# Patient Record
Sex: Female | Born: 1980 | Race: Black or African American | Hispanic: No | Marital: Married | Smoking: Former smoker
Health system: Southern US, Community
[De-identification: ages and names within clinical notes are randomized; demographics above are authoritative.]

## PROBLEM LIST (undated history)

## (undated) ENCOUNTER — Inpatient Hospital Stay (HOSPITAL_COMMUNITY): Payer: Self-pay

## (undated) DIAGNOSIS — I10 Essential (primary) hypertension: Secondary | ICD-10-CM

## (undated) DIAGNOSIS — A749 Chlamydial infection, unspecified: Secondary | ICD-10-CM

## (undated) DIAGNOSIS — A549 Gonococcal infection, unspecified: Secondary | ICD-10-CM

## (undated) DIAGNOSIS — R51 Headache: Secondary | ICD-10-CM

## (undated) DIAGNOSIS — N83209 Unspecified ovarian cyst, unspecified side: Secondary | ICD-10-CM

## (undated) HISTORY — DX: Headache: R51

## (undated) HISTORY — PX: TUBAL LIGATION: SHX77

## (undated) HISTORY — PX: THERAPEUTIC ABORTION: SHX798

---
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
## 2002-08-27 ENCOUNTER — Emergency Department (HOSPITAL_COMMUNITY): Admission: EM | Admit: 2002-08-27 | Discharge: 2002-08-27 | Payer: Self-pay | Admitting: Emergency Medicine

## 2002-08-27 ENCOUNTER — Encounter: Payer: Self-pay | Admitting: Emergency Medicine

## 2002-11-18 ENCOUNTER — Inpatient Hospital Stay (HOSPITAL_COMMUNITY): Admission: AD | Admit: 2002-11-18 | Discharge: 2002-11-18 | Payer: Self-pay | Admitting: Obstetrics

## 2002-11-18 ENCOUNTER — Encounter: Payer: Self-pay | Admitting: Obstetrics and Gynecology

## 2003-01-11 ENCOUNTER — Ambulatory Visit (HOSPITAL_COMMUNITY): Admission: RE | Admit: 2003-01-11 | Discharge: 2003-01-11 | Payer: Self-pay | Admitting: Obstetrics and Gynecology

## 2003-04-14 ENCOUNTER — Inpatient Hospital Stay (HOSPITAL_COMMUNITY): Admission: AD | Admit: 2003-04-14 | Discharge: 2003-04-14 | Payer: Self-pay | Admitting: Obstetrics

## 2003-05-15 ENCOUNTER — Inpatient Hospital Stay (HOSPITAL_COMMUNITY): Admission: AD | Admit: 2003-05-15 | Discharge: 2003-05-15 | Payer: Self-pay | Admitting: Obstetrics

## 2003-05-22 ENCOUNTER — Observation Stay (HOSPITAL_COMMUNITY): Admission: AD | Admit: 2003-05-22 | Discharge: 2003-05-23 | Payer: Self-pay | Admitting: Obstetrics

## 2003-05-23 ENCOUNTER — Encounter: Payer: Self-pay | Admitting: Obstetrics

## 2003-05-27 ENCOUNTER — Inpatient Hospital Stay (HOSPITAL_COMMUNITY): Admission: AD | Admit: 2003-05-27 | Discharge: 2003-05-27 | Payer: Self-pay | Admitting: Obstetrics

## 2003-05-29 ENCOUNTER — Inpatient Hospital Stay (HOSPITAL_COMMUNITY): Admission: AD | Admit: 2003-05-29 | Discharge: 2003-06-02 | Payer: Self-pay | Admitting: Obstetrics

## 2003-05-30 ENCOUNTER — Encounter (INDEPENDENT_AMBULATORY_CARE_PROVIDER_SITE_OTHER): Payer: Self-pay | Admitting: Specialist

## 2003-12-21 ENCOUNTER — Emergency Department (HOSPITAL_COMMUNITY): Admission: EM | Admit: 2003-12-21 | Discharge: 2003-12-21 | Payer: Self-pay | Admitting: Emergency Medicine

## 2005-01-08 ENCOUNTER — Emergency Department (HOSPITAL_COMMUNITY): Admission: EM | Admit: 2005-01-08 | Discharge: 2005-01-08 | Payer: Self-pay | Admitting: Emergency Medicine

## 2006-01-13 ENCOUNTER — Emergency Department (HOSPITAL_COMMUNITY): Admission: EM | Admit: 2006-01-13 | Discharge: 2006-01-14 | Payer: Self-pay | Admitting: Emergency Medicine

## 2006-05-14 ENCOUNTER — Emergency Department (HOSPITAL_COMMUNITY): Admission: EM | Admit: 2006-05-14 | Discharge: 2006-05-14 | Payer: Self-pay | Admitting: Family Medicine

## 2006-06-07 ENCOUNTER — Inpatient Hospital Stay (HOSPITAL_COMMUNITY): Admission: AD | Admit: 2006-06-07 | Discharge: 2006-06-07 | Payer: Self-pay | Admitting: Obstetrics

## 2006-06-12 ENCOUNTER — Inpatient Hospital Stay (HOSPITAL_COMMUNITY): Admission: AD | Admit: 2006-06-12 | Discharge: 2006-06-12 | Payer: Self-pay | Admitting: Gynecology

## 2006-07-16 ENCOUNTER — Inpatient Hospital Stay (HOSPITAL_COMMUNITY): Admission: AD | Admit: 2006-07-16 | Discharge: 2006-07-16 | Payer: Self-pay | Admitting: Obstetrics

## 2006-07-21 ENCOUNTER — Inpatient Hospital Stay (HOSPITAL_COMMUNITY): Admission: AD | Admit: 2006-07-21 | Discharge: 2006-07-21 | Payer: Self-pay | Admitting: Obstetrics

## 2006-09-27 ENCOUNTER — Inpatient Hospital Stay (HOSPITAL_COMMUNITY): Admission: AD | Admit: 2006-09-27 | Discharge: 2006-09-27 | Payer: Self-pay | Admitting: Obstetrics

## 2006-11-03 ENCOUNTER — Inpatient Hospital Stay (HOSPITAL_COMMUNITY): Admission: AD | Admit: 2006-11-03 | Discharge: 2006-11-03 | Payer: Self-pay | Admitting: Obstetrics

## 2006-11-28 ENCOUNTER — Inpatient Hospital Stay (HOSPITAL_COMMUNITY): Admission: AD | Admit: 2006-11-28 | Discharge: 2006-11-28 | Payer: Self-pay | Admitting: Obstetrics

## 2006-12-19 ENCOUNTER — Inpatient Hospital Stay (HOSPITAL_COMMUNITY): Admission: AD | Admit: 2006-12-19 | Discharge: 2006-12-19 | Payer: Self-pay | Admitting: Obstetrics

## 2006-12-30 ENCOUNTER — Inpatient Hospital Stay (HOSPITAL_COMMUNITY): Admission: AD | Admit: 2006-12-30 | Discharge: 2006-12-30 | Payer: Self-pay | Admitting: Obstetrics

## 2007-01-09 ENCOUNTER — Inpatient Hospital Stay (HOSPITAL_COMMUNITY): Admission: AD | Admit: 2007-01-09 | Discharge: 2007-01-12 | Payer: Self-pay | Admitting: Obstetrics

## 2009-04-03 ENCOUNTER — Inpatient Hospital Stay (HOSPITAL_COMMUNITY): Admission: AD | Admit: 2009-04-03 | Discharge: 2009-04-03 | Payer: Self-pay | Admitting: Obstetrics

## 2009-05-27 ENCOUNTER — Emergency Department (HOSPITAL_COMMUNITY): Admission: EM | Admit: 2009-05-27 | Discharge: 2009-05-28 | Payer: Self-pay | Admitting: Emergency Medicine

## 2009-07-28 ENCOUNTER — Emergency Department (HOSPITAL_COMMUNITY): Admission: EM | Admit: 2009-07-28 | Discharge: 2009-07-28 | Payer: Self-pay | Admitting: Emergency Medicine

## 2009-08-26 ENCOUNTER — Emergency Department (HOSPITAL_COMMUNITY): Admission: EM | Admit: 2009-08-26 | Discharge: 2009-08-26 | Payer: Self-pay | Admitting: Emergency Medicine

## 2009-08-30 ENCOUNTER — Emergency Department (HOSPITAL_COMMUNITY): Admission: EM | Admit: 2009-08-30 | Discharge: 2009-08-31 | Payer: Self-pay | Admitting: Emergency Medicine

## 2009-11-09 ENCOUNTER — Inpatient Hospital Stay (HOSPITAL_COMMUNITY): Admission: AD | Admit: 2009-11-09 | Discharge: 2009-11-09 | Payer: Self-pay | Admitting: Obstetrics

## 2010-02-16 ENCOUNTER — Inpatient Hospital Stay (HOSPITAL_COMMUNITY): Admission: AD | Admit: 2010-02-16 | Discharge: 2010-02-17 | Payer: Self-pay | Admitting: Obstetrics

## 2010-05-24 ENCOUNTER — Inpatient Hospital Stay (HOSPITAL_COMMUNITY): Admission: AD | Admit: 2010-05-24 | Discharge: 2010-05-25 | Payer: Self-pay | Admitting: Obstetrics

## 2010-11-06 ENCOUNTER — Inpatient Hospital Stay (HOSPITAL_COMMUNITY)
Admission: AD | Admit: 2010-11-06 | Discharge: 2010-11-06 | Payer: Self-pay | Source: Home / Self Care | Attending: Obstetrics | Admitting: Obstetrics

## 2010-12-04 ENCOUNTER — Inpatient Hospital Stay (HOSPITAL_COMMUNITY)
Admission: AD | Admit: 2010-12-04 | Discharge: 2010-12-04 | Payer: Self-pay | Source: Home / Self Care | Attending: Obstetrics | Admitting: Obstetrics

## 2010-12-06 LAB — URINALYSIS, ROUTINE W REFLEX MICROSCOPIC
Bilirubin Urine: NEGATIVE
Hgb urine dipstick: NEGATIVE
Ketones, ur: NEGATIVE mg/dL
Nitrite: NEGATIVE
Protein, ur: NEGATIVE mg/dL
Specific Gravity, Urine: >1.03 — ABNORMAL HIGH (ref 1.005–1.030)
Urine Glucose, Fasting: NEGATIVE mg/dL
Urobilinogen, UA: 0.2 mg/dL (ref 0.0–1.0)
pH: 5.5 (ref 5.0–8.0)

## 2010-12-06 LAB — WET PREP, GENITAL
Clue Cells Wet Prep HPF POC: NONE SEEN
Trich, Wet Prep: NONE SEEN

## 2010-12-06 LAB — GC/CHLAMYDIA PROBE AMP, GENITAL
Chlamydia, DNA Probe: NEGATIVE
GC Probe Amp, Genital: NEGATIVE

## 2010-12-06 LAB — POCT PREGNANCY, URINE: Preg Test, Ur: NEGATIVE

## 2010-12-25 ENCOUNTER — Emergency Department (HOSPITAL_COMMUNITY)
Admission: EM | Admit: 2010-12-25 | Discharge: 2010-12-25 | Disposition: A | Discharge status: Home / Self Care | Payer: Medicaid Other | Attending: Emergency Medicine | Admitting: Emergency Medicine

## 2010-12-25 DIAGNOSIS — R05 Cough: Secondary | ICD-10-CM | POA: Insufficient documentation

## 2010-12-25 DIAGNOSIS — R059 Cough, unspecified: Secondary | ICD-10-CM | POA: Insufficient documentation

## 2010-12-25 DIAGNOSIS — J029 Acute pharyngitis, unspecified: Secondary | ICD-10-CM | POA: Insufficient documentation

## 2010-12-25 DIAGNOSIS — J3489 Other specified disorders of nose and nasal sinuses: Secondary | ICD-10-CM | POA: Insufficient documentation

## 2010-12-25 LAB — RAPID STREP SCREEN (MED CTR MEBANE ONLY): Streptococcus, Group A Screen (Direct): NEGATIVE

## 2010-12-30 ENCOUNTER — Inpatient Hospital Stay (HOSPITAL_COMMUNITY)
Admission: AD | Admit: 2010-12-30 | Discharge: 2010-12-30 | Disposition: A | Discharge status: Home / Self Care | Payer: Medicaid Other | Source: Ambulatory Visit | Attending: Obstetrics | Admitting: Obstetrics

## 2010-12-30 DIAGNOSIS — B373 Candidiasis of vulva and vagina: Secondary | ICD-10-CM

## 2010-12-30 DIAGNOSIS — B3731 Acute candidiasis of vulva and vagina: Secondary | ICD-10-CM | POA: Insufficient documentation

## 2010-12-30 DIAGNOSIS — N949 Unspecified condition associated with female genital organs and menstrual cycle: Secondary | ICD-10-CM

## 2010-12-30 LAB — URINALYSIS, ROUTINE W REFLEX MICROSCOPIC
Bilirubin Urine: NEGATIVE
Hgb urine dipstick: NEGATIVE
Ketones, ur: NEGATIVE mg/dL
Nitrite: NEGATIVE
Protein, ur: NEGATIVE mg/dL
Specific Gravity, Urine: >1.03 — ABNORMAL HIGH (ref 1.005–1.030)
Urine Glucose, Fasting: NEGATIVE mg/dL
Urobilinogen, UA: 0.2 mg/dL (ref 0.0–1.0)
pH: 6 (ref 5.0–8.0)

## 2010-12-30 LAB — WET PREP, GENITAL
Trich, Wet Prep: NONE SEEN
Yeast Wet Prep HPF POC: NONE SEEN

## 2010-12-30 LAB — POCT PREGNANCY, URINE: Preg Test, Ur: NEGATIVE

## 2011-01-02 LAB — GC/CHLAMYDIA PROBE AMP, GENITAL
Chlamydia, DNA Probe: NEGATIVE
GC Probe Amp, Genital: NEGATIVE

## 2011-01-29 LAB — URINALYSIS, ROUTINE W REFLEX MICROSCOPIC
Bilirubin Urine: NEGATIVE
Glucose, UA: NEGATIVE mg/dL
Hgb urine dipstick: NEGATIVE
Ketones, ur: NEGATIVE mg/dL
Nitrite: NEGATIVE
Protein, ur: NEGATIVE mg/dL
Specific Gravity, Urine: >1.03 — ABNORMAL HIGH (ref 1.005–1.030)
Urobilinogen, UA: 0.2 mg/dL (ref 0.0–1.0)
pH: 6 (ref 5.0–8.0)

## 2011-01-29 LAB — POCT PREGNANCY, URINE: Preg Test, Ur: NEGATIVE

## 2011-01-29 LAB — GC/CHLAMYDIA PROBE AMP, GENITAL
Chlamydia, DNA Probe: NEGATIVE
GC Probe Amp, Genital: NEGATIVE

## 2011-01-29 LAB — WET PREP, GENITAL
Clue Cells Wet Prep HPF POC: NONE SEEN
Trich, Wet Prep: NONE SEEN

## 2011-02-04 LAB — URINALYSIS, ROUTINE W REFLEX MICROSCOPIC
Bilirubin Urine: NEGATIVE
Ketones, ur: NEGATIVE mg/dL
Nitrite: NEGATIVE
Urobilinogen, UA: 0.2 mg/dL (ref 0.0–1.0)
pH: 6 (ref 5.0–8.0)

## 2011-02-09 LAB — GC/CHLAMYDIA PROBE AMP, GENITAL: Chlamydia, DNA Probe: NEGATIVE

## 2011-02-09 LAB — CBC
HCT: 41.1 % (ref 36.0–46.0)
Hemoglobin: 13.7 g/dL (ref 12.0–15.0)
RBC: 4.75 MIL/uL (ref 3.87–5.11)
RDW: 13.9 % (ref 11.5–15.5)
WBC: 11 10*3/uL — ABNORMAL HIGH (ref 4.0–10.5)

## 2011-02-09 LAB — WET PREP, GENITAL
Trich, Wet Prep: NONE SEEN
Yeast Wet Prep HPF POC: NONE SEEN

## 2011-02-09 LAB — URINALYSIS, ROUTINE W REFLEX MICROSCOPIC
Glucose, UA: NEGATIVE mg/dL
Hgb urine dipstick: NEGATIVE
Specific Gravity, Urine: 1.02 (ref 1.005–1.030)
pH: 7 (ref 5.0–8.0)

## 2011-02-09 LAB — POCT PREGNANCY, URINE: Preg Test, Ur: NEGATIVE

## 2011-02-19 LAB — COMPREHENSIVE METABOLIC PANEL
CO2: 29 mEq/L (ref 19–32)
Calcium: 9.2 mg/dL (ref 8.4–10.5)
Creatinine, Ser: 0.81 mg/dL (ref 0.4–1.2)
GFR calc Af Amer: >60 mL/min (ref >60)
GFR calc non Af Amer: >60 mL/min (ref >60)
Glucose, Bld: 102 mg/dL — ABNORMAL HIGH (ref 70–99)

## 2011-02-19 LAB — CBC
MCHC: 34 g/dL (ref 30.0–36.0)
MCV: 86.1 fL (ref 78.0–100.0)
RBC: 4.55 MIL/uL (ref 3.87–5.11)
RDW: 14 % (ref 11.5–15.5)

## 2011-02-19 LAB — WET PREP, GENITAL
Trich, Wet Prep: NONE SEEN
Yeast Wet Prep HPF POC: NONE SEEN

## 2011-02-19 LAB — GC/CHLAMYDIA PROBE AMP, GENITAL: GC Probe Amp, Genital: NEGATIVE

## 2011-02-19 LAB — URINALYSIS, ROUTINE W REFLEX MICROSCOPIC
Bilirubin Urine: NEGATIVE
Ketones, ur: NEGATIVE mg/dL
Nitrite: NEGATIVE
Protein, ur: NEGATIVE mg/dL
pH: 5 (ref 5.0–8.0)

## 2011-02-23 LAB — URINALYSIS, ROUTINE W REFLEX MICROSCOPIC
Bilirubin Urine: NEGATIVE
Glucose, UA: NEGATIVE mg/dL
Hgb urine dipstick: NEGATIVE
Ketones, ur: NEGATIVE mg/dL
pH: 7 (ref 5.0–8.0)

## 2011-02-27 LAB — CBC
Hemoglobin: 14.4 g/dL (ref 12.0–15.0)
MCHC: 34.9 g/dL (ref 30.0–36.0)
RDW: 14.4 % (ref 11.5–15.5)

## 2011-02-27 LAB — DIFFERENTIAL
Basophils Absolute: 0 10*3/uL (ref 0.0–0.1)
Basophils Relative: 0 % (ref 0–1)
Monocytes Absolute: 0.9 10*3/uL (ref 0.1–1.0)
Neutro Abs: 6.6 10*3/uL (ref 1.7–7.7)

## 2011-02-27 LAB — URINALYSIS, ROUTINE W REFLEX MICROSCOPIC
Glucose, UA: NEGATIVE mg/dL
Hgb urine dipstick: NEGATIVE
Specific Gravity, Urine: 1.01 (ref 1.005–1.030)
Urobilinogen, UA: 0.2 mg/dL (ref 0.0–1.0)

## 2011-02-27 LAB — GC/CHLAMYDIA PROBE AMP, GENITAL: GC Probe Amp, Genital: NEGATIVE

## 2011-02-27 LAB — WET PREP, GENITAL
Trich, Wet Prep: NONE SEEN
Yeast Wet Prep HPF POC: NONE SEEN

## 2011-04-06 NOTE — Op Note (Signed)
[facility]  Patient:    Carmen Watts, Carmen Watts Visit Number: [id number] MRN: [id number]          Service Type: Attending:  Kathreen Cosier, M.D. Dictated by:   Kathreen Cosier, M.D. Proc. Date: 11/11/01                             Operative Report  PREOPERATIVE DIAGNOSES:       1.Prolonged ruptured membranes.                               2. Failed Pitocin induction.  POSTOPERATIVE DIAGNOSIS:  OPERATION:                    Low transverse cesarean section.  SURGEON:                      Kathreen Cosier, M.D.  ANESTHESIA:                   Spinal.  DESCRIPTION OF PROCEDURE:     The patient was placed on the operating table in supine position. The abdomen was prepped and draped and the bladder emptied with a Foley catheter. A transverse suprapubic incision was made and carried down to the rectus fascia. The fascia was cleanly incised the length of the incision. The rectus muscles were retracted laterally. The peritoneum was incised longitudinally. A transverse incision was made in the visceral peritoneum above the bladder and the bladder mobilized inferiorly. A transverse lower uterine incision was made. The patient delivered from the LOA position of a female, Apgars 8/9, weighing 6 pounds 9 ounces. The fluid was clear. The placenta was posterior and removed manually. The uterine cavity was cleaned with dry laps. The uterine incision was closed in one layer with continuous suture of #1 Dexon. The bladder flap was reattached with 2-0 chromic. The uterus was well contracted. Tubes and ovaries were normal. The abdomen was closed in layers: the peritoneum with continuous suture of 0 chromic; the fascia with continuous suture of 0 Dexon, and the skin closed with subcuticular stitches of 3-0 plain. The patient tolerated the procedure well and was taken to the recovery room in good condition. Dictated by:   Kathreen Cosier, M.D. Attending:   Kathreen Cosier, M.D. DD:  11/11/01 TD:  11/12/01 Job: [id number]

## 2011-04-06 NOTE — H&P (Signed)
   NAME:  Carmen Watts, Carmen Watts                           ACCOUNT NO.:  [id number]   MEDICAL RECORD NO.:  [id number]                   PATIENT TYPE:  INP   LOCATION:  9144                                 [facility]ILITY:  [facility]   PHYSICIAN:  Kathreen Cosier, M.D.           DATE OF BIRTH:  [birth year]   DATE OF ADMISSION:  05/29/2003  DATE OF DISCHARGE:                                HISTORY & PHYSICAL   HISTORY OF PRESENT ILLNESS:  The patient is a 30 year old gravida 2, para 1-  0-0-1 had a previous C-section with CPD.  Her EDC was July 13th to June 08, 2003.  The patient was admitted in early labor, cervix was 2 cm, 70%,  vertex, minus 2, negative GBS.  Her membranes were ruptured at 11:05 p.m.  No fluid obtained.  IUPC was inserted.  She was started on a low dose of  Pitocin.  The cervix was 2 cm, 80%, vertex, minus __________.  By 3 a.m. on  July 11th was still contracting every 1 to 3 minutes with Pitocin.  The  patient said she had a headache, that her epidural did not work and demanded  a repeat C-section.  So it was decided should perform a repeat C-section at  the patient's request.   PHYSICAL EXAMINATION:  HEENT:  Negative.  LUNGS:  Clear.  HEART:  Regular rhythm, no murmurs, no gallops.  ABDOMEN:  Term sized uterus.  Estimated fetal weight 7 pounds.  PELVIC:  Cervix as described above.  EXTREMITIES:  Negative.                                               Kathreen Cosier, M.D.    BAM/MEDQ  D:  05/30/2003  T:  05/30/2003  Job:  [id number]

## 2011-04-06 NOTE — Op Note (Signed)
   Carmen Watts, Carmen Watts                           ACCOUNT NO.:  [id number]   MEDICAL RECORD NO.:  [id number]                   PATIENT TYPE:  INP   LOCATION:  9144                                 [facility]  [facility]   PHYSICIAN:  Kathreen Cosier, M.D.           DATE OF BIRTH:  24-Sep-1981   DATE OF PROCEDURE:  05/30/2003  DATE OF DISCHARGE:                                 OPERATIVE REPORT   PREOPERATIVE DIAGNOSES:  1. Previous cesarean section at term.  2. Active labor.  3. Patient with ________.   ANESTHESIA:  Spinal.   SURGEON:  Kathreen Cosier, M.D.   PROCEDURE:  The patient is on the operating table in the supine position  after the spinal administered.  Abdomen prepped and draped, bladder emptied  with a Foley catheter.  A transverse suprapubic incision made.  This was  carried down to the rectus fascia.  The fascia is cleaned and incised the  length of the incision, the recti muscles retracted laterally.  The  peritoneum was incised longitudinally.  A transverse incision made on the  visceral peritoneum above the bladder and the bladder mobilized inferiorly.  A transverse lower uterine incision made.  Fluid was clear.  The patient was  delivered from the OP position of a female, Apgars 8 and 9, at 3:20 a.m.  weighing 6 pounds 8 ounces.  The placenta was anterior, removed manually.  The uterine cavity cleaned with dry laps.  Uterine incision closed in one  layer with a continuous suture of #1 chromic.  The bladder flap reattached  with 2-0 chromic.  Uterus well-contracted.  Tubes and ovaries normal.  Abdomen closed in layers, the perineum with a continuous suture of 0  chromic, fascia with a continuous suture of 0 Dexon, skin closed with  subcuticular stitch of 3-0 plain.  Blood loss was 600 mL.                                               Kathreen Cosier, M.D.    BAM/MEDQ  D:  05/30/2003  T:  05/31/2003  Job:  [id number]

## 2011-04-06 NOTE — Discharge Summary (Signed)
[facility]  Patient:    Carmen Watts, Carmen Watts Visit Number: [id number] MRN: [id number]          Service Type: OBS Location: 910A 9135 01 Attending Physician:  Venita Sheffield Dictated by:   Kathreen Cosier, M.D. Admit Date:  11/10/2001 Discharge Date: 11/14/2001                             Discharge Summary  HISTORY OF PRESENT ILLNESS:   The patient is an 30 year old primigravida, [facility] November 15, 2001, with ruptured membranes which occurred at 7:30 p.m. on the night of November 10, 2001.  Fluid was clear on the morning of November 11, 2001.  She was 2 cm, 60% vertex, -2 station.  She had been on Pitocin from admission the night prior to the 24th.  Throughout the course of the day, the patient had her Pitocin advanced and she was having mild contractions which she did not feel.  She was up to 32 mg of Pitocin by 10 p.m. on September 11, 2001 with prolonged ruptured membranes, and not in labor.  Cervix was unchanged.  The contractions were mild.  The patient did not feel them.  She was delivered by a low transverse cesarean section because of prolonged ruptured membranes and failed Pitocin induction.  She had a 6 pound 9 ounce.  Apgars were 8 and 9.  On admission, her hemoglobin was 11.6.  Postoperatively, it was 10.2.  She was discharged home on the third postoperative day and returned regular time with Tylox 1 p.o. q.4h. p.r.n. and ferrous sulfate 325 mg.  She is to see me in six weeks.  DISCHARGE DIAGNOSIS:                        Status post primary low transverse cesarean section. with a prolonged ruptured membranes and failed induction of labor. Dictated by:   Kathreen Cosier, M.D. Attending Physician:  Venita Sheffield DD:  11/14/01 TD:  11/14/01 Job: [id number]

## 2011-04-06 NOTE — Discharge Summary (Signed)
NAMEKIERSTYN, BARANOWSKI                 ACCOUNT NO.:  [id number]   MEDICAL RECORD NO.:  [id number]          PATIENT TYPE:  INP   LOCATION:  9132                          [facility]ILITY:  [facility]   PHYSICIAN:  Kathreen Cosier, M.D.DATE OF BIRTH:  [birth year]   DATE OF ADMISSION:  01/09/2007  DATE OF DISCHARGE:  01/12/2007                               DISCHARGE SUMMARY   HISTORY OF PRESENT ILLNESS:  The patient is a 30 year old gravida 3,  para 2, EDC January 17, 2007.  She had two previous C-sections.  She  was in for a repeat C-section at term.  She had a history of premature  contractions and was treated with Procardia 60 XL.  The patient  underwent repeat low transverse cesarean section.  She had a female,  Apgars 9 and 9, weight being 7 pounds 2 ounces.  Postoperatively, she  did well.   ADMISSION LABORATORY DATA:  Hemoglobin 11.9, platelets 225, postop  hemoglobin 10, platelets 209, PT/PTT normal.  Sodium 132, potassium 3.5,  chloride 101.   DISPOSITION:  She was discharged home on postop day #3 and to return  back to see me in six weeks.   DISCHARGE DIAGNOSES:  Repeat low transverse cesarean section at term.           ______________________________  Kathreen Cosier, M.D.     BAM/MEDQ  D:  01/29/2007  T:  01/30/2007  Job:  [id number]

## 2011-04-06 NOTE — Discharge Summary (Signed)
   [name], Carmen Watts                           ACCOUNT NO.:  [id number]   MEDICAL RECORD NO.:  [id number]                   PATIENT TYPE:  INP   LOCATION:  9144                                 [facility]   PHYSICIAN:  Kathreen Cosier, M.D.           DATE OF BIRTH:  17-Oct-1981   DATE OF ADMISSION:  05/29/2003  DATE OF DISCHARGE:  06/02/2003                                 DISCHARGE SUMMARY   HISTORY AND PHYSICAL:  The patient is a 30 year old gravida 2, para 1-0-0-1  with a cesarean section in the past with CPD.  Her EDC was June 01, 2003 to  June 10, 2003.  She had been contracting in prodromal labor for the past  week.  She was admitted 2 cm, 70%, vertex -2 to -3.  Hemoglobin 11.4,  platelets 254.  Membranes artificially ruptured at 11 p.m., and IUPC  inserted.  The patient was started on low-dose Pitocin by 3, and the patient  had had her epidural; however, she was still 2 cm.  The patient stated that  she wanted a repeat cesarean section, she did not want to labor, so she had  a spinal and a repeat low transverse cesarean section.  She had a female  from the OP position, Apgars 8/9, weighing 6 pounds, 8 ounces.  Postoperative hemoglobin was 9.9.  Urine was negative.  She did well and was  discharged on the third postoperative day, ambulatory, on a regular diet.   FOLLOW UP:  She is to see me in six weeks.   DISCHARGE MEDICATIONS:  1. Tylenol No. 3 for pain.  2. Yasmin for contraception.   DISCHARGE DIAGNOSES:  Status post repeat low transverse cesarean section at  term at the patient's request.                                               Kathreen Cosier, M.D.    BAM/MEDQ  D:  06/02/2003  T:  06/02/2003  Job:  [id number]

## 2011-04-06 NOTE — H&P (Signed)
[facility]  Patient:    Carmen Watts, Carmen Watts Visit Number: [id number] MRN: [id number]          Service Type: OBS Location: 910A 9135 01 Attending Physician:  Venita Sheffield Dictated by:   Kathreen Cosier, M.D. Proc. Date: 11/10/01 Admit Date:  11/10/2001                           History and Physical  HISTORY OF PRESENT ILLNESS:   The patient is an 30 year old primigravida, EDC of November 15, 2001 with ruptured membranes which occurred at 7:30 p.m. on November 10, 2001.  HOSPITAL COURSE:              In the a.m. of November 11, 2001 the cervix was 2 cm, 60%, vertex, 0 station. She was already on 20 milliunits of Pitocin. Throughout the course of the day and into the night of December 24, she was on 32 milliunits of Pitocin and contracting two to three minutes but they were ineffective and by 10 p.m. on December 24, she was still 2 cm. It was decided that she would deliver by cesarean section because of prolonged ruptured membranes greater than 24 hours and failed induction of labor.  PHYSICAL EXAMINATION:  GENERAL:  Well-developed female in no distress.  HEENT:  Negative.  LUNGS:  Clear.  HEART:  Regular rate with no murmurs or gallops.  ABDOMEN:  Term size uterus. Estimated fetal weight of 7 pounds.  EXTREMITIES:  As described above. Dictated by:   Kathreen Cosier, M.D. Attending Physician:  Venita Sheffield DD:  11/11/01 TD:  11/12/01 Job: [id number]

## 2011-04-06 NOTE — Op Note (Signed)
NAMEESTORIA, Carmen Watts                 ACCOUNT NO.:  [id number]   MEDICAL RECORD NO.:  [id number]          PATIENT TYPE:  INP   LOCATION:  NA                            [facility]ILITY:  [facility]   PHYSICIAN:  Kathreen Cosier, M.D.DATE OF BIRTH:  1981/06/27   DATE OF PROCEDURE:  01/09/2007  DATE OF DISCHARGE:                               OPERATIVE REPORT   PREOPERATIVE DIAGNOSES:  1. Previous cesarean section.  2. At term, for repeat cesarean section.   SURGEON:  Kathreen Cosier, M.D.   FIRST ASSISTANT:  Charles A. Clearance Coots, M.D.   ANESTHESIA:  Spinal.   DESCRIPTION OF PROCEDURE:  The patient was placed on the operating table  in the supine position after the spinal was administered, abdomen  prepped and draped, bladder emptied with a Foley catheter.  A transverse  suprapubic incision was made through the old scar and carried down to  the rectus fascia, fascia cleaned and incised the length of the  incision, rectus muscles retracted laterally, peritoneum incised  longitudinally.  Transverse incision was made in the visceroperitoneum  above the bladder and bladder mobilized inferiorly.  A transverse lower  uterine incision was made.  Fluid was clear.  The patient was delivered  from LOA position of a female, Apgars 9/9, weighing 7 pounds 2 ounces.  Team was in attendance.  The placenta was anterior, removed manually and  sent to Labor and Delivery.  Uterine cavity was cleaned with dry laps  and uterine incision closed in 1 layer with continuous suture of #1  chromic, hemostasis satisfactory.  Bladder flap was reattached with 2-0  chromic.  Uterus was well-contracted.  Tubes and ovaries were normal.  The abdomen was closed in layers, the peritoneum with a continuous  suture of 0 chromic, fascia with a continuous suture of 0 Dexon and the  skin closed with a subcuticular stitch of 4-0 Monocryl.   BLOOD LOSS:  500 mL.           ______________________________  Kathreen Cosier,  M.D.     BAM/MEDQ  D:  01/09/2007  T:  01/09/2007  Job:  [id number]

## 2011-05-14 ENCOUNTER — Emergency Department (HOSPITAL_COMMUNITY): Payer: Medicaid Other

## 2011-05-14 ENCOUNTER — Emergency Department (HOSPITAL_COMMUNITY)
Admission: EM | Admit: 2011-05-14 | Discharge: 2011-05-14 | Disposition: A | Discharge status: Home / Self Care | Payer: Medicaid Other | Attending: Emergency Medicine | Admitting: Emergency Medicine

## 2011-05-14 DIAGNOSIS — Y92410 Unspecified street and highway as the place of occurrence of the external cause: Secondary | ICD-10-CM | POA: Insufficient documentation

## 2011-05-14 DIAGNOSIS — S7000XA Contusion of unspecified hip, initial encounter: Secondary | ICD-10-CM | POA: Insufficient documentation

## 2011-05-14 DIAGNOSIS — M25559 Pain in unspecified hip: Secondary | ICD-10-CM | POA: Insufficient documentation

## 2011-05-14 DIAGNOSIS — T148XXA Other injury of unspecified body region, initial encounter: Secondary | ICD-10-CM | POA: Insufficient documentation

## 2011-05-14 LAB — URINALYSIS, ROUTINE W REFLEX MICROSCOPIC
Bilirubin Urine: NEGATIVE
Hgb urine dipstick: NEGATIVE
Specific Gravity, Urine: 1.01 (ref 1.005–1.030)
Urobilinogen, UA: 1 mg/dL (ref 0.0–1.0)
pH: 6.5 (ref 5.0–8.0)

## 2011-06-29 ENCOUNTER — Emergency Department (HOSPITAL_COMMUNITY)
Admission: EM | Admit: 2011-06-29 | Discharge: 2011-06-29 | Disposition: A | Discharge status: Home / Self Care | Payer: Medicaid Other | Attending: Emergency Medicine | Admitting: Emergency Medicine

## 2011-06-29 ENCOUNTER — Emergency Department (HOSPITAL_COMMUNITY): Payer: Medicaid Other

## 2011-06-29 DIAGNOSIS — M545 Low back pain, unspecified: Secondary | ICD-10-CM | POA: Insufficient documentation

## 2011-06-29 DIAGNOSIS — J45909 Unspecified asthma, uncomplicated: Secondary | ICD-10-CM | POA: Insufficient documentation

## 2011-08-06 ENCOUNTER — Encounter (HOSPITAL_COMMUNITY): Payer: Self-pay

## 2011-08-06 ENCOUNTER — Inpatient Hospital Stay (HOSPITAL_COMMUNITY)
Admission: AD | Admit: 2011-08-06 | Discharge: 2011-08-06 | Disposition: A | Discharge status: Home / Self Care | Payer: Self-pay | Source: Ambulatory Visit | Attending: Obstetrics & Gynecology | Admitting: Obstetrics & Gynecology

## 2011-08-06 DIAGNOSIS — B3731 Acute candidiasis of vulva and vagina: Secondary | ICD-10-CM

## 2011-08-06 DIAGNOSIS — R7302 Impaired glucose tolerance (oral): Secondary | ICD-10-CM

## 2011-08-06 DIAGNOSIS — B373 Candidiasis of vulva and vagina: Secondary | ICD-10-CM

## 2011-08-06 DIAGNOSIS — E739 Lactose intolerance, unspecified: Secondary | ICD-10-CM | POA: Insufficient documentation

## 2011-08-06 HISTORY — DX: Unspecified ovarian cyst, unspecified side: N83.209

## 2011-08-06 HISTORY — DX: Gonococcal infection, unspecified: A54.9

## 2011-08-06 LAB — WET PREP, GENITAL: Trich, Wet Prep: NONE SEEN

## 2011-08-06 LAB — POCT PREGNANCY, URINE: Preg Test, Ur: NEGATIVE

## 2011-08-06 MED ORDER — FLUCONAZOLE 150 MG PO TABS: 150.0000 mg | ORAL_TABLET | Freq: Once | ORAL | Status: AC

## 2011-08-06 NOTE — Progress Notes (Signed)
Left lower abd pain, possible yeast infection, 6 month ago had same and here in [facility]U several times for Rx. Terconazole cream and 4 pills

## 2011-08-06 NOTE — ED Provider Notes (Signed)
Carmen Watts is a 30 y.o. [phone]  Chief Complaint  Patient presents with  . Vaginal Discharge  . Vaginal Itching    with rash  history of present illness:  She reports several days of thick white vaginal discharge and vulvovaginal pleuritis. She used 2 tubes of Monistat 7 without improvement. She has had several yeast infections in the past.  The last time it took multiple doses of Diflucan to clear it up.She has never been tested for diabetes and is fasting since last evening. She also reports some mild left lower quadrant pain has been on-and-off for 3 days but is not present now. Past Medical History  Diagnosis Date  . Asthma   . Ovarian cyst   . Gonorrhea    Past Surgical History  Procedure Date  . Cesarean section    History   Social History  . Marital Status: Single    Spouse Name: N/A    Number of Children: N/A  . Years of Education: N/A   Occupational History  . Not on file.   Social History Main Topics  . Smoking status: Never Smoker   . Smokeless tobacco: Not on file  . Alcohol Use: Yes  . Drug Use: No  . Sexually Active: Yes   Other Topics Concern  . Not on file   Social History Narrative  . No narrative on file   Results for orders placed during the hospital encounter of 08/06/11 (from the past 24 hour(s))  POCT PREGNANCY, URINE     Status: Normal   Collection Time   08/06/11  9:51 AM      Component Value Range   Preg Test, Ur NEGATIVE    GLUCOSE, CAPILLARY     Status: Abnormal   Collection Time   08/06/11 10:17 AM      Component Value Range   Glucose-Capillary 128 (*) 70 - 99 (mg/dL)  Microscopic wet-mount exam shows mod yeast

## 2011-08-07 LAB — GC/CHLAMYDIA PROBE AMP, GENITAL: GC Probe Amp, Genital: NEGATIVE

## 2011-08-10 NOTE — ED Provider Notes (Signed)
Agree with above note.  Belkys Henault H. 08/10/2011 8:29 AM

## 2011-10-17 ENCOUNTER — Ambulatory Visit (INDEPENDENT_AMBULATORY_CARE_PROVIDER_SITE_OTHER): Payer: No Typology Code available for payment source | Admitting: Internal Medicine

## 2011-10-17 ENCOUNTER — Encounter: Payer: Self-pay | Admitting: Internal Medicine

## 2011-10-17 VITALS — BP 120/72 | HR 98 | Temp 98.3°F | Wt 261.7 lb

## 2011-10-17 DIAGNOSIS — R7302 Impaired glucose tolerance (oral): Secondary | ICD-10-CM | POA: Insufficient documentation

## 2011-10-17 DIAGNOSIS — R739 Hyperglycemia, unspecified: Secondary | ICD-10-CM

## 2011-10-17 DIAGNOSIS — R7309 Other abnormal glucose: Secondary | ICD-10-CM

## 2011-10-17 DIAGNOSIS — IMO0002 Reserved for concepts with insufficient information to code with codable children: Secondary | ICD-10-CM

## 2011-10-17 DIAGNOSIS — M792 Neuralgia and neuritis, unspecified: Secondary | ICD-10-CM | POA: Insufficient documentation

## 2011-10-17 LAB — LIPID PANEL
HDL: 51 mg/dL (ref >39)
Total CHOL/HDL Ratio: 3.3 Ratio
Triglycerides: 151 mg/dL — ABNORMAL HIGH (ref <150)

## 2011-10-17 LAB — POCT URINALYSIS DIPSTICK
Bilirubin, UA: NEGATIVE
Blood, UA: NEGATIVE
Glucose, UA: NEGATIVE
Spec Grav, UA: 1.02
pH, UA: 7

## 2011-10-17 LAB — POCT GLYCOSYLATED HEMOGLOBIN (HGB A1C): Hemoglobin A1C: 6.5

## 2011-10-17 LAB — TSH: TSH: 1.831 u[IU]/mL (ref 0.350–4.500)

## 2011-10-17 LAB — GLUCOSE, CAPILLARY: Glucose-Capillary: 122 mg/dL — ABNORMAL HIGH (ref 70–99)

## 2011-10-17 NOTE — Assessment & Plan Note (Addendum)
Hemoglobin 6.5 today with CBG equal to 122. Will refer to diabetic educator for nutritional education and defer medication at this point. We'll also check a lipid panel and TSH today given the patient's complaints of paresthesias and obesity. Polycystic ovarian syndrome less likely given patient physical exam and lack of hirsutism, in addition to having 3 children 59, 44, and 28 years old. Patient will follow up in 3 months.

## 2011-10-17 NOTE — Progress Notes (Signed)
Addended by: Maura Crandall on: 10/17/2011 05:04 PM   Modules accepted: Orders

## 2011-10-17 NOTE — Progress Notes (Signed)
  Subjective:    Patient ID: Carmen Watts, female    DOB: [birth year], 30 y.o.   MRN: [id number]  HPI  Reports increased thirst, urination for the past "few months" associated with recurrent yeast infection.  Seen at [facility] where per pt report "sugars over 200 two times" thus presents for further evaluation.  Denies weight loss but reports fatigue, tingling in hands and feet. CBG today 122.  Last CBG 08/06/2011 128.  Review of Systems  Constitutional: Negative for fever, activity change, appetite change and unexpected weight change.  HENT: Negative.   Eyes: Positive for visual disturbance.       Spots in front of her eyes 2-3 times a week, last seen by eye doc 2 years ago   Respiratory: Negative for shortness of breath.   Cardiovascular: Negative for chest pain.  Gastrointestinal: Negative for abdominal distention.  Genitourinary: Negative for dysuria, hematuria, vaginal bleeding and vaginal discharge.  Musculoskeletal: Negative for back pain and joint swelling.  Neurological: Positive for light-headedness and numbness. Negative for dizziness, syncope and weakness.       Migraines 2-3 times a month managed with Excedrin or Advil  Migraine; reports occasional numbness and tingling in her fingertips and feet  Psychiatric/Behavioral: Positive for confusion. Negative for dysphoric mood and agitation.       Objective:   Physical Exam  Constitutional: She is oriented to person, place, and time. She appears well-developed and well-nourished. No distress.       obese  HENT:  Head: Normocephalic and atraumatic.  Eyes: EOM are normal. Pupils are equal, round, and reactive to light.  Neck: Normal range of motion. Neck supple. No thyromegaly present.  Cardiovascular: Normal rate, regular rhythm, normal heart sounds and intact distal pulses.   Pulmonary/Chest: Effort normal and breath sounds normal.  Abdominal: Soft. Bowel sounds are normal. She exhibits mass. There is no tenderness.     obese  Musculoskeletal: Normal range of motion. She exhibits no edema.  Neurological: She is alert and oriented to person, place, and time.  Skin: Skin is warm and dry.       No striae, no hirsutism noted  Psychiatric: She has a normal mood and affect. Her behavior is normal. Judgment and thought content normal.          Assessment & Plan:  #1 impaired glucose tolerance: Will refer to diabetic educator for nutritional management and defer medication at this time. Plan to check a lipid panel and TSH today. Patient reports that her eye doctor is at [facility] and will be making an appointment for eye exam. Patient to followup in clinic in 3 months.

## 2011-10-17 NOTE — Assessment & Plan Note (Signed)
Sensation and gross motor function intact. Likely secondary to hyperglycemia. We'll check TSH today given mother's history of Graves' disease.

## 2011-10-17 NOTE — Patient Instructions (Signed)
It was a pleasure to meet you today, Carmen Watts. You have impaired glucose tolerance/pre--diabetes. A diabetic educator will be contacting you for an appointment.I will defer medication at this time and check your cholesterol and thyroid function with lab work. Return to see me in about 3 months so that we may evaluate your lab work and see how you've been doing after working with the diabetic educator.

## 2011-11-14 ENCOUNTER — Encounter (HOSPITAL_COMMUNITY): Payer: Self-pay | Admitting: Emergency Medicine

## 2011-11-14 ENCOUNTER — Emergency Department (HOSPITAL_COMMUNITY)
Admission: EM | Admit: 2011-11-14 | Discharge: 2011-11-14 | Disposition: A | Discharge status: Home / Self Care | Payer: Medicaid Other | Attending: Emergency Medicine | Admitting: Emergency Medicine

## 2011-11-14 ENCOUNTER — Emergency Department (HOSPITAL_COMMUNITY): Payer: Medicaid Other

## 2011-11-14 DIAGNOSIS — S8990XA Unspecified injury of unspecified lower leg, initial encounter: Secondary | ICD-10-CM | POA: Insufficient documentation

## 2011-11-14 DIAGNOSIS — M25569 Pain in unspecified knee: Secondary | ICD-10-CM | POA: Insufficient documentation

## 2011-11-14 DIAGNOSIS — S99929A Unspecified injury of unspecified foot, initial encounter: Secondary | ICD-10-CM | POA: Insufficient documentation

## 2011-11-14 DIAGNOSIS — X500XXA Overexertion from strenuous movement or load, initial encounter: Secondary | ICD-10-CM | POA: Insufficient documentation

## 2011-11-14 MED ORDER — OXYCODONE-ACETAMINOPHEN 5-325 MG PO TABS
1.0000 | ORAL_TABLET | Freq: Once | ORAL | Status: AC
  Administered 2011-11-14: 1 via ORAL
  Filled 2011-11-14: qty 1

## 2011-11-14 MED ORDER — OXYCODONE-ACETAMINOPHEN 5-325 MG PO TABS: 1.0000 | ORAL_TABLET | Freq: Four times a day (QID) | ORAL | Status: AC | PRN
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
## 2011-11-14 NOTE — ED Notes (Signed)
Pt presented to the ER with c/o left knee pain, states that started around 2000 last night, pt further explains that she fell on the couch and the she "heard the pop" in the left knee, pt also explaining that she was trying to wait ans see if the pain will subside however s/s are getting worse. Notes minimal swelling to the left knee,also feel warmer to the touch then right knee, no apparent deformity at this time, pt also states not able to bear WT to the left knee at this time.

## 2011-11-15 NOTE — ED Provider Notes (Signed)
Medical screening examination/treatment/procedure(s) were performed by non-physician practitioner and as supervising physician I was immediately available for consultation/collaboration.   Dione Booze, MD 11/15/11 [phone]

## 2011-12-12 ENCOUNTER — Encounter: Payer: No Typology Code available for payment source | Admitting: Dietician

## 2012-05-03 ENCOUNTER — Inpatient Hospital Stay (HOSPITAL_COMMUNITY)
Admission: AD | Admit: 2012-05-03 | Discharge: 2012-05-03 | Disposition: A | Discharge status: Home / Self Care | Payer: Medicaid Other | Source: Ambulatory Visit | Attending: Obstetrics & Gynecology | Admitting: Obstetrics & Gynecology

## 2012-05-03 ENCOUNTER — Encounter (HOSPITAL_COMMUNITY): Payer: Self-pay | Admitting: Obstetrics and Gynecology

## 2012-05-03 DIAGNOSIS — B373 Candidiasis of vulva and vagina: Secondary | ICD-10-CM | POA: Insufficient documentation

## 2012-05-03 DIAGNOSIS — N760 Acute vaginitis: Secondary | ICD-10-CM

## 2012-05-03 DIAGNOSIS — N912 Amenorrhea, unspecified: Secondary | ICD-10-CM

## 2012-05-03 DIAGNOSIS — B9689 Other specified bacterial agents as the cause of diseases classified elsewhere: Secondary | ICD-10-CM

## 2012-05-03 DIAGNOSIS — A499 Bacterial infection, unspecified: Secondary | ICD-10-CM | POA: Insufficient documentation

## 2012-05-03 DIAGNOSIS — B379 Candidiasis, unspecified: Secondary | ICD-10-CM

## 2012-05-03 DIAGNOSIS — B3731 Acute candidiasis of vulva and vagina: Secondary | ICD-10-CM | POA: Insufficient documentation

## 2012-05-03 DIAGNOSIS — N949 Unspecified condition associated with female genital organs and menstrual cycle: Secondary | ICD-10-CM | POA: Insufficient documentation

## 2012-05-03 DIAGNOSIS — R7309 Other abnormal glucose: Secondary | ICD-10-CM | POA: Insufficient documentation

## 2012-05-03 LAB — WET PREP, GENITAL

## 2012-05-03 LAB — URINALYSIS, ROUTINE W REFLEX MICROSCOPIC
Bilirubin Urine: NEGATIVE
Glucose, UA: NEGATIVE mg/dL
Hgb urine dipstick: NEGATIVE
Ketones, ur: NEGATIVE mg/dL
Leukocytes, UA: NEGATIVE
Nitrite: NEGATIVE
Protein, ur: NEGATIVE mg/dL
Specific Gravity, Urine: 1.02 (ref 1.005–1.030)
Urobilinogen, UA: 0.2 mg/dL (ref 0.0–1.0)
pH: 6.5 (ref 5.0–8.0)

## 2012-05-03 MED ORDER — METRONIDAZOLE 500 MG PO TABS: 500.0000 mg | ORAL_TABLET | Freq: Two times a day (BID) | ORAL | Status: AC

## 2012-05-03 MED ORDER — FLUCONAZOLE 150 MG PO TABS
150.0000 mg | ORAL_TABLET | Freq: Once | ORAL | Status: AC
  Administered 2012-05-03: 150 mg via ORAL
  Filled 2012-05-03: qty 1

## 2012-05-03 MED ORDER — NYSTATIN-TRIAMCINOLONE 100000-0.1 UNIT/GM-% EX CREA: TOPICAL_CREAM | CUTANEOUS | Status: DC

## 2012-05-03 NOTE — MAU Note (Signed)
Recurrent yeast infections, vaginal discharge, wants to be checked out for STD, thinks LMP was in April

## 2012-05-03 NOTE — Discharge Instructions (Signed)
Bacterial Vaginosis Bacterial vaginosis is an infection of the vagina. A healthy vagina has many kinds of good germs (bacteria). Sometimes the number of good germs can change. This allows bad germs to move in and cause an infection. You may be given medicine (antibiotics) to treat the infection. Or, you may not need treatment at all. HOME CARE  Take your medicine as told. Finish them even if you start to feel better.   Do not have sex until you finish your medicine.   Do not douche.   Practice safe sex.   Tell your sex partner that you have an infection. They should see their doctor for treatment if they have problems.  GET HELP RIGHT AWAY IF:  You do not get better after 3 days of treatment.   You have grey fluid (discharge) coming from your vagina.   You have pain.   You have a temperature of 102 F (38.9 C) or higher.  MAKE SURE YOU:   Understand these instructions.   Will watch your condition.   Will get help right away if you are not doing well or get worse.  Document Released: 08/14/2008 Document Revised: 10/25/2011 Document Reviewed: 08/14/2008 ExitCare Patient Information 2012 [employer].Bacterial Vaginosis Bacterial vaginosis is an infection of the vagina. A healthy vagina has many kinds of good germs (bacteria). Sometimes the number of good germs can change. This allows bad germs to move in and cause an infection. You may be given medicine (antibiotics) to treat the infection. Or, you may not need treatment at all. HOME CARE  Take your medicine as told. Finish them even if you start to feel better.   Do not have sex until you finish your medicine.   Do not douche.   Practice safe sex.   Tell your sex partner that you have an infection. They should see their doctor for treatment if they have problems.  GET HELP RIGHT AWAY IF:  You do not get better after 3 days of treatment.   You have grey fluid (discharge) coming from your vagina.   You have pain.    You have a temperature of 102 F (38.9 C) or higher.  MAKE SURE YOU:   Understand these instructions.   Will watch your condition.   Will get help right away if you are not doing well or get worse.  Document Released: 08/14/2008 Document Revised: 10/25/2011 Document Reviewed: 08/14/2008 ExitCare Patient Information 2012 [employer]. 

## 2012-05-03 NOTE — MAU Provider Note (Signed)
Attestation of Attending Supervision of Advanced Practitioner: Evaluation and management procedures were performed by the OB Fellow/PA/CNM/NP under my supervision and collaboration. Chart reviewed, and agree with management and plan.  Alla Sloma, M.D. 05/03/2012 2:50 PM   

## 2012-05-03 NOTE — MAU Note (Signed)
"  I haven't had a period in 2 months.   Me and my boyfriend have been having problems and I want to be checked out for STIs."

## 2012-05-03 NOTE — MAU Provider Note (Signed)
History     CSN: [id number]  Arrival date & time 05/03/12  1207   None     Chief Complaint  Patient presents with  . Vaginal Discharge  . Amenorrhea    HPI Carmen Watts is a 31 y.o. female who presents to MAU for vaginal irritation and irritation to the inner aspect of both thighs. The symptoms started a week ago and she used OTC yeast cream without relief. She uses no birth control. Current sex partner x 5 years but request testing for STI's today. Hx of chlamydia. LMP over 2 months ago but history of irregular menses. Last pap smear at [facility] about a year ago and was normal. Patient is in the process of finding a PCP (with help of [facility]) for elevated blood glucose.  The history was provided by the patient.  Past Medical History  Diagnosis Date  . Asthma   . Ovarian cyst   . Gonorrhea     Past Surgical History  Procedure Date  . Cesarean section     x3    Family History  Problem Relation Age of Onset  . Thyroid disease Mother   . Diabetes Father   . Hypertension Father   . Cancer Maternal Grandmother   . Cancer Maternal Grandfather     History  Substance Use Topics  . Smoking status: Current Everyday Smoker -- 0.5 packs/day for 2 years    Types: Cigarettes  . Smokeless tobacco: Not on file  . Alcohol Use: Yes    OB History    Grav Para Term Preterm Abortions TAB SAB Ect Mult Living   4 3 3  1 1    3       Review of Systems  Constitutional: Negative for fever, chills, diaphoresis and fatigue.  HENT: Negative for ear pain, congestion, sore throat, facial swelling, neck pain, neck stiffness, dental problem and sinus pressure.   Eyes: Negative for photophobia, pain and discharge.  Respiratory: Negative for cough, chest tightness and wheezing.   Cardiovascular: Negative for chest pain and palpitations.  Gastrointestinal: Negative for nausea, vomiting, abdominal pain, diarrhea, constipation and abdominal distention.  Genitourinary: Positive for  vaginal discharge and menstrual problem. Negative for dysuria, frequency, flank pain, vaginal bleeding and difficulty urinating.  Musculoskeletal: Positive for back pain. Negative for myalgias and gait problem.       Rash, irritation bilateral thighs.  Skin:       Irritation of thighs.  Neurological: Negative for dizziness, speech difficulty, weakness, light-headedness, numbness and headaches.  Psychiatric/Behavioral: Negative for confusion and agitation. The patient is not nervous/anxious.     Allergies  Review of patient's allergies indicates no known allergies.  Home Medications   Current Outpatient Rx  Name Route Sig Dispense Refill  . METRONIDAZOLE 500 MG PO TABS Oral Take 1 tablet (500 mg total) by mouth 2 (two) times daily. 14 tablet 0  . NYSTATIN-TRIAMCINOLONE 100000-0.1 UNIT/GM-% EX CREA  Apply to affected area daily 15 g 0    BP 127/84  Pulse 92  Temp 98.3 F (36.8 C) (Oral)  Resp 20  Ht 5\' 3"  (1.6 m)  Wt 268 lb 6.4 oz (121.745 kg)  BMI 47.54 kg/m2  Physical Exam  Nursing note and vitals reviewed. Constitutional: She is oriented to person, place, and time. She appears well-developed and well-nourished.  HENT:  Head: Normocephalic.  Eyes: EOM are normal.  Neck: Neck supple.  Cardiovascular: Normal rate.   Pulmonary/Chest: Effort normal.  Abdominal: Soft. There is  no tenderness.  Genitourinary:       External genitalia without lesions. Thick white discharge vaginal vault. Cervix without lesions. No CMT, no adnexal tenderness. Uterus without palpable enlargement.  Musculoskeletal: Normal range of motion.       Mild erythema inner aspect, bilateral thighs. C/W monilia.  Neurological: She is alert and oriented to person, place, and time. No cranial nerve deficit.  Skin: Skin is warm and dry.  Psychiatric: She has a normal mood and affect. Her behavior is normal. Judgment and thought content normal.   Results for orders placed during the hospital encounter of  05/03/12 (from the past 24 hour(s))  URINALYSIS, ROUTINE W REFLEX MICROSCOPIC     Status: Normal   Collection Time   05/03/12 12:20 PM      Component Value Range   Color, Urine YELLOW  YELLOW   APPearance CLEAR  CLEAR   Specific Gravity, Urine 1.020  1.005 - 1.030   pH 6.5  5.0 - 8.0   Glucose, UA NEGATIVE  NEGATIVE mg/dL   Hgb urine dipstick NEGATIVE  NEGATIVE   Bilirubin Urine NEGATIVE  NEGATIVE   Ketones, ur NEGATIVE  NEGATIVE mg/dL   Protein, ur NEGATIVE  NEGATIVE mg/dL   Urobilinogen, UA 0.2  0.0 - 1.0 mg/dL   Nitrite NEGATIVE  NEGATIVE   Leukocytes, UA NEGATIVE  NEGATIVE  POCT PREGNANCY, URINE     Status: Normal   Collection Time   05/03/12 12:33 PM      Component Value Range   Preg Test, Ur NEGATIVE  NEGATIVE  WET PREP, GENITAL     Status: Abnormal   Collection Time   05/03/12  1:21 PM      Component Value Range   Yeast Wet Prep HPF POC NONE SEEN  NONE SEEN   Trich, Wet Prep NONE SEEN  NONE SEEN   Clue Cells Wet Prep HPF POC MODERATE (*) NONE SEEN   WBC, Wet Prep HPF POC FEW (*) NONE SEEN  GLUCOSE, CAPILLARY     Status: Abnormal   Collection Time   05/03/12  1:48 PM      Component Value Range   Glucose-Capillary 165 (*) 70 - 99 mg/dL   Assessment: Monilia   Bacterial vaginosis   Amenorrhea   Elevated blood glucose   Plan:  Diflucan 150 mg po now   Monistat II cream Rx   Flagyl Rx   Cultures for GC, Chlamydia pending   Follow up as planned for elevated blood glucose ED Course  Procedures   MDM

## 2012-05-05 LAB — GC/CHLAMYDIA PROBE AMP, GENITAL
Chlamydia, DNA Probe: NEGATIVE
GC Probe Amp, Genital: NEGATIVE

## 2012-06-10 ENCOUNTER — Ambulatory Visit (INDEPENDENT_AMBULATORY_CARE_PROVIDER_SITE_OTHER): Payer: 59 | Admitting: Family Medicine

## 2012-06-10 ENCOUNTER — Telehealth: Payer: Self-pay | Admitting: *Deleted

## 2012-06-10 VITALS — BP 112/68 | HR 72 | Temp 98.3°F | Resp 16 | Ht 63.0 in | Wt 272.4 lb

## 2012-06-10 DIAGNOSIS — Z0000 Encounter for general adult medical examination without abnormal findings: Secondary | ICD-10-CM

## 2012-06-10 DIAGNOSIS — N894 Leukoplakia of vagina: Secondary | ICD-10-CM

## 2012-06-10 DIAGNOSIS — B9689 Other specified bacterial agents as the cause of diseases classified elsewhere: Secondary | ICD-10-CM

## 2012-06-10 DIAGNOSIS — M25539 Pain in unspecified wrist: Secondary | ICD-10-CM

## 2012-06-10 DIAGNOSIS — Z124 Encounter for screening for malignant neoplasm of cervix: Secondary | ICD-10-CM

## 2012-06-10 DIAGNOSIS — N898 Other specified noninflammatory disorders of vagina: Secondary | ICD-10-CM

## 2012-06-10 DIAGNOSIS — R109 Unspecified abdominal pain: Secondary | ICD-10-CM

## 2012-06-10 DIAGNOSIS — E119 Type 2 diabetes mellitus without complications: Secondary | ICD-10-CM

## 2012-06-10 DIAGNOSIS — E669 Obesity, unspecified: Secondary | ICD-10-CM

## 2012-06-10 DIAGNOSIS — N912 Amenorrhea, unspecified: Secondary | ICD-10-CM

## 2012-06-10 LAB — POCT CBC
Lymph, poc: 3.6 — AB (ref 0.6–3.4)
MCHC: 30.3 g/dL — AB (ref 31.8–35.4)
MID (cbc): 0.6 (ref 0–0.9)
MPV: 9.6 fL (ref 0–99.8)
POC Granulocyte: 5.8 (ref 2–6.9)
POC LYMPH PERCENT: 35.7 %L (ref 10–50)
POC MID %: 5.8 %M (ref 0–12)
Platelet Count, POC: 384 10*3/uL (ref 142–424)
RDW, POC: 14.9 %

## 2012-06-10 LAB — POCT URINALYSIS DIPSTICK
Bilirubin, UA: NEGATIVE
Blood, UA: NEGATIVE
Nitrite, UA: NEGATIVE
Spec Grav, UA: 1.02
Urobilinogen, UA: 1
pH, UA: 6.5

## 2012-06-10 LAB — POCT URINE PREGNANCY: Preg Test, Ur: NEGATIVE

## 2012-06-10 LAB — COMPREHENSIVE METABOLIC PANEL
ALT: 11 U/L (ref 0–35)
AST: 10 U/L (ref 0–37)
Creat: 0.62 mg/dL (ref 0.50–1.10)
Total Bilirubin: 0.4 mg/dL (ref 0.3–1.2)

## 2012-06-10 LAB — POCT UA - MICROSCOPIC ONLY
Bacteria, U Microscopic: NEGATIVE
Casts, Ur, LPF, POC: NEGATIVE
Crystals, Ur, HPF, POC: NEGATIVE

## 2012-06-10 LAB — POCT WET PREP WITH KOH
KOH Prep POC: NEGATIVE
Trichomonas, UA: NEGATIVE
WBC Wet Prep HPF POC: NEGATIVE

## 2012-06-10 LAB — TSH: TSH: 1.68 u[IU]/mL (ref 0.350–4.500)

## 2012-06-10 LAB — LIPID PANEL
HDL: 48 mg/dL (ref >39)
LDL Cholesterol: 89 mg/dL (ref 0–99)
Total CHOL/HDL Ratio: 3.2 Ratio
Triglycerides: 75 mg/dL (ref <150)

## 2012-06-10 MED ORDER — METFORMIN HCL 500 MG PO TABS: 500.0000 mg | ORAL_TABLET | Freq: Two times a day (BID) | ORAL | Status: DC

## 2012-06-10 NOTE — Progress Notes (Addendum)
[facility] [address], [place] [address] [phone]  Date:  06/10/2012   Name:  Carmen Watts   DOB:  09-Sep-1981   MRN:  [id number]  PCP:  Kristie Cowman, MD    Chief Complaint: Establish Care, CPE, Foot Swelling and Tingling   History of Present Illness:  Carmen Watts is a 31 y.o. 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   She had some labs last year- she was told that she had pre-diabetes.  She does check her sugar at home sometimes- fasting glucose can be around 120.  She also has noted LE swelling for about a year.  It can wax and wane.  It tends to get worse as the day goes on, but does not occur every day.    She also has noted numbness in her right arm for several weeks. It comes and goes.  It can occur in her hand as well.  Does not seem to follow a pattern.  Not worse in the am, not worse with activity such as writing or typing.   She also has noted right flank/ side pain.  This has been present for about 2 weeks.  She thought that maybe she slept wrong, but it has not gone away. The pain fluctuates but does not seem to follow a pattern and is not worse after meals.   No urinary symptoms.  She had a recent case of yeast vaginitis.    She has a history of irregular menses.  LMP June 16th.  She does not use anything for contraception but is open to another child.  3 children at home- nearly 23, 63 and 84 years old.   She has not used any contraception in a long time and has not conceived.   No history of abnormal pap, last pap about 2 years ago. Her immunizations are UTD.   Patient Active Problem List  Diagnosis  . Glucose intolerance (impaired glucose tolerance)  . Neuropathic pain    Past Medical History  Diagnosis Date  . Asthma   . Ovarian cyst   . Gonorrhea     Past Surgical History  Procedure Date  . Cesarean section     x3     History  Substance Use Topics  . Smoking status: Former Smoker -- 0.5 packs/day for 2 years    Types: Cigarettes    Quit date: 04/28/2012  . Smokeless tobacco: Not on file  . Alcohol Use: Yes     socially    Family History  Problem Relation Age of Onset  . Thyroid disease Mother   . Diabetes Father   . Hypertension Father   . Cancer Maternal Grandmother   . Cancer Maternal Grandfather     No Known Allergies  Medication list has been reviewed and updated.  Current Outpatient Prescriptions on File Prior to Visit  Medication Sig Dispense Refill  . albuterol (PROVENTIL HFA;VENTOLIN HFA) 108 (90 BASE) MCG/ACT inhaler Inhale 2 puffs into the lungs every 6 (six) hours as needed. For asthma syptoms       . ibuprofen (ADVIL,MOTRIN) 200 MG tablet Take 200 mg by mouth every 6 (six) hours as needed. For headache pain       . nystatin-triamcinolone (MYCOLOG II) cream Apply to affected area daily  15 g  0    Review  of Systems:  As per HPI- otherwise negative.   Physical Examination: Filed Vitals:   06/10/12 0931  BP: 112/68  Pulse: 72  Temp: 98.3 F (36.8 C)  Resp: 16   Filed Vitals:   06/10/12 0931  Height: 5\' 3"  (1.6 m)  Weight: 272 lb 6.4 oz (123.56 kg)   Body mass index is 48.25 kg/(m^2). Ideal Body Weight: Weight in (lb) to have BMI = 25: 140.8   GEN: WDWN, NAD, Non-toxic, A & O x 3, morbid obesity HEENT: Atraumatic, Normocephalic. Neck supple. No masses, No LAD.  PEERl, EOMI, oropharynx and TM wnl Ears and Nose: No external deformity. CV: RRR, No M/G/R. No JVD. No thrill. No extra heart sounds. PULM: CTA B, no wheezes, crackles, rhonchi. No retractions. No resp. distress. No accessory muscle use. ABD: S, NT, +BS. No rebound. No HSM.  She has some poorly localized tenderness in her right side- it is not definitely associated with her gallbladder- seems more in the soft tissues of her side.   EXTR: No c/c/e.  No abnormality of right hand or arm on exam except for  slightly decreased grip strength compared with left.  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  Normal sensation and pulses.  NEURO Normal gait.  PSYCH: Normally interactive. Conversant. Not depressed or anxious appearing.  Calm demeanor.  Breast: normal, no masses or discharge GU: normal external and internal exam, no CMT or adnexal tenderness or masses, normal vaginal canal.   Results for orders placed in visit on 06/10/12  POCT CBC      Component Value Range   WBC 10.0  4.6 - 10.2 K/uL   Lymph, poc 3.6 (*) 0.6 - 3.4   POC LYMPH PERCENT 35.7  10 - 50 %L   MID (cbc) 0.6  0 - 0.9   POC MID % 5.8  0 - 12 %M   POC Granulocyte 5.8  2 - 6.9   Granulocyte percent 58.5  37 - 80 %G   RBC 4.74  4.04 - 5.48 M/uL   Hemoglobin 12.7  12.2 - 16.2 g/dL   HCT, POC 16.1  09.6 - 47.9 %   MCV 88.3  80 - 97 fL   MCH, POC 26.7 (*) 27 - 31.2 pg   MCHC 30.3 (*) 31.8 - 35.4 g/dL   RDW, POC 04.5     Platelet Count, POC 384  142 - 424 K/uL   MPV 9.6  0 - 99.8 fL  POCT URINE PREGNANCY      Component Value Range   Preg Test, Ur Negative    POCT GLYCOSYLATED HEMOGLOBIN (HGB A1C)      Component Value Range   Hemoglobin A1C 6.7    POCT URINALYSIS DIPSTICK      Component Value Range   Color, UA yellow     Clarity, UA clear     Glucose, UA negative     Bilirubin, UA negative     Ketones, UA negative     Spec Grav, UA 1.020     Blood, UA negative     pH, UA 6.5     Protein, UA negative     Urobilinogen, UA 1.0     Nitrite, UA negative     Leukocytes, UA Negative    POCT UA - MICROSCOPIC ONLY      Component Value Range   WBC, Ur, HPF, POC 0-2     RBC, urine, microscopic negative     Bacteria, U Microscopic negative     Mucus, UA  trace     Epithelial cells, urine per micros 1-6     Crystals, Ur, HPF, POC negative     Casts, Ur, LPF, POC negative     Yeast, UA negative     Amorphous positive     Wet prep: negative except for trace bacteria.  Results not coming up in Epic on my screen for some reason.     Assessment and  Plan: 1. Physical exam, annual  POCT CBC, Comprehensive metabolic panel, POCT urinalysis dipstick, POCT UA - Microscopic Only  2. Obesity  POCT glycosylated hemoglobin (Hb A1C), Lipid panel, TSH  3. Cervical cancer screening  Pap IG, CT/NG w/ reflex HPV when ASC-U  4. Amenorrhea  POCT urine pregnancy  5. Vaginal Discharge  POCT Wet Prep with KOH  6. Diabetes mellitus  metFORMIN (GLUCOPHAGE) 500 MG tablet  7. Abdominal  pain, other specified site  US Abdomen Complete  ' Went over several issues today.  Unfortunately Zamaria does indeed have DM.  We will start her on metformin today.  She plans to see a nutritionist- this service is available through [facility] and she is going to look into it.  Gave rx for a glucose meter.  I would like her to check her glucose at most a few times a week for the time being.  We will not start an ace as she is not on reliable contraception.  Plan to recheck her A1c in about 2 months.   Explained that her amenorrhea is likely due to anovulation due to obesity.  However, metformin can trigger ovulation and a return to fertility.  She suffered increased HA with combined OCP in the past and does not want to try these again.  Gave handout regarding different contraceptive options-?IUD might be a good option for her.  She will use condoms consistently for now, and consult with her OBG if she would like to use an IUD or implanon.  Explained that her diabetes could complicate a pregnancy, and she should be mindful of this if considering another pregnancy.   Abdominal pain: she could have gallbladder disease.  No sign of acute cholecystitis, but she does need an ultrasound.  Will do complete abdominal ultrasound as soon as possible- this will also evaluate her kidneys.  She will let me know if her pain changes in the meantime.  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  Gave wrist brace which she will use (especially at night). Let me know if not better- Sooner if  worse.   Will plan further follow- up pending labs.   Rhandi Despain, MD  06/16/12:Called and discussed her pap- negative, but did not see transformation zone.  Can repeat in one year.  Also did show BV- sent in Rx for flagyl to her drug store.  She is doing ok with he metformin, but she does have some diarrhea with this.  Reassured that this should get better- let me know if it does not.

## 2012-06-10 NOTE — Progress Notes (Signed)
31 year old Black female is here today to establish care, have a complete physical, concerned about swelling in feet and ankles, tingling in her right arm. Pt is fasting.  Pt states she is a [employer] and is worried about becoming a diabetic. Pt states that swelling in her feet and ankles has been going on and off for the last two to three weeks. Pt states that she has seen a physician in 2012 but did not follow up due to not having any insurance. Pt states that she is now wanting to become healthier and concerned about the swelling and tingling. Pt states she also has had right side pain for three weeks. Pt states that her menstral cycle is very irregular and would like a pregnancy test . Pt states she has no other complaints.

## 2012-06-10 NOTE — Patient Instructions (Addendum)
We have diagnosed you with Diabetes type 2 today.  Please talk to the nutritionist at work to plan a healthy diet.  You will start taking metformin twice a day (only once a day for the first week).  This should help to control your blood sugar.  However- it may also improve your fertility.  Use condoms! If you would like to use an IUD please contact your OB.    Support hose for your legs I will arrange an abdominal ultrasound to evaluate your gallbladder.

## 2012-06-10 NOTE — Telephone Encounter (Signed)
Left message asking patient to please return our call to see if her schedule is open next week to have her abdominal ultrasound completed.

## 2012-06-12 ENCOUNTER — Other Ambulatory Visit (HOSPITAL_COMMUNITY): Payer: Medicaid Other

## 2012-06-12 LAB — PAP IG, CT-NG, RFX HPV ASCU

## 2012-06-16 MED ORDER — METRONIDAZOLE 500 MG PO TABS: 500.0000 mg | ORAL_TABLET | Freq: Two times a day (BID) | ORAL | Status: AC

## 2012-06-16 NOTE — Addendum Note (Signed)
Addended by: Abbe Amsterdam C on: 06/16/2012 05:35 PM   Modules accepted: Orders

## 2012-06-19 ENCOUNTER — Ambulatory Visit (HOSPITAL_COMMUNITY)
Admission: RE | Admit: 2012-06-19 | Discharge: 2012-06-19 | Disposition: A | Discharge status: Home / Self Care | Payer: 59 | Source: Ambulatory Visit | Attending: Family Medicine | Admitting: Family Medicine

## 2012-06-19 ENCOUNTER — Encounter: Payer: Self-pay | Admitting: Family Medicine

## 2012-06-19 DIAGNOSIS — K7689 Other specified diseases of liver: Secondary | ICD-10-CM | POA: Insufficient documentation

## 2012-06-19 DIAGNOSIS — R109 Unspecified abdominal pain: Secondary | ICD-10-CM | POA: Insufficient documentation

## 2012-06-21 NOTE — Telephone Encounter (Signed)
Pt returned our call to get results of ultrasound. Please call [phone]

## 2012-06-23 ENCOUNTER — Telehealth: Payer: Self-pay | Admitting: Radiology

## 2012-06-23 ENCOUNTER — Other Ambulatory Visit: Payer: Self-pay | Admitting: Family Medicine

## 2012-06-23 ENCOUNTER — Other Ambulatory Visit (INDEPENDENT_AMBULATORY_CARE_PROVIDER_SITE_OTHER): Payer: 59 | Admitting: Family Medicine

## 2012-06-23 DIAGNOSIS — R109 Unspecified abdominal pain: Secondary | ICD-10-CM

## 2012-06-23 DIAGNOSIS — Z139 Encounter for screening, unspecified: Secondary | ICD-10-CM

## 2012-06-23 LAB — POCT URINE PREGNANCY: Preg Test, Ur: NEGATIVE

## 2012-06-23 NOTE — Telephone Encounter (Signed)
I have called patient, I scheduled her for MRI of liver with and without contrast she is called to be advised of this scan it is scheduled for tomorrow at [facility] at 8 am with at 745 arrival. She is advised to be NPO for this scan. She is also advised we need to repeat her pregnancy test, she is coming in this afternoon to have this done, she will ask for me at the desk, so she does not have to wait for HCG only. I have called her insurance plan, [employer] and spoke to Lauren, there is no precertification or prior authorization required for this scan.

## 2012-06-23 NOTE — Progress Notes (Signed)
Called to let her know urine HCG negative as expected- good for MRI tomorrow

## 2012-06-23 NOTE — Progress Notes (Signed)
Patient presented for  Pregnancy testing pre MRI scan, results to be sent to Dr Patsy Lager and entered in epic prior to her MRI scan sch for tomorrow am

## 2012-06-24 ENCOUNTER — Ambulatory Visit (HOSPITAL_COMMUNITY)
Admission: RE | Admit: 2012-06-24 | Discharge: 2012-06-24 | Disposition: A | Discharge status: Home / Self Care | Payer: 59 | Source: Ambulatory Visit | Attending: Family Medicine | Admitting: Family Medicine

## 2012-06-24 DIAGNOSIS — R109 Unspecified abdominal pain: Secondary | ICD-10-CM | POA: Insufficient documentation

## 2012-06-24 DIAGNOSIS — K7689 Other specified diseases of liver: Secondary | ICD-10-CM | POA: Insufficient documentation

## 2012-06-24 MED ORDER — GADOXETATE DISODIUM 0.25 MMOL/ML IV SOLN
11.0000 mL | Freq: Once | INTRAVENOUS | Status: AC | PRN
  Administered 2012-06-24: 11 mL via INTRAVENOUS

## 2012-06-25 ENCOUNTER — Telehealth: Payer: Self-pay

## 2012-06-25 ENCOUNTER — Other Ambulatory Visit: Payer: Self-pay | Admitting: Family Medicine

## 2012-06-25 DIAGNOSIS — D134 Benign neoplasm of liver: Secondary | ICD-10-CM

## 2012-06-25 NOTE — Telephone Encounter (Signed)
The patient called for results of MRI done yesterday 06/24/12. Please call the patient at work at [phone] as soon as results available.

## 2012-06-25 NOTE — Telephone Encounter (Signed)
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

## 2012-07-07 ENCOUNTER — Ambulatory Visit (INDEPENDENT_AMBULATORY_CARE_PROVIDER_SITE_OTHER): Payer: 59 | Admitting: Family Medicine

## 2012-07-07 VITALS — BP 132/80 | HR 83 | Temp 98.8°F | Resp 18 | Ht 63.0 in | Wt 266.0 lb

## 2012-07-07 DIAGNOSIS — E119 Type 2 diabetes mellitus without complications: Secondary | ICD-10-CM

## 2012-07-07 DIAGNOSIS — N898 Other specified noninflammatory disorders of vagina: Secondary | ICD-10-CM

## 2012-07-07 LAB — POCT WET PREP WITH KOH
KOH Prep POC: POSITIVE
Trichomonas, UA: NEGATIVE
Yeast Wet Prep HPF POC: NEGATIVE

## 2012-07-07 MED ORDER — FLUCONAZOLE 150 MG PO TABS: 150.0000 mg | ORAL_TABLET | Freq: Once | ORAL | Status: AC

## 2012-07-07 NOTE — Progress Notes (Signed)
[facility] [address] [place] [address] [phone]  Date:  07/07/2012   Name:  Carmen Watts   DOB:  1981/04/20   MRN:  [id number]  PCP:  Kristie Cowman, MD    Chief Complaint: Follow-up   History of Present Illness:  Carmen Watts is a 31 y.o. very pleasant female patient who presents with the following:  She is here today to evaluate possible BV.  She was treated with flagyl a couple of weeks ago.  She noted onset of vaginal itching late last week. She also noted thick, white, creamy vaginal discharge.   She is taking metformin for her DM- she notes loose stools, but this is getting better.  She has been working on diet changes. She is eating more vegetables and lean protein.  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    Has not yet sought the help of a nutritionist, but does plan to do this soon.    Patient Active Problem List  Diagnosis  . Glucose intolerance (impaired glucose tolerance)  . Neuropathic pain    Past Medical History  Diagnosis Date  . Asthma   . Ovarian cyst   . Gonorrhea     Past Surgical History  Procedure Date  . Cesarean section     x3    History  Substance Use Topics  . Smoking status: Former Smoker -- 0.5 packs/day for 2 years    Types: Cigarettes    Quit date: 04/28/2012  . Smokeless tobacco: Not on file  . Alcohol Use: Yes     socially    Family History  Problem Relation Age of Onset  . Thyroid disease Mother   . Diabetes Father   . Hypertension Father   . Cancer Maternal Grandmother   . Cancer Maternal Grandfather     No Known Allergies  Medication list has been reviewed and updated.  Current Outpatient Prescriptions on File Prior to Visit  Medication Sig Dispense Refill  . metFORMIN (GLUCOPHAGE) 500 MG tablet Take 1 tablet (500 mg total) by mouth 2 (two) times daily with a meal.  180 tablet  3  . albuterol (PROVENTIL HFA;VENTOLIN HFA) 108 (90 BASE) MCG/ACT  inhaler Inhale 2 puffs into the lungs every 6 (six) hours as needed. For asthma syptoms       . ibuprofen (ADVIL,MOTRIN) 200 MG tablet Take 200 mg by mouth every 6 (six) hours as needed. For headache pain       . nystatin-triamcinolone (MYCOLOG II) cream Apply to affected area daily  15 g  0    Review of Systems:  As per HPI- otherwise negative.   Physical Examination: Filed Vitals:   07/07/12 0827  BP: 132/80  Pulse: 83  Temp: 98.8 F (37.1 C)  Resp: 18   Filed Vitals:   07/07/12 0827  Height: 5\' 3"  (1.6 m)  Weight: 266 lb (120.657 kg)   Body mass index is 47.12 kg/(m^2). Ideal Body Weight: Weight in (lb) to have BMI = 25: 140.8   GEN: WDWN, NAD, Non-toxic, A & O x 3, obese HEENT: Atraumatic, Normocephalic. Neck supple. No masses, No LAD. Ears and Nose: No external deformity. CV: RRR, No M/G/R. No JVD. No thrill. No extra heart sounds. PULM: CTA B, no wheezes, crackles, rhonchi. No retractions. No resp. distress. No accessory muscle use. ABD: S, NT, ND, +BS. No rebound. No HSM. EXTR: No c/c/e NEURO Normal  gait.  PSYCH: Normally interactive. Conversant. Not depressed or anxious appearing.  Calm demeanor.   Self- collect WP:  Results for orders placed in visit on 07/07/12  POCT WET PREP WITH KOH      Component Value Range   Trichomonas, UA Negative     Clue Cells Wet Prep HPF POC 0-1     Epithelial Wet Prep HPF POC 5-16     Yeast Wet Prep HPF POC negative     Bacteria Wet Prep HPF POC 1+     RBC Wet Prep HPF POC negative     WBC Wet Prep HPF POC 0-1     KOH Prep POC Positive    POCT URINE PREGNANCY      Component Value Range   Preg Test, Ur Negative        Assessment and Plan: 1. Vaginal Discharge  POCT Wet Prep with KOH, POCT urine pregnancy, fluconazole (DIFLUCAN) 150 MG tablet  2. Diabetes mellitus, type 2     Treat for yeast vaginitis as above. Let me know if not helpful.  She is working on her DM- plan to recheck here in abut 2 months to repeat her A1c.    Talk to the nutritionist, and try a pedometer to motivate you to exercise.     Abbe Amsterdam, MD

## 2012-07-22 ENCOUNTER — Ambulatory Visit (INDEPENDENT_AMBULATORY_CARE_PROVIDER_SITE_OTHER): Payer: Commercial Managed Care - PPO | Admitting: General Surgery

## 2012-07-28 ENCOUNTER — Ambulatory Visit (INDEPENDENT_AMBULATORY_CARE_PROVIDER_SITE_OTHER): Payer: Commercial Managed Care - PPO | Admitting: General Surgery

## 2012-07-28 ENCOUNTER — Encounter (INDEPENDENT_AMBULATORY_CARE_PROVIDER_SITE_OTHER): Payer: Self-pay | Admitting: General Surgery

## 2012-07-28 VITALS — BP 122/84 | HR 84 | Temp 97.7°F | Resp 12 | Ht 63.0 in | Wt 267.8 lb

## 2012-07-28 DIAGNOSIS — R1011 Right upper quadrant pain: Secondary | ICD-10-CM

## 2012-07-28 DIAGNOSIS — R160 Hepatomegaly, not elsewhere classified: Secondary | ICD-10-CM

## 2012-07-28 DIAGNOSIS — K769 Liver disease, unspecified: Secondary | ICD-10-CM

## 2012-07-28 NOTE — Assessment & Plan Note (Signed)
Pt with likely small adenoma.  This mass is extraordinarily unlikely to cause pt symptoms.    At this size, would not recommend surgery on its own for this mass  If we end up doing cholecystectomy, would biopsy or resect if possible at time of operation.

## 2012-07-28 NOTE — Assessment & Plan Note (Addendum)
Pt with symptoms that sound pretty classic for gallbladder disease.  Pt is tender in the RUQ and has post prandial pain and nausea.  Will get HIDA with CCK to evaluate ejection fraction since patient does not have gallstones.    I advised patient that if HIDA is normal, we may need to try PPI or get EGD. Will determine follow up after HIDA scan.

## 2012-07-28 NOTE — Progress Notes (Signed)
Chief complaint:  RUQ pain, hepatic adenoma   HISTORY: Patient is a 31 year old female has had around 6 months of right upper quadrant pain. She describes the pain as sharp when it occurs at its worst. It is always after eating. It is worse with fatty foods. She has started to have it even with normal foods. She also gets nauseated. She has chronic soreness in the right upper quadrant and that is around 1-2 on a level of 10 pain scale. Her severe pain when it occurs is on about 8/10 and these episodes last around 10-20 minutes. These go back to the baseline level of soreness on their own. She had an ultrasound to workup this pain and did not have any evidence of gallstones. She did have a mass in the left liver. An MRI was performed which showed a probable adenoma. She is not on any oral contraceptives.  She also describes significant flatulence. She denies jaundice, fevers, chills, vomiting.  Past Medical History  Diagnosis Date  . Asthma   . Ovarian cyst   . Gonorrhea   . Diabetes mellitus     Past Surgical History  Procedure Date  . Cesarean section     x3    Current Outpatient Prescriptions  Medication Sig Dispense Refill  . albuterol (PROVENTIL HFA;VENTOLIN HFA) 108 (90 BASE) MCG/ACT inhaler Inhale 2 puffs into the lungs every 6 (six) hours as needed. For asthma syptoms       . ibuprofen (ADVIL,MOTRIN) 200 MG tablet Take 200 mg by mouth every 6 (six) hours as needed. For headache pain       . metFORMIN (GLUCOPHAGE) 500 MG tablet Take 1 tablet (500 mg total) by mouth 2 (two) times daily with a meal.  180 tablet  3     No Known Allergies   Family History  Problem Relation Age of Onset  . Thyroid disease Mother   . Diabetes Father   . Hypertension Father   . Ovarian cancer Maternal Grandmother   . Cancer Maternal Grandfather   . Lung cancer Maternal Grandfather   . Breast cancer Maternal Grandmother      History   Social History  . Marital Status: Single    Spouse  Name: N/A    Number of Children: N/A  . Years of Education: N/A   Social History Main Topics  . Smoking status: Former Smoker -- 0.5 packs/day for 2 years    Types: Cigarettes    Quit date: 04/28/2012  . Smokeless tobacco: None  . Alcohol Use: Yes     socially  . Drug Use: No  . Sexually Active: Yes    Birth Control/ Protection: None     REVIEW OF SYSTEMS - PERTINENT POSITIVES ONLY: 12 point review of systems negative other than HPI and PMH except for nasal congestion and cough  EXAM: Filed Vitals:   07/28/12 1609  BP: 122/84  Pulse: 84  Temp: 97.7 F (36.5 C)  Resp: 12    Gen:  No acute distress.  Well nourished and well groomed.   Neurological: Alert and oriented to person, place, and time. Coordination normal.  Head: Normocephalic and atraumatic.  Eyes: Conjunctivae are normal. Pupils are equal, round, and reactive to light. No scleral icterus.  Neck: Normal range of motion. Neck supple. No tracheal deviation or thyromegaly present.  Cardiovascular: Normal rate, regular rhythm, normal heart sounds and intact distal pulses.  Exam reveals no gallop and no friction rub.  No murmur heard. Respiratory: Effort normal.  No respiratory distress. No chest wall tenderness. Breath sounds normal.  No wheezes, rales or rhonchi.  GI: Soft. Bowel sounds are normal. The abdomen is soft.  There is tenderness in the RUQ.  There is no rebound and no guarding.  Musculoskeletal: Normal range of motion. Extremities are nontender.  Lymphadenopathy: No cervical, preauricular, postauricular or axillary adenopathy is present Skin: Skin is warm and dry. No rash noted. No diaphoresis. No erythema. No pallor. No clubbing, cyanosis, or edema.   Psychiatric: Normal mood and affect. Behavior is normal. Judgment and thought content normal.    LABORATORY RESULTS: Available labs are reviewed  Normal cmet except for Gluc 140, essentially normal CBC  RADIOLOGY RESULTS: See E-Chart or I-Site for most  recent results.  Images and reports are reviewed. MRI: IMPRESSION:  3.1 cm lesion in the lateral segment left hepatic lobe,  corresponding to the sonographic abnormality, likely reflecting a  benign hepatic adenoma.  Underlying moderate hepatic steatosis.  Ultrasound IMPRESSION:  1. 3.5 cm hypoechoic left liver lobe lesion. Presuming no history  of primary malignancy, most likely an area of focal nodular  hyperplasia or adenoma. Consider further evaluation with dedicated  pre and post contrast abdominal MRI with Eovist. If this is not  performed, ultrasound follow-up at 3 months is recommended to  confirm size stability.  2. No other acute process or explanation for pain.    ASSESSMENT AND PLAN: RUQ pain Pt with symptoms that sound pretty classic for gallbladder disease.  Pt is tender in the RUQ and has post prandial pain and nausea.  Will get HIDA with CCK to evaluate ejection fraction since patient does not have gallstones.    I advised patient that if HIDA is normal, we may need to try PPI or get EGD. Will determine follow up after HIDA scan.    Liver mass, left lobe, probable adenoma Pt with likely small adenoma.  This mass is extraordinarily unlikely to cause pt symptoms.    At this size, would not recommend surgery on its own for this mass  If we end up doing cholecystectomy, would biopsy or resect if possible at time of operation.       Maudry Diego MD Surgical Oncology, General and Endocrine Surgery [facility]      Visit Diagnoses: 1. RUQ pain   2. Liver mass, left lobe, probable adenoma     Primary Care Physician: Abbe Amsterdam, MD

## 2012-07-28 NOTE — Patient Instructions (Signed)
Get HIDA scan.    Follow up with depend on outcome.

## 2012-08-04 ENCOUNTER — Encounter (HOSPITAL_COMMUNITY)
Admission: RE | Admit: 2012-08-04 | Discharge: 2012-08-04 | Disposition: A | Discharge status: Home / Self Care | Payer: 59 | Source: Ambulatory Visit | Attending: General Surgery | Admitting: General Surgery

## 2012-08-04 DIAGNOSIS — R1011 Right upper quadrant pain: Secondary | ICD-10-CM | POA: Insufficient documentation

## 2012-08-04 MED ORDER — SINCALIDE 5 MCG IJ SOLR
0.0200 ug/kg | Freq: Once | INTRAMUSCULAR | Status: AC
  Administered 2012-08-04: 2.4 ug via INTRAVENOUS

## 2012-08-04 MED ORDER — TECHNETIUM TC 99M MEBROFENIN IV KIT
5.0000 | PACK | Freq: Once | INTRAVENOUS | Status: AC | PRN
  Administered 2012-08-04: 5 via INTRAVENOUS

## 2012-11-10 ENCOUNTER — Ambulatory Visit (INDEPENDENT_AMBULATORY_CARE_PROVIDER_SITE_OTHER): Payer: 59 | Admitting: Family Medicine

## 2012-11-10 VITALS — BP 117/77 | HR 76 | Temp 98.5°F | Resp 17 | Ht 64.5 in | Wt 265.0 lb

## 2012-11-10 DIAGNOSIS — E669 Obesity, unspecified: Secondary | ICD-10-CM

## 2012-11-10 DIAGNOSIS — E119 Type 2 diabetes mellitus without complications: Secondary | ICD-10-CM

## 2012-11-10 MED ORDER — METFORMIN HCL 500 MG PO TABS: 500.0000 mg | ORAL_TABLET | Freq: Two times a day (BID) | ORAL | Status: DC

## 2012-11-10 NOTE — Progress Notes (Signed)
[facility] [address] [place] [address] [phone]  Date:  11/10/2012   Name:  Carmen Watts   DOB:  03-12-1981   MRN:  [id number]  PCP:  Abbe Amsterdam, MD    Chief Complaint: Medication Refill   History of Present Illness:  Carmen Watts is a 31 y.o. very pleasant female patient who presents with the following:  She is here today to recheck her A1c.  She is working as a Engineer, civil (consulting), and does work some nights.  She is trying to eat well.  She has had her flu shot already.  She had a good cholesterol panel in July, and her A1c was 6.7 in July as well.    She has 3 children ages 9, 7 an 5.  She is aware that her lifestyel/ work schedule is not amenable to the kind of diet and exercise changes she would like to make.  She is working on possibly changing to a more standard work schedule   Patient Active Problem List  Diagnosis  . Glucose intolerance (impaired glucose tolerance)  . Neuropathic pain  . RUQ pain  . Liver mass, left lobe, probable adenoma    Past Medical History  Diagnosis Date  . Asthma   . Ovarian cyst   . Gonorrhea   . Diabetes mellitus     Past Surgical History  Procedure Date  . Cesarean section     x3    History  Substance Use Topics  . Smoking status: Former Smoker -- 0.5 packs/day for 2 years    Types: Cigarettes    Quit date: 04/28/2012  . Smokeless tobacco: Not on file  . Alcohol Use: Yes     Comment: socially    Family History  Problem Relation Age of Onset  . Thyroid disease Mother   . Diabetes Father   . Hypertension Father   . Ovarian cancer Maternal Grandmother   . Breast cancer Maternal Grandmother   . Cancer Maternal Grandmother   . Cancer Maternal Grandfather   . Lung cancer Maternal Grandfather   . Asthma Son     No Known Allergies  Medication list has been reviewed and updated.  Current Outpatient Prescriptions on File Prior to Visit  Medication Sig Dispense Refill  . albuterol (PROVENTIL  HFA;VENTOLIN HFA) 108 (90 BASE) MCG/ACT inhaler Inhale 2 puffs into the lungs every 6 (six) hours as needed. For asthma syptoms       . ibuprofen (ADVIL,MOTRIN) 200 MG tablet Take 200 mg by mouth every 6 (six) hours as needed. For headache pain       . metFORMIN (GLUCOPHAGE) 500 MG tablet Take 1 tablet (500 mg total) by mouth 2 (two) times daily with a meal.  180 tablet  3    Review of Systems:  As per HPI- otherwise negative.  Physical Examination: Filed Vitals:   11/10/12 0944  BP: 117/77  Pulse: 76  Temp: 98.5 F (36.9 C)  Resp: 17   Filed Vitals:   11/10/12 0944  Height: 5' 4.5" (1.638 m)  Weight: 265 lb (120.203 kg)   Body mass index is 44.78 kg/(m^2). Ideal Body Weight: Weight in (lb) to have BMI = 25: 147.6   GEN: WDWN, NAD, Non-toxic, A & O x 3, obese HEENT: Atraumatic, Normocephalic. Neck supple. No masses, No LAD. Ears and Nose: No external deformity. CV: RRR, No M/G/R. No JVD. No thrill. No extra heart sounds. PULM: CTA B, no wheezes, crackles, rhonchi. No  retractions. No resp. distress. No accessory muscle use. ABD: S, NT, ND EXTR: No c/c/e NEURO Normal gait.  PSYCH: Normally interactive. Conversant. Not depressed or anxious appearing.  Calm demeanor.   Results for orders placed in visit on 11/10/12  POCT GLYCOSYLATED HEMOGLOBIN (HGB A1C)      Component Value Range   Hemoglobin A1C 6.7       Assessment and Plan: 1. Diabetes mellitus, type 2  POCT glycosylated hemoglobin (Hb A1C)  2. Obesity    3. Diabetes mellitus  metFORMIN (GLUCOPHAGE) 500 MG tablet   Stable DM.  Continued to encourage her to work on diet/ exercise/ weight loss.  Refilled glucophage today.  See patient instructions for more details.     Abbe Amsterdam, MD

## 2012-11-10 NOTE — Patient Instructions (Addendum)
Keep working on [employer] and exercise.  Let's recheck in about 4 months.

## 2012-11-18 ENCOUNTER — Emergency Department (HOSPITAL_COMMUNITY)
Admission: EM | Admit: 2012-11-18 | Discharge: 2012-11-18 | Disposition: A | Discharge status: Home / Self Care | Payer: Commercial Managed Care - PPO | Source: Home / Self Care | Attending: Family Medicine | Admitting: Family Medicine

## 2012-11-18 ENCOUNTER — Encounter (HOSPITAL_COMMUNITY): Payer: Self-pay | Admitting: *Deleted

## 2012-11-18 DIAGNOSIS — S335XXA Sprain of ligaments of lumbar spine, initial encounter: Secondary | ICD-10-CM

## 2012-11-18 DIAGNOSIS — S39012A Strain of muscle, fascia and tendon of lower back, initial encounter: Secondary | ICD-10-CM

## 2012-11-18 MED ORDER — CYCLOBENZAPRINE HCL 5 MG PO TABS: 5.0000 mg | ORAL_TABLET | Freq: Three times a day (TID) | ORAL | Status: DC | PRN

## 2012-11-18 MED ORDER — KETOROLAC TROMETHAMINE 10 MG PO TABS: 10.0000 mg | ORAL_TABLET | Freq: Four times a day (QID) | ORAL | Status: DC | PRN

## 2012-11-18 NOTE — ED Notes (Signed)
Pt reports lower back pain that started today while trying to pull someone up at work unassisted

## 2012-11-18 NOTE — ED Provider Notes (Signed)
History     CSN: [id number]  Arrival date & time 11/18/12  1308   First MD Initiated Contact with Patient 11/18/12 1938      Chief Complaint  Patient presents with  . Back Pain    (Consider location/radiation/quality/duration/timing/severity/associated sxs/prior treatment) Patient is a 31 y.o. female presenting with back pain. The history is provided by the patient.  Back Pain  This is a new problem. The current episode started 3 to 5 hours ago. The problem has not changed since onset.The pain is associated with lifting heavy objects (pulling pt up in bed and felt pain inlow back.). The pain is present in the lumbar spine. The quality of the pain is described as stabbing. The pain does not radiate. The pain is moderate. The symptoms are aggravated by bending, twisting and certain positions. Pertinent negatives include no chest pain, no fever, no numbness, no abdominal pain, no bladder incontinence, no pelvic pain, no leg pain, no paresthesias, no paresis, no tingling and no weakness.    Past Medical History  Diagnosis Date  . Asthma   . Ovarian cyst   . Gonorrhea   . Diabetes mellitus     Past Surgical History  Procedure Date  . Cesarean section     x3    Family History  Problem Relation Age of Onset  . Thyroid disease Mother   . Diabetes Father   . Hypertension Father   . Ovarian cancer Maternal Grandmother   . Breast cancer Maternal Grandmother   . Cancer Maternal Grandmother   . Cancer Maternal Grandfather   . Lung cancer Maternal Grandfather   . Asthma Son     History  Substance Use Topics  . Smoking status: Former Smoker -- 0.5 packs/day for 2 years    Types: Cigarettes    Quit date: 04/28/2012  . Smokeless tobacco: Not on file  . Alcohol Use: Yes     Comment: socially    OB History    Grav Para Term Preterm Abortions TAB SAB Ect Mult Living   4 3 3  1 1    3       Review of Systems  Constitutional: Negative for fever.  Cardiovascular: Negative  for chest pain.  Gastrointestinal: Negative.  Negative for abdominal pain.  Genitourinary: Negative for bladder incontinence and pelvic pain.  Musculoskeletal: Positive for back pain. Negative for joint swelling and gait problem.  Neurological: Negative for tingling, weakness, numbness and paresthesias.    Allergies  Review of patient's allergies indicates no known allergies.  Home Medications   Current Outpatient Rx  Name  Route  Sig  Dispense  Refill  . METFORMIN HCL 500 MG PO TABS   Oral   Take 1 tablet (500 mg total) by mouth 2 (two) times daily with a meal.   180 tablet   3   . ALBUTEROL SULFATE HFA 108 (90 BASE) MCG/ACT IN AERS   Inhalation   Inhale 2 puffs into the lungs every 6 (six) hours as needed. For asthma syptoms          . CYCLOBENZAPRINE HCL 5 MG PO TABS   Oral   Take 1 tablet (5 mg total) by mouth 3 (three) times daily as needed for muscle spasms.   30 tablet   0   . IBUPROFEN 200 MG PO TABS   Oral   Take 200 mg by mouth every 6 (six) hours as needed. For headache pain          .  KETOROLAC TROMETHAMINE 10 MG PO TABS   Oral   Take 1 tablet (10 mg total) by mouth every 6 (six) hours as needed for pain.   20 tablet   0     BP 117/79  Pulse 74  Temp 97 F (36.1 C) (Oral)  SpO2 97%  LMP 10/27/2012  Physical Exam  Nursing note and vitals reviewed. Constitutional: She is oriented to person, place, and time. She appears well-developed and well-nourished.  Abdominal: Soft. Bowel sounds are normal.  Musculoskeletal: She exhibits tenderness.       Lumbar back: She exhibits decreased range of motion, tenderness, pain and spasm.  Neurological: She is alert and oriented to person, place, and time.  Skin: Skin is warm and dry.    ED Course  Procedures (including critical care time)  Labs Reviewed - No data to display No results found.   1. Strain of lumbar paraspinal muscle       MDM          Linna Hoff, MD 11/18/12 [phone]

## 2013-05-31 ENCOUNTER — Encounter (HOSPITAL_COMMUNITY): Payer: Self-pay | Admitting: Family Medicine

## 2013-05-31 ENCOUNTER — Emergency Department (HOSPITAL_COMMUNITY)
Admission: EM | Admit: 2013-05-31 | Discharge: 2013-06-01 | Disposition: A | Discharge status: Home / Self Care | Payer: Self-pay | Attending: Emergency Medicine | Admitting: Emergency Medicine

## 2013-05-31 DIAGNOSIS — L03012 Cellulitis of left finger: Secondary | ICD-10-CM

## 2013-05-31 DIAGNOSIS — L03019 Cellulitis of unspecified finger: Secondary | ICD-10-CM | POA: Insufficient documentation

## 2013-05-31 DIAGNOSIS — R21 Rash and other nonspecific skin eruption: Secondary | ICD-10-CM | POA: Insufficient documentation

## 2013-05-31 DIAGNOSIS — N390 Urinary tract infection, site not specified: Secondary | ICD-10-CM | POA: Insufficient documentation

## 2013-05-31 DIAGNOSIS — R350 Frequency of micturition: Secondary | ICD-10-CM | POA: Insufficient documentation

## 2013-05-31 DIAGNOSIS — Y929 Unspecified place or not applicable: Secondary | ICD-10-CM | POA: Insufficient documentation

## 2013-05-31 DIAGNOSIS — R109 Unspecified abdominal pain: Secondary | ICD-10-CM | POA: Insufficient documentation

## 2013-05-31 DIAGNOSIS — R300 Dysuria: Secondary | ICD-10-CM | POA: Insufficient documentation

## 2013-05-31 DIAGNOSIS — R3915 Urgency of urination: Secondary | ICD-10-CM | POA: Insufficient documentation

## 2013-05-31 DIAGNOSIS — R319 Hematuria, unspecified: Secondary | ICD-10-CM | POA: Insufficient documentation

## 2013-05-31 DIAGNOSIS — Z79899 Other long term (current) drug therapy: Secondary | ICD-10-CM | POA: Insufficient documentation

## 2013-05-31 DIAGNOSIS — E119 Type 2 diabetes mellitus without complications: Secondary | ICD-10-CM | POA: Insufficient documentation

## 2013-05-31 DIAGNOSIS — Y939 Activity, unspecified: Secondary | ICD-10-CM | POA: Insufficient documentation

## 2013-05-31 DIAGNOSIS — J45909 Unspecified asthma, uncomplicated: Secondary | ICD-10-CM | POA: Insufficient documentation

## 2013-05-31 DIAGNOSIS — Z8742 Personal history of other diseases of the female genital tract: Secondary | ICD-10-CM | POA: Insufficient documentation

## 2013-05-31 DIAGNOSIS — Z8619 Personal history of other infectious and parasitic diseases: Secondary | ICD-10-CM | POA: Insufficient documentation

## 2013-05-31 DIAGNOSIS — Z87891 Personal history of nicotine dependence: Secondary | ICD-10-CM | POA: Insufficient documentation

## 2013-05-31 NOTE — ED Notes (Signed)
Patient states she has had dysuria and cloudy urine for the past 2 days. States urine has some odor. Also here to have left middle evaluated. States she closed her finger in a car door on Thursday and states her finger has been swelling since that time.

## 2013-06-01 LAB — URINALYSIS, ROUTINE W REFLEX MICROSCOPIC
Glucose, UA: 100 mg/dL — AB
Specific Gravity, Urine: 1.025 (ref 1.005–1.030)
pH: 6 (ref 5.0–8.0)

## 2013-06-01 LAB — URINE MICROSCOPIC-ADD ON

## 2013-06-01 MED ORDER — FLUCONAZOLE 150 MG PO TABS: 150.0000 mg | ORAL_TABLET | Freq: Once | ORAL | Status: DC

## 2013-06-01 MED ORDER — SULFAMETHOXAZOLE-TMP DS 800-160 MG PO TABS
1.0000 | ORAL_TABLET | Freq: Once | ORAL | Status: AC
  Administered 2013-06-01: 1 via ORAL
  Filled 2013-06-01: qty 1

## 2013-06-01 MED ORDER — SULFAMETHOXAZOLE-TRIMETHOPRIM 800-160 MG PO TABS: 1.0000 | ORAL_TABLET | Freq: Two times a day (BID) | ORAL | Status: DC

## 2013-06-01 MED ORDER — IBUPROFEN 800 MG PO TABS
800.0000 mg | ORAL_TABLET | Freq: Once | ORAL | Status: AC
  Administered 2013-06-01: 800 mg via ORAL
  Filled 2013-06-01: qty 1

## 2013-06-01 NOTE — ED Provider Notes (Signed)
Medical screening examination/treatment/procedure(s) were performed by non-physician practitioner and as supervising physician I was immediately available for consultation/collaboration.  Jones Skene, M.D.     Jones Skene, MD 06/01/13 0400

## 2013-06-01 NOTE — ED Provider Notes (Signed)
History    CSN: [id number] Arrival date & time 05/31/13  2210  First MD Initiated Contact with Patient 06/01/13 0000     Chief Complaint  Patient presents with  . Urinary Tract Infection  . Finger Injury   (Consider location/radiation/quality/duration/timing/severity/associated sxs/prior Treatment) The history is provided by the patient and medical records. No language interpreter was used.    Carmen Watts is a 32 y.o. female  with a hx of asthma, DM presents to the Emergency Department complaining of gradual, persistent, progressively worsening dysuria beginning yesterday with associated hematuria, frequency and urgency.  Pt also with mild abd cramping in the suprapubic area.    Pt also c/o urine being cloudy and malodorous.  Pt denies fever, chills, N/V/D.    Pt also c/o L middle finger pain and swelling after slamming hand her hand in the door 4 days ago.  Pt's hand was initially OK, but then they next day began to swell and throb. Pt with  Associated redness of the finger.  Nothing makes it better or worse and she had not tried any OTC treatments.     Past Medical History  Diagnosis Date  . Asthma   . Ovarian cyst   . Gonorrhea   . Diabetes mellitus    Past Surgical History  Procedure Laterality Date  . Cesarean section      x3   Family History  Problem Relation Age of Onset  . Thyroid disease Mother   . Diabetes Father   . Hypertension Father   . Ovarian cancer Maternal Grandmother   . Breast cancer Maternal Grandmother   . Cancer Maternal Grandmother   . Cancer Maternal Grandfather   . Lung cancer Maternal Grandfather   . Asthma Son    History  Substance Use Topics  . Smoking status: Former Smoker -- 0.50 packs/day for 2 years    Types: Cigarettes    Quit date: 04/28/2012  . Smokeless tobacco: Not on file  . Alcohol Use: Yes     Comment: Socially   OB History   Grav Para Term Preterm Abortions TAB SAB Ect Mult Living   4 3 3  1 1    3      Review of  Systems  Constitutional: Negative for fever, diaphoresis, appetite change, fatigue and unexpected weight change.  HENT: Negative for mouth sores and neck stiffness.   Eyes: Negative for visual disturbance.  Respiratory: Negative for cough, chest tightness, shortness of breath and wheezing.   Cardiovascular: Negative for chest pain.  Gastrointestinal: Positive for abdominal pain. Negative for nausea, vomiting, diarrhea and constipation.  Endocrine: Negative for polydipsia, polyphagia and polyuria.  Genitourinary: Positive for dysuria, urgency, frequency and hematuria.  Musculoskeletal: Positive for arthralgias. Negative for back pain.  Skin: Positive for color change and rash.  Allergic/Immunologic: Negative for immunocompromised state.  Neurological: Negative for syncope, light-headedness and headaches.  Hematological: Does not bruise/bleed easily.  Psychiatric/Behavioral: Negative for sleep disturbance. The patient is not nervous/anxious.     Allergies  Review of patient's allergies indicates no known allergies.  Home Medications   Current Outpatient Rx  Name  Route  Sig  Dispense  Refill  . albuterol (PROVENTIL HFA;VENTOLIN HFA) 108 (90 BASE) MCG/ACT inhaler   Inhalation   Inhale 2 puffs into the lungs every 6 (six) hours as needed. For asthma syptoms          . metFORMIN (GLUCOPHAGE) 500 MG tablet   Oral   Take 1 tablet (500 mg  total) by mouth 2 (two) times daily with a meal.   180 tablet   3   . sulfamethoxazole-trimethoprim (SEPTRA DS) 800-160 MG per tablet   Oral   Take 1 tablet by mouth every 12 (twelve) hours.   20 tablet   0    BP 125/67  Pulse 88  Temp(Src) 98.6 F (37 C) (Oral)  Resp 16  Ht 5\' 3"  (1.6 m)  Wt 240 lb (108.863 kg)  BMI 42.52 kg/m2  SpO2 98%  LMP 05/21/2013 Physical Exam  Nursing note and vitals reviewed. Constitutional: She is oriented to person, place, and time. She appears well-developed and well-nourished. No distress.  Awake, alert,  nontoxic appearance  HENT:  Head: Normocephalic and atraumatic.  Mouth/Throat: Oropharynx is clear and moist. No oropharyngeal exudate.  Eyes: Conjunctivae are normal. Pupils are equal, round, and reactive to light. No scleral icterus.  Neck: Normal range of motion. Neck supple.  Cardiovascular: Normal rate, regular rhythm, normal heart sounds and intact distal pulses.   No murmur heard. Pulmonary/Chest: Effort normal and breath sounds normal. No respiratory distress. She has no wheezes. She has no rales.  Abdominal: Soft. Bowel sounds are normal. She exhibits no distension and no mass. There is tenderness in the suprapubic area. There is no rigidity, no rebound, no guarding, no CVA tenderness, no tenderness at McBurney's point and negative Murphy's sign.  Musculoskeletal: Normal range of motion. She exhibits no edema.  Lymphadenopathy:    She has no cervical adenopathy.  Neurological: She is alert and oriented to person, place, and time.  Speech is clear and goal oriented Moves extremities without ataxia  Skin: Skin is warm and dry. She is not diaphoretic. There is erythema.  Psychiatric: She has a normal mood and affect.    ED Course  INCISION AND DRAINAGE Date/Time: 06/01/2013 12:40 AM Performed by: Dierdre Forth Authorized by: Dierdre Forth Consent: Verbal consent obtained. Consent given by: patient Patient understanding: patient states understanding of the procedure being performed Patient consent: the patient's understanding of the procedure matches consent given Procedure consent: procedure consent matches procedure scheduled Relevant documents: relevant documents present and verified Site marked: the operative site was marked Required items: required blood products, implants, devices, and special equipment available Patient identity confirmed: verbally with patient and arm band Type: abscess (Paronychia) Body area: upper extremity Location details: left long  finger Anesthesia: digital block Local anesthetic: lidocaine 1% without epinephrine Anesthetic total: 3 ml Patient sedated: no Scalpel size: 11 Incision type: single straight Complexity: complex Drainage: purulent Drainage amount: moderate Wound treatment: wound left open Patient tolerance: Patient tolerated the procedure well with no immediate complications.   (including critical care time) Labs Reviewed  URINALYSIS, ROUTINE W REFLEX MICROSCOPIC - Abnormal; Notable for the following:    APPearance CLOUDY (*)    Glucose, UA 100 (*)    Hgb urine dipstick LARGE (*)    Nitrite POSITIVE (*)    Leukocytes, UA LARGE (*)    All other components within normal limits  URINE MICROSCOPIC-ADD ON - Abnormal; Notable for the following:    Squamous Epithelial / LPF FEW (*)    Bacteria, UA MANY (*)    All other components within normal limits  URINE CULTURE   No results found. 1. Paronychia of finger of left hand   2. Urinary tract infection   3. Diabetes     MDM  LEVETTE PAULICK presents with symptoms of UTI and paronychia on exam.  Urinalysis with positive nitrites and leukocytes  as well as numerous to count white blood cells and many bacteria. Pt has been diagnosed with a UTI. Pt is afebrile, no CVA tenderness, normotensive, and denies N/V. Patient also with paronychia amenable to incision and drainage.  Encouraged wound recheck in 2 days with Dr. Merlyn Lot of hand surgery.  Encouraged home warm soaks and flushing.  Mild signs of cellulitis is surrounding skin and patient with history of diabetes.  Will d/c to home with antibiotics to cover both the urinary tract infection in the paronychia. I have also discussed reasons to return immediately to the ER.  Patient expresses understanding and agrees with plan.  Patient also states she gets a yeast infection when taking antibiotics. Will write for Diflucan   Dierdre Forth, PA-C 06/01/13 0119  Pablo Stauffer, PA-C 06/01/13 4540

## 2013-06-03 LAB — URINE CULTURE: Colony Count: >=100000

## 2013-06-04 NOTE — ED Notes (Signed)
+   Urine E Coli -treated with Fluconazole -Ok per [place]

## 2014-02-11 ENCOUNTER — Encounter (HOSPITAL_COMMUNITY): Payer: Self-pay | Admitting: Emergency Medicine

## 2014-02-11 ENCOUNTER — Emergency Department (HOSPITAL_COMMUNITY)
Admission: EM | Admit: 2014-02-11 | Discharge: 2014-02-12 | Disposition: A | Discharge status: Home / Self Care | Payer: Medicaid Other | Attending: Emergency Medicine | Admitting: Emergency Medicine

## 2014-02-11 ENCOUNTER — Emergency Department (HOSPITAL_COMMUNITY): Payer: Medicaid Other

## 2014-02-11 DIAGNOSIS — E119 Type 2 diabetes mellitus without complications: Secondary | ICD-10-CM | POA: Insufficient documentation

## 2014-02-11 DIAGNOSIS — R42 Dizziness and giddiness: Secondary | ICD-10-CM | POA: Insufficient documentation

## 2014-02-11 DIAGNOSIS — Z8742 Personal history of other diseases of the female genital tract: Secondary | ICD-10-CM | POA: Insufficient documentation

## 2014-02-11 DIAGNOSIS — R000 Tachycardia, unspecified: Secondary | ICD-10-CM | POA: Insufficient documentation

## 2014-02-11 DIAGNOSIS — Z87891 Personal history of nicotine dependence: Secondary | ICD-10-CM | POA: Insufficient documentation

## 2014-02-11 DIAGNOSIS — Z8619 Personal history of other infectious and parasitic diseases: Secondary | ICD-10-CM | POA: Insufficient documentation

## 2014-02-11 DIAGNOSIS — J45901 Unspecified asthma with (acute) exacerbation: Secondary | ICD-10-CM | POA: Insufficient documentation

## 2014-02-11 DIAGNOSIS — Z79899 Other long term (current) drug therapy: Secondary | ICD-10-CM | POA: Insufficient documentation

## 2014-02-11 DIAGNOSIS — R0602 Shortness of breath: Secondary | ICD-10-CM

## 2014-02-11 LAB — I-STAT TROPONIN, ED: Troponin i, poc: 0 ng/mL (ref 0.00–0.08)

## 2014-02-11 LAB — CBC
HEMATOCRIT: 39.7 % (ref 36.0–46.0)
HEMOGLOBIN: 13.8 g/dL (ref 12.0–15.0)
MCH: 28.9 pg (ref 26.0–34.0)
MCHC: 34.8 g/dL (ref 30.0–36.0)
MCV: 83.1 fL (ref 78.0–100.0)
Platelets: 345 10*3/uL (ref 150–400)
RBC: 4.78 MIL/uL (ref 3.87–5.11)
RDW: 13.6 % (ref 11.5–15.5)
WBC: 9 10*3/uL (ref 4.0–10.5)

## 2014-02-11 LAB — PRO B NATRIURETIC PEPTIDE: Pro B Natriuretic peptide (BNP): 5.8 pg/mL (ref 0–125)

## 2014-02-11 LAB — BASIC METABOLIC PANEL
BUN: 7 mg/dL (ref 6–23)
CHLORIDE: 99 meq/L (ref 96–112)
CO2: 22 meq/L (ref 19–32)
CREATININE: 0.58 mg/dL (ref 0.50–1.10)
Calcium: 8.3 mg/dL — ABNORMAL LOW (ref 8.4–10.5)
GFR calc Af Amer: >90 mL/min (ref >90)
GFR calc non Af Amer: >90 mL/min (ref >90)
GLUCOSE: 178 mg/dL — AB (ref 70–99)
Potassium: 3.6 mEq/L — ABNORMAL LOW (ref 3.7–5.3)
Sodium: 138 mEq/L (ref 137–147)

## 2014-02-11 MED ORDER — IOHEXOL 350 MG/ML SOLN
100.0000 mL | Freq: Once | INTRAVENOUS | Status: AC | PRN
Start: 2014-02-11 — End: 2014-02-11
  Administered 2014-02-11: 100 mL via INTRAVENOUS

## 2014-02-11 NOTE — ED Notes (Signed)
EKG handed to [place], MD

## 2014-02-11 NOTE — ED Provider Notes (Signed)
CSN: [id number]     Arrival date & time 02/11/14  2059 History   First MD Initiated Contact with Patient 02/11/14 2219     Chief Complaint  Patient presents with  . Chest Pain     (Consider location/radiation/quality/duration/timing/severity/associated sxs/prior Treatment) HPI Comments: Patient is a 33 year old female with history of diabetes, asthma, gonorrhea, ovarian cyst who presents today with shortness of breath and chest pain. She reports that this has been worsening since yesterday. The pain initially began as a chest pressure and tightness. She took ibuprofen which helped the pain. A few hours ago her shortness of breath significantly worsened. She feels as though she cannot catch her breath. She denies any history of DVT or PE no recent surgery, recent travel, prolonged immobilization. She denies any active cancer. Denies any recent illness, fever, chills, nausea, vomiting or abdominal pain, numbness, weakness, leg swelling. She does not use any form of birth control. She denies increased stress or feelings of anxiety.   Patient is a 33 y.o. female presenting with chest pain. The history is provided by the patient. No language interpreter was used.  Chest Pain Associated symptoms: shortness of breath   Associated symptoms: no abdominal pain, no cough, no dizziness, no fever, no nausea and not vomiting     Past Medical History  Diagnosis Date  . Asthma   . Ovarian cyst   . Gonorrhea   . Diabetes mellitus    Past Surgical History  Procedure Laterality Date  . Cesarean section      x3   Family History  Problem Relation Age of Onset  . Thyroid disease Mother   . Diabetes Father   . Hypertension Father   . Ovarian cancer Maternal Grandmother   . Breast cancer Maternal Grandmother   . Cancer Maternal Grandmother   . Cancer Maternal Grandfather   . Lung cancer Maternal Grandfather   . Asthma Son    History  Substance Use Topics  . Smoking status: Former Smoker -- 0.50  packs/day for 2 years    Types: Cigarettes    Quit date: 04/28/2012  . Smokeless tobacco: Not on file  . Alcohol Use: Yes     Comment: Socially   OB History   Grav Para Term Preterm Abortions TAB SAB Ect Mult Living   4 3 3  1 1    3      Review of Systems  Constitutional: Negative for fever and chills.  Respiratory: Positive for chest tightness and shortness of breath. Negative for cough.   Cardiovascular: Positive for chest pain.  Gastrointestinal: Negative for nausea, vomiting and abdominal pain.  Neurological: Positive for light-headedness. Negative for dizziness.  All other systems reviewed and are negative.      Allergies  Review of patient's allergies indicates no known allergies.  Home Medications   Current Outpatient Rx  Name  Route  Sig  Dispense  Refill  . albuterol (PROVENTIL HFA;VENTOLIN HFA) 108 (90 BASE) MCG/ACT inhaler   Inhalation   Inhale 2 puffs into the lungs every 6 (six) hours as needed. For asthma syptoms          . metFORMIN (GLUCOPHAGE) 500 MG tablet   Oral   Take 500 mg by mouth 2 (two) times daily with a meal.         . EXPIRED: metFORMIN (GLUCOPHAGE) 500 MG tablet   Oral   Take 1 tablet (500 mg total) by mouth 2 (two) times daily with a meal.   180 tablet  3    BP 105/63  Pulse 104  Temp(Src) 98.8 F (37.1 C) (Oral)  Resp 26  SpO2 100%  LMP 02/08/2014 Physical Exam  Nursing note and vitals reviewed. Constitutional: She is oriented to person, place, and time. She appears well-developed and well-nourished. No distress.  HENT:  Head: Normocephalic and atraumatic.  Right Ear: External ear normal.  Left Ear: External ear normal.  Nose: Nose normal.  Mouth/Throat: Oropharynx is clear and moist.  Eyes: Conjunctivae are normal.  Neck: Normal range of motion.  Cardiovascular: Regular rhythm and normal heart sounds.  Tachycardia present.   Pulmonary/Chest: Breath sounds normal. No stridor. Tachypnea noted. No respiratory distress.  She has no wheezes. She has no rales.  Abdominal: Soft. She exhibits no distension.  Musculoskeletal: Normal range of motion.  Neurological: She is alert and oriented to person, place, and time. She has normal strength.  Skin: Skin is warm and dry. She is not diaphoretic. No erythema.  Psychiatric: She has a normal mood and affect. Her behavior is normal.    ED Course  Procedures (including critical care time) Labs Review Labs Reviewed  BASIC METABOLIC PANEL - Abnormal; Notable for the following:    Potassium 3.6 (*)    Glucose, Bld 178 (*)    Calcium 8.3 (*)    All other components within normal limits  CBC  PRO B NATRIURETIC PEPTIDE  TROPONIN I  I-STAT TROPOININ, ED  POC URINE PREG, ED   Imaging Review Dg Chest 2 View  02/11/2014   CLINICAL DATA:  Left-sided chest pain, pressure  EXAM: CHEST  2 VIEW  COMPARISON:  Prior radiograph from 12/21/2003.  FINDINGS: The cardiac and mediastinal silhouettes are stable in size and contour, and remain within normal limits.  The lungs are normally inflated. No airspace consolidation, pleural effusion, or pulmonary edema is identified. There is no pneumothorax.  No acute osseous abnormality identified.  IMPRESSION: No active cardiopulmonary disease.   Electronically Signed   By: Jeannine Boga M.D.   On: 02/11/2014 22:25   Ct Angio Chest Pe W/cm &/or Wo Cm  02/12/2014   CLINICAL DATA:  Chest tightness with dizziness and shortness of breath. Rule out PE.  EXAM: CT ANGIOGRAPHY CHEST WITH CONTRAST  TECHNIQUE: Multidetector CT imaging of the chest was performed using the standard protocol during bolus administration of intravenous contrast. Multiplanar CT image reconstructions and MIPs were obtained to evaluate the vascular anatomy.  CONTRAST:  174mL OMNIPAQUE IOHEXOL 350 MG/ML SOLN  COMPARISON:  Chest radiographs 02/11/2014 and abdominal MRI 06/24/2012  FINDINGS: No pulmonary arterial filling defects are identified within segmental or more central  pulmonary arteries. Evaluation of subsegmental arteries cannot be adequately performed due to suboptimal opacification and artifact from respiratory motion and patient body habitus. The heart is normal in size. No enlarged axillary, mediastinal, or hilar lymph nodes are identified. There is no pericardial or pleural effusion.  Evaluation of the lung parenchyma is mildly limited due to motion artifact. No consolidation or lung mass is identified.  2.8 cm enhancing lesion in the lateral segment of the left hepatic lobe is grossly unchanged in size from prior MRI. No acute osseous abnormality is identified.  Review of the MIP images confirms the above findings.  IMPRESSION: 1. No evidence of segmental or more proximal pulmonary emboli. 2. Unchanged liver lesion, more fully evaluated on prior MRI.   Electronically Signed   By: Logan Bores   On: 02/12/2014 00:09     EKG Interpretation None  MDM   Final diagnoses:  Shortness of breath    Patient presents to ED for evaluation of chest tightness and shortness of breath with suddenly worsened a few hours prior to arrival. On my initial exam patient was tachycardiac and tachypneic. No wheezes to suggest asthma exacerbation. CT angio was done to rule out PE. Troponin negative x 2. EKG is nonischemic. BNP is not elevated. Patient feels significantly improved prior to discharge. She is no longer tachycardic or tachypneic. Strict return instructions given. She will follow up with her PCP. Vital signs stable for discharge. Patient / Family / Caregiver informed of clinical course, understand medical decision-making process, and agree with plan.   Elwyn Lade, PA-C 02/12/14 1546

## 2014-02-11 NOTE — ED Notes (Signed)
Pt. reports chest heaviness/tightness with dizziness and SOB onset yesterday , no nausea or diaphoresis .

## 2014-02-11 NOTE — ED Notes (Signed)
Pt has returned from radiology.  

## 2014-02-12 LAB — TROPONIN I

## 2014-02-12 NOTE — ED Notes (Signed)
Pt reports she feels like her breathing is better at this time.

## 2014-02-12 NOTE — Discharge Instructions (Signed)

## 2014-02-14 ENCOUNTER — Emergency Department (HOSPITAL_COMMUNITY)
Admission: EM | Admit: 2014-02-14 | Discharge: 2014-02-14 | Disposition: A | Discharge status: Home / Self Care | Payer: Medicaid Other | Attending: Emergency Medicine | Admitting: Emergency Medicine

## 2014-02-14 ENCOUNTER — Encounter (HOSPITAL_COMMUNITY): Payer: Self-pay | Admitting: Emergency Medicine

## 2014-02-14 DIAGNOSIS — E119 Type 2 diabetes mellitus without complications: Secondary | ICD-10-CM | POA: Insufficient documentation

## 2014-02-14 DIAGNOSIS — Z87891 Personal history of nicotine dependence: Secondary | ICD-10-CM | POA: Insufficient documentation

## 2014-02-14 DIAGNOSIS — R197 Diarrhea, unspecified: Secondary | ICD-10-CM | POA: Insufficient documentation

## 2014-02-14 DIAGNOSIS — Z79899 Other long term (current) drug therapy: Secondary | ICD-10-CM | POA: Insufficient documentation

## 2014-02-14 DIAGNOSIS — J45909 Unspecified asthma, uncomplicated: Secondary | ICD-10-CM | POA: Insufficient documentation

## 2014-02-14 DIAGNOSIS — A5400 Gonococcal infection of lower genitourinary tract, unspecified: Secondary | ICD-10-CM | POA: Insufficient documentation

## 2014-02-14 DIAGNOSIS — N83209 Unspecified ovarian cyst, unspecified side: Secondary | ICD-10-CM | POA: Insufficient documentation

## 2014-02-14 MED ORDER — FLUCONAZOLE 200 MG PO TABS: 200.0000 mg | ORAL_TABLET | Freq: Every day | ORAL | Status: AC

## 2014-02-14 MED ORDER — METRONIDAZOLE 500 MG PO TABS
500.0000 mg | ORAL_TABLET | Freq: Once | ORAL | Status: AC
  Administered 2014-02-14: 500 mg via ORAL
  Filled 2014-02-14: qty 1

## 2014-02-14 MED ORDER — DIPHENOXYLATE-ATROPINE 2.5-0.025 MG PO TABS: 1.0000 | ORAL_TABLET | Freq: Four times a day (QID) | ORAL | Status: DC | PRN

## 2014-02-14 MED ORDER — METRONIDAZOLE 500 MG PO TABS: 500.0000 mg | ORAL_TABLET | Freq: Two times a day (BID) | ORAL | Status: DC

## 2014-02-14 NOTE — ED Provider Notes (Signed)
CSN: [id number]     Arrival date & time 02/14/14  1603 History   First MD Initiated Contact with Patient 02/14/14 1619     Chief Complaint  Patient presents with  . Diarrhea      HPI  His reasons for diarrhea last 2 days. She estimates greater than 20 episodes per day. Not large volume of frequent. A lot of intestinal gas. No blood. Otherwise feels well without abdominal pain, fevers, nausea, vomiting. Seen and evaluated for chest pain 2 days ago. Has been holding her Glucophage status post her chest CT.  No travel. Is in [place]. [place]. No pets. No travel. No recent antibiotics.  Past Medical History  Diagnosis Date  . Asthma   . Ovarian cyst   . Gonorrhea   . Diabetes mellitus    Past Surgical History  Procedure Laterality Date  . Cesarean section      x3   Family History  Problem Relation Age of Onset  . Thyroid disease Mother   . Diabetes Father   . Hypertension Father   . Ovarian cancer Maternal Grandmother   . Breast cancer Maternal Grandmother   . Cancer Maternal Grandmother   . Cancer Maternal Grandfather   . Lung cancer Maternal Grandfather   . Asthma Son    History  Substance Use Topics  . Smoking status: Former Smoker -- 0.50 packs/day for 2 years    Types: Cigarettes    Quit date: 04/28/2012  . Smokeless tobacco: Not on file  . Alcohol Use: Yes     Comment: Socially   OB History   Grav Para Term Preterm Abortions TAB SAB Ect Mult Living   4 3 3  1 1    3      Review of Systems  Constitutional: Negative for fever, chills, diaphoresis, appetite change and fatigue.  HENT: Negative for mouth sores, sore throat and trouble swallowing.   Eyes: Negative for visual disturbance.  Respiratory: Negative for cough, chest tightness, shortness of breath and wheezing.   Cardiovascular: Negative for chest pain.  Gastrointestinal: Positive for diarrhea. Negative for nausea, vomiting, abdominal pain and abdominal distention.  Endocrine: Negative for  polydipsia, polyphagia and polyuria.  Genitourinary: Negative for dysuria, frequency and hematuria.  Musculoskeletal: Negative for gait problem.  Skin: Negative for color change, pallor and rash.  Neurological: Negative for dizziness, syncope, light-headedness and headaches.  Hematological: Does not bruise/bleed easily.  Psychiatric/Behavioral: Negative for behavioral problems and confusion.      Allergies  Review of patient's allergies indicates no known allergies.  Home Medications   Current Outpatient Rx  Name  Route  Sig  Dispense  Refill  . albuterol (PROVENTIL HFA;VENTOLIN HFA) 108 (90 BASE) MCG/ACT inhaler   Inhalation   Inhale 2 puffs into the lungs every 6 (six) hours as needed. For asthma syptoms          . metFORMIN (GLUCOPHAGE) 500 MG tablet   Oral   Take 500 mg by mouth 2 (two) times daily with a meal.         . diphenoxylate-atropine (LOMOTIL) 2.5-0.025 MG per tablet   Oral   Take 1 tablet by mouth 4 (four) times daily as needed for diarrhea or loose stools.   30 tablet   0   . EXPIRED: metFORMIN (GLUCOPHAGE) 500 MG tablet   Oral   Take 1 tablet (500 mg total) by mouth 2 (two) times daily with a meal.   180 tablet   3   .  metroNIDAZOLE (FLAGYL) 500 MG tablet   Oral   Take 1 tablet (500 mg total) by mouth 2 (two) times daily.   14 tablet   0    BP 132/88  Pulse 88  Temp(Src) 98.3 F (36.8 C) (Oral)  Resp 18  Ht 5\' 2"  (1.575 m)  Wt 263 lb (119.296 kg)  BMI 48.09 kg/m2  SpO2 100%  LMP 02/08/2014 Physical Exam  Constitutional: She is oriented to person, place, and time. She appears well-developed and well-nourished. No distress.  HENT:  Head: Normocephalic.  Eyes: Conjunctivae are normal. Pupils are equal, round, and reactive to light. No scleral icterus.  Neck: Normal range of motion. Neck supple. No thyromegaly present.  Cardiovascular: Normal rate and regular rhythm.  Exam reveals no gallop and no friction rub.   No murmur  heard. Pulmonary/Chest: Effort normal and breath sounds normal. No respiratory distress. She has no wheezes. She has no rales.  Abdominal: Soft. Bowel sounds are normal. She exhibits no distension. There is no tenderness. There is no rebound.  Benign abdomen. Nontender. Normal active bowel sounds.  Musculoskeletal: Normal range of motion.  Neurological: She is alert and oriented to person, place, and time.  Skin: Skin is warm and dry. No rash noted.  Psychiatric: She has a normal mood and affect. Her behavior is normal.    ED Course  Procedures (including critical care time) Labs Review Labs Reviewed - No data to display Imaging Review No results found.   EKG Interpretation None      MDM   Final diagnoses:  Diarrhea   Loose watery foamy diarrhea with a lot of intestinal gas and cramping. Plan will be Flagyl, Lomotil, fluid hydration.    Tanna Furry, MD 02/14/14 [phone]

## 2014-02-14 NOTE — Discharge Instructions (Signed)
Diarrhea Diarrhea is frequent loose and watery bowel movements. It can cause you to feel weak and dehydrated. Dehydration can cause you to become tired and thirsty, have a dry mouth, and have decreased urination that often is dark yellow. Diarrhea is a sign of another problem, most often an infection that will not last long. In most cases, diarrhea typically lasts 2 3 days. However, it can last longer if it is a sign of something more serious. It is important to treat your diarrhea as directed by your caregive to lessen or prevent future episodes of diarrhea. CAUSES  Some common causes include:  Gastrointestinal infections caused by viruses, bacteria, or parasites.  Food poisoning or food allergies.  Certain medicines, such as antibiotics, chemotherapy, and laxatives.  Artificial sweeteners and fructose.  Digestive disorders. HOME CARE INSTRUCTIONS  Ensure adequate fluid intake (hydration): have 1 cup (8 oz) of fluid for each diarrhea episode. Avoid fluids that contain simple sugars or sports drinks, fruit juices, whole milk products, and sodas. Your urine should be clear or pale yellow if you are drinking enough fluids. Hydrate with an oral rehydration solution that you can purchase at pharmacies, retail stores, and online. You can prepare an oral rehydration solution at home by mixing the following ingredients together:    tsp table salt.   tsp baking soda.   tsp salt substitute containing potassium chloride.  1  tablespoons sugar.  1 L (34 oz) of water.  Certain foods and beverages may increase the speed at which food moves through the gastrointestinal (GI) tract. These foods and beverages should be avoided and include:  Caffeinated and alcoholic beverages.  High-fiber foods, such as raw fruits and vegetables, nuts, seeds, and whole grain breads and cereals.  Foods and beverages sweetened with sugar alcohols, such as xylitol, sorbitol, and mannitol.  Some foods may be well  tolerated and may help thicken stool including:  Starchy foods, such as rice, toast, pasta, low-sugar cereal, oatmeal, grits, baked potatoes, crackers, and bagels.  Bananas.  Applesauce.  Add probiotic-rich foods to help increase healthy bacteria in the GI tract, such as yogurt and fermented milk products.  Wash your hands well after each diarrhea episode.  Only take over-the-counter or prescription medicines as directed by your caregiver.  Take a warm bath to relieve any burning or pain from frequent diarrhea episodes. SEEK IMMEDIATE MEDICAL CARE IF:   You are unable to keep fluids down.  You have persistent vomiting.  You have blood in your stool, or your stools are black and tarry.  You do not urinate in 6 8 hours, or there is only a small amount of very dark urine.  You have abdominal pain that increases or localizes.  You have weakness, dizziness, confusion, or lightheadedness.  You have a severe headache.  Your diarrhea gets worse or does not get better.  You have a fever or persistent symptoms for more than 2 3 days.  You have a fever and your symptoms suddenly get worse. MAKE SURE YOU:   Understand these instructions.  Will watch your condition.  Will get help right away if you are not doing well or get worse. Document Released: 10/26/2002 Document Revised: 10/22/2012 Document Reviewed: 07/13/2012 [employer] Patient Information 2014 [employer].  

## 2014-02-14 NOTE — ED Notes (Addendum)
Pt was seen here on Thursday for chest pain/sob. Pt had ct scan with iv dye and reports having severe diarrhea since having the ct scan. Denies any fever/chills, n/v. 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

## 2014-02-16 NOTE — ED Provider Notes (Signed)
Medical screening examination/treatment/procedure(s) were performed by non-physician practitioner and as supervising physician I was immediately available for consultation/collaboration.   EKG Interpretation   Date/Time:  Thursday February 11 2014 21:03:49 EDT Ventricular Rate:  104 PR Interval:  130 QRS Duration: 84 QT Interval:  344 QTC Calculation: 452 R Axis:   80 Text Interpretation:  Sinus tachycardia T wave abnormality, consider  inferior ischemia Abnormal ECG ED PHYSICIAN INTERPRETATION AVAILABLE IN  [facility] Confirmed by TEST, Record ([id number]) on 02/14/2014 12:50:37 PM        Houston Siren III, MD 02/16/14 [phone]

## 2014-03-29 ENCOUNTER — Encounter (HOSPITAL_COMMUNITY): Payer: Self-pay | Admitting: *Deleted

## 2014-03-29 ENCOUNTER — Inpatient Hospital Stay (HOSPITAL_COMMUNITY)
Admission: AD | Admit: 2014-03-29 | Discharge: 2014-03-30 | Disposition: A | Discharge status: Home / Self Care | Payer: Medicaid Other | Source: Ambulatory Visit | Attending: Family Medicine | Admitting: Family Medicine

## 2014-03-29 DIAGNOSIS — Z87891 Personal history of nicotine dependence: Secondary | ICD-10-CM | POA: Insufficient documentation

## 2014-03-29 DIAGNOSIS — B3731 Acute candidiasis of vulva and vagina: Secondary | ICD-10-CM | POA: Insufficient documentation

## 2014-03-29 DIAGNOSIS — Z3201 Encounter for pregnancy test, result positive: Secondary | ICD-10-CM | POA: Insufficient documentation

## 2014-03-29 DIAGNOSIS — B372 Candidiasis of skin and nail: Secondary | ICD-10-CM

## 2014-03-29 DIAGNOSIS — E119 Type 2 diabetes mellitus without complications: Secondary | ICD-10-CM | POA: Insufficient documentation

## 2014-03-29 DIAGNOSIS — B373 Candidiasis of vulva and vagina: Secondary | ICD-10-CM | POA: Insufficient documentation

## 2014-03-29 HISTORY — DX: Chlamydial infection, unspecified: A74.9

## 2014-03-29 NOTE — MAU Note (Signed)
Patient c/o of vaginal irritation and itching. States she just found out her husband was unfaithful and she would like to be tested for STIs.

## 2014-03-30 ENCOUNTER — Encounter (HOSPITAL_COMMUNITY): Payer: Self-pay | Admitting: *Deleted

## 2014-03-30 DIAGNOSIS — B372 Candidiasis of skin and nail: Secondary | ICD-10-CM

## 2014-03-30 LAB — URINALYSIS, ROUTINE W REFLEX MICROSCOPIC
BILIRUBIN URINE: NEGATIVE
Glucose, UA: >1000 mg/dL — AB
Hgb urine dipstick: NEGATIVE
Ketones, ur: 15 mg/dL — AB
Leukocytes, UA: NEGATIVE
NITRITE: NEGATIVE
Protein, ur: NEGATIVE mg/dL
Specific Gravity, Urine: 1.02 (ref 1.005–1.030)
UROBILINOGEN UA: 0.2 mg/dL (ref 0.0–1.0)
pH: 6 (ref 5.0–8.0)

## 2014-03-30 LAB — WET PREP, GENITAL
Clue Cells Wet Prep HPF POC: NONE SEEN
Trich, Wet Prep: NONE SEEN
Yeast Wet Prep HPF POC: NONE SEEN

## 2014-03-30 LAB — HIV ANTIBODY (ROUTINE TESTING W REFLEX): HIV: NONREACTIVE

## 2014-03-30 LAB — POCT PREGNANCY, URINE: PREG TEST UR: POSITIVE — AB

## 2014-03-30 LAB — URINE MICROSCOPIC-ADD ON

## 2014-03-30 LAB — OB RESULTS CONSOLE GC/CHLAMYDIA
CHLAMYDIA, DNA PROBE: NEGATIVE
GC PROBE AMP, GENITAL: NEGATIVE

## 2014-03-30 LAB — GLUCOSE, CAPILLARY: GLUCOSE-CAPILLARY: 238 mg/dL — AB (ref 70–99)

## 2014-03-30 LAB — GC/CHLAMYDIA PROBE AMP
CT PROBE, AMP APTIMA: NEGATIVE
GC Probe RNA: NEGATIVE

## 2014-03-30 LAB — HCG, QUANTITATIVE, PREGNANCY: hCG, Beta Chain, Quant, S: 106 m[IU]/mL — ABNORMAL HIGH (ref <5)

## 2014-03-30 MED ORDER — TRIAMCINOLONE ACETONIDE 0.1 % EX CREA: 1.0000 "application " | TOPICAL_CREAM | Freq: Two times a day (BID) | CUTANEOUS | Status: DC

## 2014-03-30 MED ORDER — NYSTATIN 100000 UNIT/GM EX CREA: TOPICAL_CREAM | CUTANEOUS | Status: DC

## 2014-03-30 NOTE — Discharge Instructions (Signed)
Cutaneous Candidiasis Cutaneous candidiasis is a condition in which there is an overgrowth of yeast (candida) on the skin. Yeast normally live on the skin, but in small enough numbers not to cause any symptoms. In certain cases, increased growth of the yeast may cause an actual yeast infection. This kind of infection usually occurs in areas of the skin that are constantly warm and moist, such as the armpits or the groin. Yeast is the most common cause of diaper rash in babies and in people who cannot control their bowel movements (incontinence). CAUSES  The fungus that most often causes cutaneous candidiasis is Candida albicans. Conditions that can increase the risk of getting a yeast infection of the skin include:  Obesity.  Pregnancy.  Diabetes.  Taking antibiotic medicine.  Taking birth control pills.  Taking steroid medicines.  Thyroid disease.  An iron or zinc deficiency.  Problems with the immune system. SYMPTOMS   Red, swollen area of the skin.  Bumps on the skin.  Itchiness. DIAGNOSIS  The diagnosis of cutaneous candidiasis is usually based on its appearance. Light scrapings of the skin may also be taken and viewed under a microscope to identify the presence of yeast. TREATMENT  Antifungal creams may be applied to the infected skin. In severe cases, oral medicines may be needed.  HOME CARE INSTRUCTIONS   Keep your skin clean and dry.  Maintain a healthy weight.  If you have diabetes, keep your blood sugar under control. SEEK IMMEDIATE MEDICAL CARE IF:  Your rash continues to spread despite treatment.  You have a fever, chills, or abdominal pain. Document Released: 07/24/2011 Document Revised: 01/28/2012 Document Reviewed: 07/24/2011 [facility] Patient Information 2014 [place]. Pregnancy, First Trimester The first trimester is the first 3 months your baby is growing inside you. It is important to follow your doctor's instructions. HOME CARE   Do not  smoke.  Do not drink alcohol.  Only take medicine as told by your doctor.  Exercise.  Eat healthy foods. Eat regular, well-balanced meals.  You can have sex (intercourse) if there are no other problems with the pregnancy.  Things that help with morning sickness:  Eat soda crackers before getting up in the morning.  Eat 4 to 5 small meals rather than 3 large meals.  Drink liquids between meals, not during meals.  Go to all appointments as told.  Take all vitamins or supplements as told by your doctor. GET HELP RIGHT AWAY IF:   You develop a fever.  You have a bad smelling fluid that is leaking from your vagina.  There is bleeding from the vagina.  You develop severe belly (abdominal) or back pain.  You throw up (vomit) blood. It may look like coffee grounds.  You lose more than 2 pounds in a week.  You gain 5 pounds or more in a week.  You gain more than 2 pounds in a week and you see puffiness (swelling) in your feet, ankles, or legs.  You have severe dizziness or pass out (faint).  You are around people who have [place] measles, chickenpox, or fifth disease.  You have a headache, watery poop (diarrhea), pain with peeing (urinating), or cannot breath right. Document Released: 04/23/2008 Document Revised: 01/28/2012 Document Reviewed: 04/23/2008 [facility] Patient Information 2014 [place], [place].

## 2014-03-30 NOTE — MAU Provider Note (Signed)
History     CSN: [id number]  Arrival date and time: 03/29/14 2331   First Provider Initiated Contact with Patient 03/30/14 0001      No chief complaint on file.  HPI Ms. Carmen Watts is a 33 y.o. [phone] who presents to MAU today with complaint of vaginal irritation. The patient is also requesting STI testing because she recently found out that her husband has been unfaithful. She states that she has had vulvar itching, irritation and burning occasionally. She denies abnormal discharge or bleeding. She has a history of irregular periods. LMP was 02/15/14. She denies N/V/D, fever or pelvic pain. Patient is a diabetic on Metformin managed by Dr. Edilia Bo. She states that she has continued to have elevated blood sugars and is working on [employer].    OB History   Grav Para Term Preterm Abortions TAB SAB Ect Mult Living   5 3 3  1 1    3       Past Medical History  Diagnosis Date  . Asthma   . Ovarian cyst   . Gonorrhea   . Diabetes mellitus   . Chlamydia     Past Surgical History  Procedure Laterality Date  . Cesarean section      x3    Family History  Problem Relation Age of Onset  . Thyroid disease Mother   . Diabetes Father   . Hypertension Father   . Ovarian cancer Maternal Grandmother   . Breast cancer Maternal Grandmother   . Cancer Maternal Grandmother   . Cancer Maternal Grandfather   . Lung cancer Maternal Grandfather   . Asthma Son     History  Substance Use Topics  . Smoking status: Former Smoker -- 0.50 packs/day for 2 years    Types: Cigarettes    Quit date: 04/28/2012  . Smokeless tobacco: Not on file  . Alcohol Use: Yes     Comment: Socially    Allergies: No Known Allergies  Prescriptions prior to admission  Medication Sig Dispense Refill  . metFORMIN (GLUCOPHAGE) 500 MG tablet Take 500 mg by mouth 2 (two) times daily with a meal.      . albuterol (PROVENTIL HFA;VENTOLIN HFA) 108 (90 BASE) MCG/ACT inhaler Inhale 2 puffs into the lungs every  6 (six) hours as needed. For asthma syptoms       . diphenoxylate-atropine (LOMOTIL) 2.5-0.025 MG per tablet Take 1 tablet by mouth 4 (four) times daily as needed for diarrhea or loose stools.  30 tablet  0  . metFORMIN (GLUCOPHAGE) 500 MG tablet Take 1 tablet (500 mg total) by mouth 2 (two) times daily with a meal.  180 tablet  3  . metroNIDAZOLE (FLAGYL) 500 MG tablet Take 1 tablet (500 mg total) by mouth 2 (two) times daily.  14 tablet  0    Review of Systems  Constitutional: Negative for fever and malaise/fatigue.  Gastrointestinal: Negative for nausea, vomiting, abdominal pain, diarrhea and constipation.  Genitourinary: Positive for dysuria. Negative for urgency and frequency.       + discharge Neg - vaginal bleeding   Physical Exam   Blood pressure 127/81, pulse 93, temperature 98.1 F (36.7 C), temperature source Oral, resp. rate 18, height 5\' 2"  (1.575 m), weight 266 lb (120.657 kg), last menstrual period 02/15/2014, SpO2 100.00%.  Physical Exam  Constitutional: She is oriented to person, place, and time. She appears well-developed and well-nourished. No distress.  HENT:  Head: Normocephalic and atraumatic.  Cardiovascular: Normal rate.   Respiratory:  Effort normal.  GI: Soft. She exhibits no distension and no mass. There is no tenderness. There is no rebound and no guarding.  Genitourinary: Uterus is not enlarged and not tender. Cervix exhibits no motion tenderness, no discharge and no friability. Right adnexum displays no mass and no tenderness. Left adnexum displays no mass and no tenderness. No bleeding around the vagina. Vaginal discharge (scant thin, white discharge noted) found.  Neurological: She is alert and oriented to person, place, and time.  Skin: Skin is warm and dry. No erythema.  Psychiatric: She has a normal mood and affect.   Results for orders placed during the hospital encounter of 03/29/14 (from the past 24 hour(s))  URINALYSIS, ROUTINE W REFLEX MICROSCOPIC      Status: Abnormal   Collection Time    03/30/14 12:16 AM      Result Value Ref Range   Color, Urine YELLOW  YELLOW   APPearance CLEAR  CLEAR   Specific Gravity, Urine 1.020  1.005 - 1.030   pH 6.0  5.0 - 8.0   Glucose, UA >1000 (*) NEGATIVE mg/dL   Hgb urine dipstick NEGATIVE  NEGATIVE   Bilirubin Urine NEGATIVE  NEGATIVE   Ketones, ur 15 (*) NEGATIVE mg/dL   Protein, ur NEGATIVE  NEGATIVE mg/dL   Urobilinogen, UA 0.2  0.0 - 1.0 mg/dL   Nitrite NEGATIVE  NEGATIVE   Leukocytes, UA NEGATIVE  NEGATIVE  URINE MICROSCOPIC-ADD ON     Status: Abnormal   Collection Time    03/30/14 12:16 AM      Result Value Ref Range   Squamous Epithelial / LPF FEW (*) RARE   WBC, UA 0-2  <3 WBC/hpf   RBC / HPF 0-2  <3 RBC/hpf   Bacteria, UA RARE  RARE  WET PREP, GENITAL     Status: Abnormal   Collection Time    03/30/14 12:21 AM      Result Value Ref Range   Yeast Wet Prep HPF POC NONE SEEN  NONE SEEN   Trich, Wet Prep NONE SEEN  NONE SEEN   Clue Cells Wet Prep HPF POC NONE SEEN  NONE SEEN   WBC, Wet Prep HPF POC FEW (*) NONE SEEN  HCG, QUANTITATIVE, PREGNANCY     Status: Abnormal   Collection Time    03/30/14 12:25 AM      Result Value Ref Range   hCG, Beta Chain, Quant, S 106 (*) <5 mIU/mL  POCT PREGNANCY, URINE     Status: Abnormal   Collection Time    03/30/14 12:37 AM      Result Value Ref Range   Preg Test, Ur POSITIVE (*) NEGATIVE    MAU Course  Procedures None  MDM + UPT. Quant hCG to confirm.  UA, wet prep, GC/Chlamydia and HIV today  Assessment and Plan  A: Positive pregnancy test Cutaneous candidiasis DM II, not well-controlled  P: Discharge home Rx for Nystatin and Triamcinolone sent to patient's pharmacy for external use only First trimester warning signs discused Patient plans to return to Dr. Ruthann Cancer. Will call tomorrow since she is high risk Patient advised to call Dr. Edilia Bo and inform him of pregnancy and continued high glucose Contact information for  [facility] given in case Dr. Ruthann Cancer cannot see patient  Patient may return to MAU as needed or if her condition were to change or worsen  Farris Has, PA-C  03/30/2014, 1:26 AM

## 2014-03-30 NOTE — MAU Provider Note (Signed)
Attestation of Attending Supervision of Advanced Practitioner (PA/CNM/NP): Evaluation and management procedures were performed by the Advanced Practitioner under my supervision and collaboration.  I have reviewed the Advanced Practitioner's note and chart, and I agree with the management and plan.  [name], MD [facility] Attending 03/30/2014 2:07 AM

## 2014-04-19 ENCOUNTER — Ambulatory Visit (INDEPENDENT_AMBULATORY_CARE_PROVIDER_SITE_OTHER): Payer: Medicaid Other | Admitting: Obstetrics & Gynecology

## 2014-04-19 ENCOUNTER — Encounter: Payer: Medicaid Other | Attending: Family Medicine | Admitting: *Deleted

## 2014-04-19 VITALS — Wt 262.0 lb

## 2014-04-19 DIAGNOSIS — R7309 Other abnormal glucose: Secondary | ICD-10-CM

## 2014-04-19 DIAGNOSIS — E119 Type 2 diabetes mellitus without complications: Secondary | ICD-10-CM

## 2014-04-19 DIAGNOSIS — Z713 Dietary counseling and surveillance: Secondary | ICD-10-CM | POA: Insufficient documentation

## 2014-04-19 DIAGNOSIS — O24919 Unspecified diabetes mellitus in pregnancy, unspecified trimester: Secondary | ICD-10-CM | POA: Diagnosis not present

## 2014-04-19 DIAGNOSIS — R7302 Impaired glucose tolerance (oral): Secondary | ICD-10-CM

## 2014-04-19 DIAGNOSIS — O34219 Maternal care for unspecified type scar from previous cesarean delivery: Secondary | ICD-10-CM

## 2014-04-19 LAB — POCT URINALYSIS DIP (DEVICE)
Bilirubin Urine: NEGATIVE
Glucose, UA: 500 mg/dL — AB
Hgb urine dipstick: NEGATIVE
Ketones, ur: NEGATIVE mg/dL
Leukocytes, UA: NEGATIVE
Nitrite: NEGATIVE
PH: 6 (ref 5.0–8.0)
PROTEIN: NEGATIVE mg/dL
SPECIFIC GRAVITY, URINE: 1.025 (ref 1.005–1.030)
Urobilinogen, UA: 0.2 mg/dL (ref 0.0–1.0)

## 2014-04-19 MED ORDER — ACCU-CHEK FASTCLIX LANCETS MISC: 1.0000 | Freq: Four times a day (QID) | Status: DC

## 2014-04-19 MED ORDER — GLUCOSE BLOOD VI STRP: ORAL_STRIP | Status: DC

## 2014-04-19 NOTE — Progress Notes (Signed)
Nutrition note: RD/ CDE visit only/ DM diet education Pt has h/o Type 2 DM for 3 yrs & h/o obesity. Pt has not been following diet. Pt has lost 13# @ 9w due to severe N/V. Pt reports eating 2-3 meals & 2 snacks/d. Pt is drinking ~4 bottles of green tea (sugar)/ d. Pt has not started taking a PNV yet. Pt reports having a lot of N/V and some heartburn.  Pt received verbal & written education on DM diet during pregnancy. Discussed tips to decrease N/V and heartburn. Encouraged pt to decrease/ DC green tea/ sugar sweetened beverages. Encouraged pt to start taking a PNV. Discussed wt gain goals of 11-20# or 0.5#/wk in 2nd & 3rd trimester. Pt agrees to follow GDM diet with 3 meals & 2-3 snacks/d with proper CHO/ protein combination. Pt does not have [place] but plans to apply. Pt plans to BF. F/u in 4-6 wks Vladimir Faster, MS, RD, LDN, [facility]

## 2014-04-19 NOTE — Progress Notes (Signed)
DIABETES: Patient presents for DSME r/t T2DM and new pregnancy. Was seen in MAU 5/11 DX pregnancy. Patient states that she was advised to discontinue Metformin until evaluated in [facility]. No documentation found to support. Patient DX T2DM approx 3 yrs ago. Denies any formal education. Eats at will. Drinks Sweet Tea(sugar) and regular soda. No exercise at this time. Glucose 7 day avg 164, 14 day avg 174, 30 day ave 174. FBS this AM 158mg /dl. In review of dietary intake education and modifications needed. Patient has appt with provider for initial visit in 1 week. Will see dietitian today. Will test glucose FBS, and 2hpp breakfast, lunch and dinner. Patient presently using [employer] glucometer which is not covered by Medicaid. Dispensed Accu-ck Nano Lot: [id number], Exp: 02/17/15. Will provide order for supplies. Patient accompanied by significant other who has family that have diabetes. He notes that she has been overtly noncompliant. He was chastising her harshly while in treatment area. Patient discussed with him and he left the treatment area.

## 2014-04-26 ENCOUNTER — Encounter: Payer: Self-pay | Admitting: Family Medicine

## 2014-04-26 ENCOUNTER — Ambulatory Visit (INDEPENDENT_AMBULATORY_CARE_PROVIDER_SITE_OTHER): Payer: Medicaid Other | Admitting: Family Medicine

## 2014-04-26 ENCOUNTER — Encounter: Payer: Medicaid Other | Admitting: *Deleted

## 2014-04-26 VITALS — BP 135/84 | HR 90 | Temp 97.6°F | Wt 264.3 lb

## 2014-04-26 DIAGNOSIS — O34219 Maternal care for unspecified type scar from previous cesarean delivery: Secondary | ICD-10-CM

## 2014-04-26 DIAGNOSIS — O0990 Supervision of high risk pregnancy, unspecified, unspecified trimester: Secondary | ICD-10-CM

## 2014-04-26 DIAGNOSIS — O24911 Unspecified diabetes mellitus in pregnancy, first trimester: Secondary | ICD-10-CM

## 2014-04-26 DIAGNOSIS — O24919 Unspecified diabetes mellitus in pregnancy, unspecified trimester: Secondary | ICD-10-CM

## 2014-04-26 DIAGNOSIS — E119 Type 2 diabetes mellitus without complications: Secondary | ICD-10-CM

## 2014-04-26 LAB — POCT URINALYSIS DIP (DEVICE)
GLUCOSE, UA: NEGATIVE mg/dL
Hgb urine dipstick: NEGATIVE
Ketones, ur: 15 mg/dL — AB
Nitrite: NEGATIVE
Protein, ur: 30 mg/dL — AB
UROBILINOGEN UA: 0.2 mg/dL (ref 0.0–1.0)
pH: 5.5 (ref 5.0–8.0)

## 2014-04-26 LAB — HEMOGLOBIN A1C
Hgb A1c MFr Bld: 8.1 % — ABNORMAL HIGH (ref <5.7)
Mean Plasma Glucose: 186 mg/dL — ABNORMAL HIGH (ref <117)

## 2014-04-26 MED ORDER — INSULIN ASPART 100 UNIT/ML FLEXPEN: 17.0000 [IU] | PEN_INJECTOR | Freq: Three times a day (TID) | SUBCUTANEOUS | Status: DC

## 2014-04-26 MED ORDER — "INSULIN SYRINGE 31G X 5/16"" 1 ML MISC": 1.0000 | Freq: Two times a day (BID) | Status: DC

## 2014-04-26 MED ORDER — ASPIRIN 81 MG PO CHEW: 81.0000 mg | CHEWABLE_TABLET | Freq: Every day | ORAL | Status: DC

## 2014-04-26 MED ORDER — INSULIN NPH (HUMAN) (ISOPHANE) 100 UNIT/ML ~~LOC~~ SUSP: 15.0000 [IU] | Freq: Two times a day (BID) | SUBCUTANEOUS | Status: DC

## 2014-04-26 NOTE — Progress Notes (Signed)
Saw nutrition and diabetes education last week

## 2014-04-26 NOTE — Patient Instructions (Signed)
Pregnancy - First Trimester During sexual intercourse, millions of sperm go into the vagina. Only 1 sperm will penetrate and fertilize the female egg while it is in the Fallopian tube. One week later, the fertilized egg implants into the wall of the uterus. An embryo begins to develop into a baby. At 6 to 8 weeks, the eyes and face are formed and the heartbeat can be seen on ultrasound. At the end of 12 weeks (first trimester), all the baby's organs are formed. Now that you are pregnant, you will want to do everything you can to have a healthy baby. Two of the most important things are to get good prenatal care and follow your caregiver's instructions. Prenatal care is all the medical care you receive before the baby's birth. It is given to prevent, find, and treat problems during the pregnancy and childbirth. PRENATAL EXAMS  During prenatal visits, your weight, blood pressure, and urine are checked. This is done to make sure you are healthy and progressing normally during the pregnancy.  A pregnant woman should gain 25 to 35 pounds during the pregnancy. However, if you are overweight or underweight, your caregiver will advise you regarding your weight.  Your caregiver will ask and answer questions for you.  Blood work, cervical cultures, other necessary tests, and a Pap test are done during your prenatal exams. These tests are done to check on your health and the probable health of your baby. Tests are strongly recommended and done for HIV with your permission. This is the virus that causes AIDS. These tests are done because medicines can be given to help prevent your baby from being born with this infection should you have been infected without knowing it. Blood work is also used to find out your blood type, previous infections, and follow your blood levels (hemoglobin).  Low hemoglobin (anemia) is common during pregnancy. Iron and vitamins are given to help prevent this. Later in the pregnancy,  blood tests for diabetes will be done along with any other tests if any problems develop.  You may need other tests to make sure you and the baby are doing well. CHANGES DURING THE FIRST TRIMESTER  Your body goes through many changes during pregnancy. They vary from person to person. Talk to your caregiver about changes you notice and are concerned about. Changes can include:  Your menstrual period stops.  The egg and sperm carry the genes that determine what you look like. Genes from you and your partner are forming a baby. The female genes determine whether the baby is a boy or a girl.  Your body increases in girth and you may feel bloated.  Feeling sick to your stomach (nauseous) and throwing up (vomiting). If the vomiting is uncontrollable, call your caregiver.  Your breasts will begin to enlarge and become tender.  Your nipples may stick out more and become darker.  The need to urinate more. Painful urination may mean you have a bladder infection.  Tiring easily.  Loss of appetite.  Cravings for certain kinds of food.  At first, you may gain or lose a couple of pounds.  You may have changes in your emotions from day to day (excited to be pregnant or concerned something may go wrong with the pregnancy and baby).  You may have more vivid and strange dreams. HOME CARE INSTRUCTIONS   It is very important to avoid all smoking, alcohol and non-prescribed drugs during your pregnancy. These affect the formation and growth of the baby.  Avoid chemicals while pregnant to ensure the delivery of a healthy infant.  Start your prenatal visits by the 12th week of pregnancy. They are usually scheduled monthly at first, then more often in the last 2 months before delivery. Keep your caregiver's appointments. Follow your caregiver's instructions regarding medicine use, blood and lab tests, exercise, and diet.  During pregnancy, you are providing food for you and your baby. Eat regular,  well-balanced meals. Choose foods such as meat, fish, milk and other low fat dairy products, vegetables, fruits, and whole-grain breads and cereals. Your caregiver will tell you of the ideal weight gain.  You can help morning sickness by keeping soda crackers at the bedside. Eat a couple before arising in the morning. You may want to use the crackers without salt on them.  Eating 4 to 5 small meals rather than 3 large meals a day also may help the nausea and vomiting.  Drinking liquids between meals instead of during meals also seems to help nausea and vomiting.  A physical sexual relationship may be continued throughout pregnancy if there are no other problems. Problems may be early (premature) leaking of amniotic fluid from the membranes, vaginal bleeding, or belly (abdominal) pain.  Exercise regularly if there are no restrictions. Check with your caregiver or physical therapist if you are unsure of the safety of some of your exercises. Greater weight gain will occur in the last 2 trimesters of pregnancy. Exercising will help:  Control your weight.  Keep you in shape.  Prepare you for labor and delivery.  Help you lose your pregnancy weight after you deliver your baby.  Wear a good support or jogging bra for breast tenderness during pregnancy. This may help if worn during sleep too.  Ask when prenatal classes are available. Begin classes when they are offered.  Do not use hot tubs, steam rooms, or saunas.  Wear your seat belt when driving. This protects you and your baby if you are in an accident.  Avoid raw meat, uncooked cheese, cat litter boxes, and soil used by cats throughout the pregnancy. These carry germs that can cause birth defects in the baby.  The first trimester is a good time to visit your dentist for your dental health. Getting your teeth cleaned is okay. Use a softer toothbrush and brush gently during pregnancy.  Ask for help if you have financial, counseling, or  nutritional needs during pregnancy. Your caregiver will be able to offer counseling for these needs as well as refer you for other special needs.  Do not take any medicines or herbs unless told by your caregiver.  Inform your caregiver if there is any mental or physical domestic violence.  Make a list of emergency phone numbers of family, friends, hospital, and police and fire departments.  Write down your questions. Take them to your prenatal visit.  Do not douche.  Do not cross your legs.  If you have to stand for long periods of time, rotate you feet or take small steps in a circle.  You may have more vaginal secretions that may require a sanitary pad. Do not use tampons or scented sanitary pads. MEDICINES AND DRUG USE IN PREGNANCY  Take prenatal vitamins as directed. The vitamin should contain 1 milligram of folic acid. Keep all vitamins out of reach of children. Only a couple vitamins or tablets containing iron may be fatal to a baby or young child when ingested.  Avoid use of all medicines, including herbs, over-the-counter medicines, not  prescribed or suggested by your caregiver. Only take over-the-counter or prescription medicines for pain, discomfort, or fever as directed by your caregiver. Do not use aspirin, ibuprofen, or naproxen unless directed by your caregiver.  Let your caregiver also know about herbs you may be using.  Alcohol is related to a number of birth defects. This includes fetal alcohol syndrome. All alcohol, in any form, should be avoided completely. Smoking will cause low birth rate and premature babies.  Street or illegal drugs are very harmful to the baby. They are absolutely forbidden. A baby born to an addicted mother will be addicted at birth. The baby will go through the same withdrawal an adult does.  Let your caregiver know about any medicines that you have to take and for what reason you take them. SEEK MEDICAL CARE IF:  You have any concerns or  worries during your pregnancy. It is better to call with your questions if you feel they cannot wait, rather than worry about them. SEEK IMMEDIATE MEDICAL CARE IF:   An unexplained oral temperature above 102 F (38.9 C) develops, or as your caregiver suggests.  You have leaking of fluid from the vagina (birth canal). If leaking membranes are suspected, take your temperature and inform your caregiver of this when you call.  There is vaginal spotting or bleeding. Notify your caregiver of the amount and how many pads are used.  You develop a bad smelling vaginal discharge with a change in the color.  You continue to feel sick to your stomach (nauseated) and have no relief from remedies suggested. You vomit blood or coffee ground-like materials.  You lose more than 2 pounds of weight in 1 week.  You gain more than 2 pounds of weight in 1 week and you notice swelling of your face, hands, feet, or legs.  You gain 5 pounds or more in 1 week (even if you do not have swelling of your hands, face, legs, or feet).  You get exposed to [place] measles and have never had them.  You are exposed to fifth disease or chickenpox.  You develop belly (abdominal) pain. Round ligament discomfort is a common non-cancerous (benign) cause of abdominal pain in pregnancy. Your caregiver still must evaluate this.  You develop headache, fever, diarrhea, pain with urination, or shortness of breath.  You fall or are in a car accident or have any kind of trauma.  There is mental or physical violence in your home. Document Released: 10/30/2001 Document Revised: 07/30/2012 Document Reviewed: 05/03/2009 [facility] Patient Information 2014 [place].  Gestational Diabetes Mellitus Gestational diabetes mellitus, often simply referred to as gestational diabetes, is a type of diabetes that some women develop during pregnancy. In gestational diabetes, the pancreas does not make enough insulin (a hormone), the cells are  less responsive to the insulin that is made (insulin resistance), or both.Normally, insulin moves sugars from food into the tissue cells. The tissue cells use the sugars for energy. The lack of insulin or the lack of normal response to insulin causes excess sugars to build up in the blood instead of going into the tissue cells. As a result, high blood sugar (hyperglycemia) develops. The effect of high sugar (glucose) levels can cause many complications.  RISK FACTORS You have an increased chance of developing gestational diabetes if you have a family history of diabetes and also have one or more of the following risk factors:  A body mass index over 30 (obesity).  A previous pregnancy with gestational diabetes.  An older age at the time of pregnancy. If blood glucose levels are kept in the normal range during pregnancy, women can have a healthy pregnancy. If your blood glucose levels are not well controlled, there may be risks to you, your unborn baby (fetus), your labor and delivery, or your newborn baby.  SYMPTOMS  If symptoms are experienced, they are much like symptoms you would normally expect during pregnancy. The symptoms of gestational diabetes include:   Increased thirst (polydipsia).  Increased urination (polyuria).  Increased urination during the night (nocturia).  Weight loss. This weight loss may be rapid.  Frequent, recurring infections.  Tiredness (fatigue).  Weakness.  Vision changes, such as blurred vision.  Fruity smell to your breath.  Abdominal pain. DIAGNOSIS Diabetes is diagnosed when blood glucose levels are increased. Your blood glucose level may be checked by one or more of the following blood tests:  A fasting blood glucose test. You will not be allowed to eat for at least 8 hours before a blood sample is taken.  A random blood glucose test. Your blood glucose is checked at any time of the day regardless of when you ate.  A hemoglobin A1c blood  glucose test. A hemoglobin A1c test provides information about blood glucose control over the previous 3 months.  An oral glucose tolerance test (OGTT). Your blood glucose is measured after you have not eaten (fasted) for 1 3 hours and then after you drink a glucose-containing beverage. Since the hormones that cause insulin resistance are highest at about 24 28 weeks of a pregnancy, an OGTT is usually performed during that time. If you have risk factors for gestational diabetes, your caregiver may test you for gestational diabetes earlier than 24 weeks of pregnancy. TREATMENT   You will need to take diabetes medicine or insulin daily to keep blood glucose levels in the desired range.  You will need to match insulin dosing with exercise and healthy food choices. The treatment goal is to maintain the before meal (preprandial), bedtime, and overnight blood glucose level at 60 99 mg/dL during pregnancy. The treatment goal is to further maintain peak after meal blood sugar (postprandial glucose) level at 100 140 mg/dL. HOME CARE INSTRUCTIONS   Have your hemoglobin A1c level checked twice a year.  Perform daily blood glucose monitoring as directed by your caregiver. It is common to perform frequent blood glucose monitoring.  Monitor urine ketones when you are ill and as directed by your caregiver.  Take your diabetes medicine and insulin as directed by your caregiver to maintain your blood glucose level in the desired range.  Never run out of diabetes medicine or insulin. It is needed every day.  Adjust insulin based on your intake of carbohydrates. Carbohydrates can raise blood glucose levels but need to be included in your diet. Carbohydrates provide vitamins, minerals, and fiber which are an essential part of a healthy diet. Carbohydrates are found in fruits, vegetables, whole grains, dairy products, legumes, and foods containing added sugars.    Eat healthy foods. Alternate 3 meals with 3  snacks.  Maintain a healthy weight gain. The usual total expected weight gain varies according to your prepregnancy body mass index (BMI).  Carry a medical alert card or wear your medical alert jewelry.  Carry a 15 gram carbohydrate snack with you at all times to treat low blood glucose (hypoglycemia). Some examples of 15 gram carbohydrate snacks include:  Glucose tablets, 3 or 4   Glucose gel, 15 gram tube  Raisins, 2 tablespoons (24 g)  Jelly beans, 6  Animal crackers, 8  Fruit juice, regular soda, or low fat milk, 4 ounces (120 mL)  Gummy treats, 9    Recognize hypoglycemia. Hypoglycemia during pregnancy occurs with blood glucose levels of 60 mg/dL and below. The risk for hypoglycemia increases when fasting or skipping meals, during or after intense exercise, and during sleep. Hypoglycemia symptoms can include:  Tremors or shakes.  Decreased ability to concentrate.  Sweating.  Increased heart rate.  Headache.  Dry mouth.  Hunger.  Irritability.  Anxiety.  Restless sleep.  Altered speech or coordination.  Confusion.  Treat hypoglycemia promptly. If you are alert and able to safely swallow, follow the 15:15 rule:  Take 15 20 grams of rapid-acting glucose or carbohydrate. Rapid-acting options include glucose gel, glucose tablets, or 4 ounces (120 mL) of fruit juice, regular soda, or low fat milk.  Check your blood glucose level 15 minutes after taking the glucose.   Take 15 20 grams more of glucose if the repeat blood glucose level is still 70 mg/dL or below.  Eat a meal or snack within 1 hour once blood glucose levels return to normal.  Be alert to polyuria and polydipsia which are early signs of hyperglycemia. An early awareness of hyperglycemia allows for prompt treatment. Treat hyperglycemia as directed by your caregiver.  Engage in at least 30 minutes of physical activity a day or as directed by your caregiver. Ten minutes of physical activity  timed 30 minutes after each meal is encouraged to control postprandial blood glucose levels.  Adjust your insulin dosing and food intake as needed if you start a new exercise or sport.  Follow your sick day plan at any time you are unable to eat or drink as usual.  Avoid tobacco and alcohol use.  Follow up with your caregiver regularly.  Follow the advice of your caregiver regarding your prenatal and post-delivery (postpartum) appointments, meal planning, exercise, medicines, vitamins, blood tests, other medical tests, and physical activities.  Perform daily skin and foot care. Examine your skin and feet daily for cuts, bruises, redness, nail problems, bleeding, blisters, or sores.  Brush your teeth and gums at least twice a day and floss at least once a day. Follow up with your dentist regularly.  Schedule an eye exam during the first trimester of your pregnancy or as directed by your caregiver.  Share your diabetes management plan with your workplace or school.  Stay up-to-date with immunizations.  Learn to manage stress.  Obtain ongoing diabetes education and support as needed. SEEK MEDICAL CARE IF:   You are unable to eat food or drink fluids for more than 6 hours.  You have nausea and vomiting for more than 6 hours.  You have a blood glucose level of 200 mg/dL and you have ketones in your urine.  There is a change in mental status.  You develop vision problems.  You have a persistent headache.  You have upper abdominal pain or discomfort.  You develop an additional serious illness.  You have diarrhea for more than 6 hours.  You have been sick or have had a fever for a couple of days and are not getting better. SEEK IMMEDIATE MEDICAL CARE IF:   You have difficulty breathing.  You no longer feel the baby moving.  You are bleeding or have discharge from your vagina.  You start having premature contractions or labor. MAKE SURE YOU:  Understand these  instructions.  Will watch  your condition.  Will get help right away if you are not doing well or get worse. Document Released: 02/11/2001 Document Revised: 03/02/2013 Document Reviewed: 06/03/2012 [facility] Patient Information 2014 [place], [place].

## 2014-04-26 NOTE — Progress Notes (Signed)
NT scheduled with MFM on 05/07/14 at 8 am. Diabetic  Eye Exam with [facility] scheduled 05/11/14 at 830 am.

## 2014-04-26 NOTE — Progress Notes (Signed)
Subjective:    Carmen Watts is a [id number] [redacted]w[redacted]d being seen today for her first obstetrical visit.  Her obstetrical history is significant for obesity and pre-exisiting diabetes, previous C-section x 3. Pregnancy history fully reviewed. Seen by diabetes educator last week.  Stopped her Glucophage and on diet. FBS 128-179 2 hour pp 130-190  Patient reports no complaints.  Filed Vitals:   04/26/14 0922  BP: 135/84  Pulse: 90  Temp: 97.6 F (36.4 C)  Weight: 264 lb 4.8 oz (119.886 kg)    HISTORY: OB History  Gravida Para Term Preterm AB SAB TAB Ectopic Multiple Living  5 3 3  0 1 0 1 0 0 3    # Outcome Date GA Lbr Len/2nd Weight Sex Delivery Anes PTL Lv  5 CUR           4 TRM 01/09/07 [redacted]w[redacted]d  7 lb 2 oz (3.232 kg) M CS Spinal  Y  3 TRM 05/30/03 [redacted]w[redacted]d  6 lb 8.5 oz (2.963 kg) F CS Spinal N Y  2 TRM 11/11/01 [redacted]w[redacted]d  6 lb 9 oz (2.977 kg) M CS Spinal N Y  1 TAB              Past Medical History  Diagnosis Date  . Asthma   . Ovarian cyst   . Gonorrhea   . Diabetes mellitus   . Chlamydia   . STMHDQQI(297.9)    Past Surgical History  Procedure Laterality Date  . Cesarean section      x3  . Therapeutic abortion     Family History  Problem Relation Age of Onset  . Thyroid disease Mother   . Diabetes Father   . Hypertension Father   . Ovarian cancer Maternal Grandmother   . Breast cancer Maternal Grandmother   . Cancer Maternal Grandmother   . Cancer Maternal Grandfather   . Lung cancer Maternal Grandfather   . Asthma Son      Exam    Uterus:    8 wk size  Pelvic Exam:    Perineum: Normal Perineum   Vulva: Bartholin's, Urethra, Skene's normal   Vagina:  normal mucosa, normal discharge   Cervix: no lesions   Adnexa: normal adnexa   Bony Pelvis: average  System: Breast:  normal appearance, no masses or tenderness   Skin: normal coloration and turgor, no rashes    Neurologic: normal   Extremities: normal strength, tone, and muscle mass   HEENT sclera clear,  anicteric   Mouth/Teeth mucous membranes moist, pharynx normal without lesions and dental hygiene good   Neck supple   Cardiovascular: regular rate and rhythm, no murmurs or gallops   Respiratory:  appears well, vitals normal, no respiratory distress, acyanotic, normal RR, ear and throat exam is normal, neck free of mass or lymphadenopathy, chest clear, no wheezing, crepitations, rhonchi, normal symmetric air entry   Abdomen: soft, non-tender; bowel sounds normal; no masses,  no organomegaly      Assessment:    Pregnancy: [id number] Patient Active Problem List   Diagnosis Date Noted  . Diabetes mellitus, antepartum(648.03) 04/26/2014  . Supervision of  high-risk pregnancy 04/26/2014  . RUQ pain 07/28/2012  . 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 Glucose intolerance (impaired glucose tolerance) 10/17/2011  . Neuropathic pain 10/17/2011        Plan:     Initial labs drawn. Prenatal vitamins. Problem list reviewed and updated. Genetic Screening discussed First Screen: ordered.  Ultrasound discussed; fetal survey: ordered.  Follow up in 1 weeks. Insulin start today given BS and need for continued medication adjustment. Baseline labs Optho referral Baby ASA Will need fetal ECHO   Carmen Watts 04/26/2014

## 2014-04-27 ENCOUNTER — Encounter: Payer: Self-pay | Admitting: Family Medicine

## 2014-04-27 LAB — OBSTETRIC PANEL
ANTIBODY SCREEN: NEGATIVE
Basophils Absolute: 0 10*3/uL (ref 0.0–0.1)
Basophils Relative: 0 % (ref 0–1)
EOS ABS: 0.2 10*3/uL (ref 0.0–0.7)
EOS PCT: 2 % (ref 0–5)
HEMATOCRIT: 35.7 % — AB (ref 36.0–46.0)
Hemoglobin: 12.3 g/dL (ref 12.0–15.0)
Hepatitis B Surface Ag: NEGATIVE
LYMPHS ABS: 2.7 10*3/uL (ref 0.7–4.0)
LYMPHS PCT: 26 % (ref 12–46)
MCH: 27.7 pg (ref 26.0–34.0)
MCHC: 34.5 g/dL (ref 30.0–36.0)
MCV: 80.4 fL (ref 78.0–100.0)
MONO ABS: 0.7 10*3/uL (ref 0.1–1.0)
MONOS PCT: 7 % (ref 3–12)
Neutro Abs: 6.7 10*3/uL (ref 1.7–7.7)
Neutrophils Relative %: 65 % (ref 43–77)
Platelets: 367 10*3/uL (ref 150–400)
RBC: 4.44 MIL/uL (ref 3.87–5.11)
RDW: 14.6 % (ref 11.5–15.5)
RH TYPE: POSITIVE
RUBELLA: 8.73 {index} — AB (ref <0.90)
WBC: 10.3 10*3/uL (ref 4.0–10.5)

## 2014-04-27 LAB — CYTOLOGY - PAP

## 2014-04-27 LAB — TSH: TSH: 1.441 u[IU]/mL (ref 0.350–4.500)

## 2014-04-27 NOTE — Progress Notes (Signed)
04/26/14 - DIABETES Insulin Instruction  The following learning objectives were met by the patient during this visit:  Insulin Action of NPH & Novolog insulins  Reviewed syringe & vial of NPH and pen for Novolog including # units per dosing,   Hygiene and storage  Drawing up single doses using vials  Rotation of Sites  Hypoglycemia- symptoms, causes , treatment choices  Hypoglycemia, causes, symptoms and treatment                                         Patient to start on insulin as Rx'd by MD Patient will be seen for follow-up as needed.

## 2014-04-28 LAB — PRESCRIPTION MONITORING PROFILE (19 PANEL)
AMPHETAMINE/METH: NEGATIVE ng/mL
BARBITURATE SCREEN, URINE: NEGATIVE ng/mL
BENZODIAZEPINE SCREEN, URINE: NEGATIVE ng/mL
BUPRENORPHINE, URINE: NEGATIVE ng/mL
CANNABINOID SCRN UR: NEGATIVE ng/mL
COCAINE METABOLITES: NEGATIVE ng/mL
Carisoprodol, Urine: NEGATIVE ng/mL
Creatinine, Urine: 496.19 mg/dL (ref >20.0)
FENTANYL URINE: NEGATIVE ng/mL
MDMA URINE: NEGATIVE ng/mL
Meperidine, Ur: NEGATIVE ng/mL
Methadone Screen, Urine: NEGATIVE ng/mL
Methaqualone: NEGATIVE ng/mL
Nitrites, Initial: NEGATIVE ug/mL
OPIATE SCREEN, URINE: NEGATIVE ng/mL
Oxycodone Screen, Ur: NEGATIVE ng/mL
PHENCYCLIDINE, UR: NEGATIVE ng/mL
Propoxyphene: NEGATIVE ng/mL
Tapentadol, urine: NEGATIVE ng/mL
Tramadol Scrn, Ur: NEGATIVE ng/mL
ZOLPIDEM, URINE: NEGATIVE ng/mL
pH, Initial: 6 pH (ref 4.5–8.9)

## 2014-04-28 LAB — HEMOGLOBINOPATHY EVALUATION
HGB A: 96.9 % (ref 96.8–97.8)
HGB F QUANT: 0 % (ref 0.0–2.0)
HGB S QUANTITAION: 0 %
Hemoglobin Other: 0 %
Hgb A2 Quant: 3.1 % (ref 2.2–3.2)

## 2014-04-28 NOTE — Patient Instructions (Signed)
Return to clinic for any obstetric concerns or go to [facility] for evaluation  

## 2014-04-29 LAB — CULTURE, OB URINE

## 2014-05-03 ENCOUNTER — Ambulatory Visit (INDEPENDENT_AMBULATORY_CARE_PROVIDER_SITE_OTHER): Payer: Medicaid Other | Admitting: Obstetrics & Gynecology

## 2014-05-03 ENCOUNTER — Encounter: Payer: Self-pay | Admitting: Obstetrics & Gynecology

## 2014-05-03 ENCOUNTER — Telehealth: Payer: Self-pay

## 2014-05-03 VITALS — BP 111/75 | HR 81 | Temp 97.1°F | Wt 266.4 lb

## 2014-05-03 DIAGNOSIS — O24919 Unspecified diabetes mellitus in pregnancy, unspecified trimester: Secondary | ICD-10-CM

## 2014-05-03 DIAGNOSIS — O0990 Supervision of high risk pregnancy, unspecified, unspecified trimester: Secondary | ICD-10-CM

## 2014-05-03 LAB — POCT URINALYSIS DIP (DEVICE)
Bilirubin Urine: NEGATIVE
Glucose, UA: NEGATIVE mg/dL
Nitrite: NEGATIVE
Protein, ur: NEGATIVE mg/dL
Specific Gravity, Urine: 1.025 (ref 1.005–1.030)
Urobilinogen, UA: 0.2 mg/dL (ref 0.0–1.0)
pH: 6 (ref 5.0–8.0)

## 2014-05-03 LAB — COMPREHENSIVE METABOLIC PANEL WITH GFR
ALT: 9 U/L (ref 0–35)
AST: 9 U/L (ref 0–37)
Albumin: 3.7 g/dL (ref 3.5–5.2)
Alkaline Phosphatase: 53 U/L (ref 39–117)
BUN: 5 mg/dL — ABNORMAL LOW (ref 6–23)
CO2: 23 meq/L (ref 19–32)
Calcium: 9.8 mg/dL (ref 8.4–10.5)
Chloride: 100 meq/L (ref 96–112)
Creat: 0.54 mg/dL (ref 0.50–1.10)
Glucose, Bld: 135 mg/dL — ABNORMAL HIGH (ref 70–99)
Potassium: 3.8 meq/L (ref 3.5–5.3)
Sodium: 134 meq/L — ABNORMAL LOW (ref 135–145)
Total Bilirubin: 0.3 mg/dL (ref 0.2–1.2)
Total Protein: 6.7 g/dL (ref 6.0–8.3)

## 2014-05-03 MED ORDER — INSULIN PEN NEEDLE 32G X 4 MM MISC: 1.0000 | Freq: Two times a day (BID) | Status: DC

## 2014-05-03 NOTE — Progress Notes (Signed)
Needs to start her insulin therapy. Still inadequate control

## 2014-05-03 NOTE — Telephone Encounter (Signed)
Received call from [facility] on 04/30/14 at 1430 regarding patient's prescriptions that had been sent in -- stated patient also needed order for BD pen needles for novolog flex pen. Called pharmacist who stated the best order would be BD pen needle nano and he would ensure the correct needle would be given to patient. Order placed. No further needs.

## 2014-05-03 NOTE — Patient Instructions (Signed)
Sterilization Information, Female Female sterilization is a procedure to permanently prevent pregnancy. There are different ways to perform sterilization, but all either block or close the fallopian tubes so that your eggs cannot reach your uterus. If your egg cannot reach your uterus, sperm cannot fertilize the egg, and you cannot get pregnant.  Sterilization is performed by a surgical procedure. Sometimes these procedures are performed in a hospital while a patient is asleep. Sometimes they can be done in a clinic setting with the patient awake. The fallopian tubes can be surgically cut, tied, or sealed through a procedure called tubal ligation. The fallopian tubes can also be closed with clips or rings. Sterilization can also be done by placing a tiny coil into each fallopian tube, which causes scar tissue to grow inside the tube. The scar tissue then blocks the tubes.  Discuss sterilization with your caregiver to answer any concerns you or your partner may have. You may want to ask what type of sterilization your caregiver performs. Some caregivers may not perform all the various options. Sterilization is permanent and should only be done if you are sure you do not want children or do not want any more children. Having a sterilization reversed may not be successful.  STERILIZATION PROCEDURES  Laparoscopic sterilization. This is a surgical method performed at a time other than right after childbirth. Two incisions are made in the lower abdomen. A thin, lighted tube (laparoscope) is inserted into one of the incisions and is used to perform the procedure. The fallopian tubes are closed with a ring or a clip. An instrument that uses heat could be used to seal the tubes closed (electrocautery).   Mini-laparotomy. This is a surgical method done 1 or 2 days after giving birth. Typically, a small incision is made just below the belly button (umbilicus) and the fallopian tubes are exposed. The tubes can then be  sealed, tied, or cut.   Hysteroscopic sterilization. This is performed at a time other than right after childbirth. A tiny, spring-like coil is inserted through the cervix and uterus and placed into the fallopian tubes. The coil causes scaring and blocks the tubes. Other forms of contraception should be used for 3 months after the procedure to allow the scar tissue to form completely. Additionally, it is required hysterosalpingography be done 3 months later to ensure that the procedure was successful. Hysterosalpingography is a procedure that uses X-rays to look at your uterus and fallopian tubes after a material to make them show up better has been inserted. IS STERILIZATION SAFE? Sterilization is considered safe with very rare complications. Risks depend on the type of procedure you have. As with any surgical procedure, there are risks. Some risks of sterilization by any means include:   Bleeding.  Infection.  Reaction to anesthesia medicine.  Injury to surrounding organs. Risks specific to having hysteroscopic coils placed include:  The coils may not be placed correctly the first time.   The coils may move out of place.   The tubes may not get completely blocked after 3 months.   Injury to surrounding organs when placing the coil.  HOW EFFECTIVE IS FEMALE STERILIZATION? Sterilization is nearly 100% effective, but it can fail. Depending on the type of sterilization, the rate of failure can be as high as 3%. After hysteroscopic sterilization with placement of fallopian tube coils, you will need back-up birth control for 3 months after the procedure. Sterilization is effective for a lifetime.  BENEFITS OF STERILIZATION  It does   not affect your hormones, and therefore will not affect your menstrual periods, sexual desire, or performance.   It is effective for a lifetime.   It is safe.   You do not need to worry about getting pregnant. Keep in mind that if you had the  hysteroscopic placement procedure, you must wait 3 months after the procedure (or until your caregiver confirms) before pregnancy is not considered possible.   There are no side effects unlike other types of birth control (contraception).  DRAWBACKS OF STERILIZATION  You must be sure you do not want children or any more children. The procedure is permanent.   It does not provide protection against sexually transmitted infections (STIs).   The tubes can grow back together. If this happens, there is a risk of pregnancy. There is also an increased risk (50%) of pregnancy being an ectopic pregnancy. This is a pregnancy that happens outside of the uterus. Document Released: 04/23/2008 Document Revised: 05/06/2012 Document Reviewed: 02/21/2012 Exit[employer] Patient Information 2014 [employer].  

## 2014-05-06 LAB — CREATININE CLEARANCE, URINE, 24 HOUR
CREATININE, URINE: 122.4 mg/dL
CREATININE: 0.54 mg/dL (ref 0.50–1.10)
Creatinine Clearance: 220 mL/min — ABNORMAL HIGH (ref 75–115)
Creatinine, 24H Ur: 1714 mg/d (ref 700–1800)

## 2014-05-06 LAB — PROTEIN, URINE, 24 HOUR
Protein, 24H Urine: 56 mg/d (ref 50–100)
Protein, Urine: 4 mg/dL

## 2014-05-07 ENCOUNTER — Ambulatory Visit (HOSPITAL_COMMUNITY)
Admission: RE | Admit: 2014-05-07 | Discharge: 2014-05-07 | Disposition: A | Discharge status: Home / Self Care | Payer: Medicaid Other | Source: Ambulatory Visit | Attending: Family Medicine | Admitting: Family Medicine

## 2014-05-07 ENCOUNTER — Other Ambulatory Visit: Payer: Self-pay | Admitting: Family Medicine

## 2014-05-07 ENCOUNTER — Ambulatory Visit (HOSPITAL_COMMUNITY): Admission: RE | Admit: 2014-05-07 | Payer: Medicaid Other | Source: Ambulatory Visit

## 2014-05-07 VITALS — BP 139/78 | HR 95 | Wt 265.0 lb

## 2014-05-07 DIAGNOSIS — O24919 Unspecified diabetes mellitus in pregnancy, unspecified trimester: Secondary | ICD-10-CM | POA: Insufficient documentation

## 2014-05-07 DIAGNOSIS — Z3689 Encounter for other specified antenatal screening: Secondary | ICD-10-CM | POA: Insufficient documentation

## 2014-05-10 ENCOUNTER — Ambulatory Visit (INDEPENDENT_AMBULATORY_CARE_PROVIDER_SITE_OTHER): Payer: Medicaid Other | Admitting: Family Medicine

## 2014-05-10 VITALS — BP 136/80 | HR 86 | Wt 264.0 lb

## 2014-05-10 DIAGNOSIS — O0990 Supervision of high risk pregnancy, unspecified, unspecified trimester: Secondary | ICD-10-CM

## 2014-05-10 DIAGNOSIS — O24919 Unspecified diabetes mellitus in pregnancy, unspecified trimester: Secondary | ICD-10-CM

## 2014-05-10 DIAGNOSIS — O34219 Maternal care for unspecified type scar from previous cesarean delivery: Secondary | ICD-10-CM

## 2014-05-10 LAB — POCT URINALYSIS DIP (DEVICE)
Bilirubin Urine: NEGATIVE
Glucose, UA: NEGATIVE mg/dL
Hgb urine dipstick: NEGATIVE
LEUKOCYTES UA: NEGATIVE
Nitrite: NEGATIVE
PROTEIN: NEGATIVE mg/dL
Specific Gravity, Urine: 1.02 (ref 1.005–1.030)
UROBILINOGEN UA: 0.2 mg/dL (ref 0.0–1.0)
pH: 6 (ref 5.0–8.0)

## 2014-05-10 NOTE — Progress Notes (Signed)
S: 33 yo [id number] @ [redacted]w[redacted]d here for ROBV. Started insulin 1 week ago for class B DM  NPH - 15U BID Aspart- 17U TID  Fasting: 188/152/153/146/144/169/128 Meals: 106 x1.  All others out of range 126-225 >half higher than 180   O: see flowsheet  A/P - sugars still out of control. Increase to 20U BID NPH and keep mealtime coverage the same - f/u in 1 week - still needs fetal echo scheduled in the future but too early at this point.

## 2014-05-10 NOTE — Patient Instructions (Signed)
Humulin - increase to 20U twice a day Continue your Novolog at 17U with each meal

## 2014-05-17 ENCOUNTER — Ambulatory Visit (INDEPENDENT_AMBULATORY_CARE_PROVIDER_SITE_OTHER): Payer: Medicaid Other | Admitting: Obstetrics & Gynecology

## 2014-05-17 VITALS — BP 131/75 | HR 96 | Temp 97.9°F | Wt 264.5 lb

## 2014-05-17 DIAGNOSIS — O24919 Unspecified diabetes mellitus in pregnancy, unspecified trimester: Secondary | ICD-10-CM

## 2014-05-17 DIAGNOSIS — O0990 Supervision of high risk pregnancy, unspecified, unspecified trimester: Secondary | ICD-10-CM

## 2014-05-17 DIAGNOSIS — O3680X0 Pregnancy with inconclusive fetal viability, not applicable or unspecified: Secondary | ICD-10-CM

## 2014-05-17 LAB — POCT URINALYSIS DIP (DEVICE)
Bilirubin Urine: NEGATIVE
GLUCOSE, UA: NEGATIVE mg/dL
Hgb urine dipstick: NEGATIVE
Ketones, ur: NEGATIVE mg/dL
Leukocytes, UA: NEGATIVE
NITRITE: NEGATIVE
Protein, ur: NEGATIVE mg/dL
Specific Gravity, Urine: 1.015 (ref 1.005–1.030)
UROBILINOGEN UA: 0.2 mg/dL (ref 0.0–1.0)
pH: 6 (ref 5.0–8.0)

## 2014-05-17 LAB — US OB COMP LESS 14 WKS

## 2014-05-17 MED ORDER — INSULIN NPH (HUMAN) (ISOPHANE) 100 UNIT/ML ~~LOC~~ SUSP: SUBCUTANEOUS | Status: DC

## 2014-05-17 MED ORDER — INSULIN ASPART 100 UNIT/ML FLEXPEN
20.0000 [IU] | PEN_INJECTOR | Freq: Three times a day (TID) | SUBCUTANEOUS | Status: DC
Start: 2014-05-17 — End: 2015-06-13

## 2014-05-17 NOTE — Progress Notes (Signed)
Fastings--120s-130s.  Pp breakfast and lunch 140; pp dinner approx 150 (Pt forgot CBG log).  Sometimes feel low before lunch (pt instructed to take CBG when she feels that way). Increase nighttime NPH to 24 (was at 20 units). Increase Novalog before meals to 20 before each meal (was at 17 units). Needs bedside US for FH today.  Optho and 24 hour urine are WNL. Pt has genetic testing on 05/28/14

## 2014-05-24 ENCOUNTER — Encounter: Payer: Medicaid Other | Admitting: Obstetrics & Gynecology

## 2014-05-28 ENCOUNTER — Other Ambulatory Visit (HOSPITAL_COMMUNITY): Payer: Medicaid Other

## 2014-05-28 ENCOUNTER — Ambulatory Visit (HOSPITAL_COMMUNITY): Payer: Medicaid Other

## 2014-07-15 DIAGNOSIS — Z8742 Personal history of other diseases of the female genital tract: Secondary | ICD-10-CM | POA: Diagnosis not present

## 2014-07-15 DIAGNOSIS — J029 Acute pharyngitis, unspecified: Secondary | ICD-10-CM | POA: Diagnosis present

## 2014-07-15 DIAGNOSIS — E119 Type 2 diabetes mellitus without complications: Secondary | ICD-10-CM | POA: Insufficient documentation

## 2014-07-15 DIAGNOSIS — Z794 Long term (current) use of insulin: Secondary | ICD-10-CM | POA: Diagnosis not present

## 2014-07-15 DIAGNOSIS — J020 Streptococcal pharyngitis: Secondary | ICD-10-CM | POA: Insufficient documentation

## 2014-07-15 DIAGNOSIS — J45909 Unspecified asthma, uncomplicated: Secondary | ICD-10-CM | POA: Insufficient documentation

## 2014-07-15 NOTE — ED Notes (Signed)
Presents with sore throat began this AM, throat red, tonsils enlarged, no exudate noted. painis worse with swallowing. Handling secretions, airway intact.

## 2014-07-16 ENCOUNTER — Encounter (HOSPITAL_COMMUNITY): Payer: Self-pay | Admitting: Emergency Medicine

## 2014-07-16 ENCOUNTER — Emergency Department (HOSPITAL_COMMUNITY)
Admission: EM | Admit: 2014-07-16 | Discharge: 2014-07-16 | Disposition: A | Discharge status: Home / Self Care | Payer: Medicaid Other | Attending: Emergency Medicine | Admitting: Emergency Medicine

## 2014-07-16 DIAGNOSIS — J020 Streptococcal pharyngitis: Secondary | ICD-10-CM

## 2014-07-16 LAB — RAPID STREP SCREEN (MED CTR MEBANE ONLY): STREPTOCOCCUS, GROUP A SCREEN (DIRECT): POSITIVE — AB

## 2014-07-16 MED ORDER — HYDROCODONE-ACETAMINOPHEN 7.5-325 MG/15ML PO SOLN: 10.0000 mL | ORAL | Status: DC | PRN

## 2014-07-16 MED ORDER — PENICILLIN G BENZATHINE 1200000 UNIT/2ML IM SUSP
1.2000 10*6.[IU] | Freq: Once | INTRAMUSCULAR | Status: AC
  Administered 2014-07-16: 1.2 10*6.[IU] via INTRAMUSCULAR
  Filled 2014-07-16: qty 2

## 2014-07-16 MED ORDER — HYDROCODONE-ACETAMINOPHEN 7.5-325 MG/15ML PO SOLN
10.0000 mL | Freq: Once | ORAL | Status: AC
  Administered 2014-07-16: 10 mL via ORAL
  Filled 2014-07-16: qty 15

## 2014-07-16 MED ORDER — IBUPROFEN 800 MG PO TABS: 800.0000 mg | ORAL_TABLET | Freq: Three times a day (TID) | ORAL | Status: DC | PRN

## 2014-07-16 MED ORDER — PENICILLIN G BENZATHINE & PROC 1200000 UNIT/2ML IM SUSP: 1.2000 10*6.[IU] | Freq: Once | INTRAMUSCULAR | Status: DC

## 2014-07-16 NOTE — ED Provider Notes (Signed)
Medical screening examination/treatment/procedure(s) were performed by non-physician practitioner and as supervising physician I was immediately available for consultation/collaboration.   EKG Interpretation None        Everlene Balls, MD 07/16/14 [phone]

## 2014-07-16 NOTE — ED Provider Notes (Signed)
CSN: [id number]     Arrival date & time 07/15/14  2328 History   First MD Initiated Contact with Patient 07/16/14 0004     Chief Complaint  Patient presents with  . Sore Throat     (Consider location/radiation/quality/duration/timing/severity/associated sxs/prior Treatment) HPI  Patient presents with sore throat and enlarged tonsils x 1 day.  Pain is constant, exacerbated with swallowing.  Denies known fevers, cough, SOB, nasal congestion or rhinorrhea.  Has taken nothing for her symptoms.  Son was sick with sore throat earlier this week, this has resolved.   Past Medical History  Diagnosis Date  . Asthma   . Ovarian cyst   . Gonorrhea   . Diabetes mellitus   . Chlamydia   . QQPYPPJK(932.6)    Past Surgical History  Procedure Laterality Date  . Cesarean section      x3  . Therapeutic abortion     Family History  Problem Relation Age of Onset  . Thyroid disease Mother   . Diabetes Father   . Hypertension Father   . Ovarian cancer Maternal Grandmother   . Breast cancer Maternal Grandmother   . Cancer Maternal Grandmother   . Cancer Maternal Grandfather   . Lung cancer Maternal Grandfather   . Asthma Son    History  Substance Use Topics  . Smoking status: Never Smoker   . Smokeless tobacco: Never Used  . Alcohol Use: No     Comment: Socially before pregnancy   OB History   Grav Para Term Preterm Abortions TAB SAB Ect Mult Living   5 3 3  0 1 1 0 0 0 3     Review of Systems  Constitutional: Negative for fever and chills.  HENT: Positive for sore throat. Negative for drooling, rhinorrhea, sinus pressure, sneezing and trouble swallowing.   Respiratory: Negative for cough and shortness of breath.   Gastrointestinal: Negative for nausea, vomiting and abdominal pain.  Skin: Negative for color change.  Allergic/Immunologic: Negative for immunocompromised state.  Psychiatric/Behavioral: Negative for confusion.      Allergies  Other  Home Medications   Prior  to Admission medications   Medication Sig Start Date End Date Taking? Authorizing Provider  albuterol (PROVENTIL HFA;VENTOLIN HFA) 108 (90 BASE) MCG/ACT inhaler Inhale 2 puffs into the lungs every 6 (six) hours as needed. For asthma syptoms    Yes Historical Provider, MD  insulin aspart (NOVOLOG FLEXPEN) 100 UNIT/ML FlexPen Inject 20 Units into the skin 3 (three) times daily with meals. 05/17/14  Yes Guss Bunde, MD  insulin NPH Human (HUMULIN N,NOVOLIN N) 100 UNIT/ML injection Take 20 units in the morning and 24 units before bedtime. 05/17/14  Yes Guss Bunde, MD  ACCU-CHEK FASTCLIX LANCETS MISC Inject 1 each into the skin 4 (four) times daily. 648.83 for testing 4 times daily 04/19/14   Mora Bellman, MD  glucose blood (ACCU-CHEK ACTIVE STRIPS) test strip Use as instructed 04/19/14   Mora Bellman, MD  Insulin Pen Needle (BD PEN NEEDLE NANO U/F) 32G X 4 MM MISC 1 applicator by Does not apply route 2 (two) times daily before lunch and supper. 05/03/14   [name], MD  Insulin Syringe-Needle U-100 (INSULIN SYRINGE 1CC/31GX5/16") 31G X 5/16" 1 ML MISC 1 Syringe by Does not apply route 2 (two) times daily. 04/26/14   [name], MD   BP 128/81  Pulse 95  Temp(Src) 99.5 F (37.5 C) (Oral)  Resp 18  SpO2 100%  LMP 02/08/2014  Breastfeeding? Unknown  Physical Exam  Nursing note and vitals reviewed. Constitutional: She appears well-developed and well-nourished. No distress.  HENT:  Head: Normocephalic and atraumatic.  Mouth/Throat: Uvula is midline. Mucous membranes are not dry. No uvula swelling. Posterior oropharyngeal edema and posterior oropharyngeal erythema present. No oropharyngeal exudate or tonsillar abscesses.  Eyes: Conjunctivae are normal.  Neck: Neck supple.  Cardiovascular: Normal rate and regular rhythm.   Pulmonary/Chest: Effort normal and breath sounds normal. No respiratory distress. She has no wheezes. She has no rales.  Neurological: She is alert.  Skin: She is not  diaphoretic.    ED Course  Procedures (including critical care time) Labs Review Labs Reviewed  RAPID STREP SCREEN - Abnormal; Notable for the following:    Streptococcus, Group A Screen (Direct) POSITIVE (*)    All other components within normal limits    Imaging Review No results found.   EKG Interpretation None      MDM   Final diagnoses:  Strep throat   Afebrile, nontoxic patient with sore throat x 1 day.  Rapid strep positive. Doubt peritonsillar abscess or deep space infection. Lortab elixir and bicillin given.   D/C home with lortab elixir.   Discussed result, findings, treatment, and follow up  with patient.  Pt given return precautions.  Pt verbalizes understanding and agrees with plan.        [place], PA-C 07/16/14 [phone]

## 2014-07-16 NOTE — Discharge Instructions (Signed)
Read the information below.  Use the prescribed medication as directed.  Please discuss all new medications with your pharmacist.  Do not take additional tylenol while taking the prescribed pain medication to avoid overdose.  You may return to the Emergency Department at any time for worsening condition or any new symptoms that concern you.  If you develop high fevers, difficulty swallowing or breathing, or you are unable to tolerate fluids by mouth, return to the ER immediately for a recheck.    ° ° °Strep Throat °Strep throat is an infection of the throat. It is caused by a germ. Strep throat spreads from person to person by coughing, sneezing, or close contact. °HOME CARE °· Rinse your mouth (gargle) with warm salt water (1 teaspoon salt in 1 cup of water). Do this 3 to 4 times per day or as needed for comfort. °· Family members with a sore throat or fever should see a doctor. °· Make sure everyone in your house washes their hands well. °· Do not share food, drinking cups, or personal items. °· Eat soft foods until your sore throat gets better. °· Drink enough water and fluids to keep your pee (urine) clear or pale yellow. °· Rest. °· Stay home from school, daycare, or work until you have taken medicine for 24 hours. °· Only take medicine as told by your doctor. °· Take your medicine as told. Finish it even if you start to feel better. °GET HELP RIGHT AWAY IF:  °· You have new problems, such as throwing up (vomiting) or bad headaches. °· You have a stiff or painful neck, chest pain, trouble breathing, or trouble swallowing. °· You have very bad throat pain, drooling, or changes in your voice. °· Your neck puffs up (swells) or gets red and tender. °· You have a fever. °· You are very tired, your mouth is dry, or you are peeing less than normal. °· You cannot wake up completely. °· You get a rash, cough, or earache. °· You have green, yellow-brown, or bloody spit. °· Your pain does not get better with  medicine. °MAKE SURE YOU:  °· Understand these instructions. °· Will watch your condition. °· Will get help right away if you are not doing well or get worse. °Document Released: 04/23/2008 Document Revised: 01/28/2012 Document Reviewed: 01/04/2011 °Exit[employer]® Patient Information ©2015 [employer]. This information is not intended to replace advice given to you by your health care provider. Make sure you discuss any questions you have with your health care provider. ° °

## 2014-09-20 ENCOUNTER — Encounter (HOSPITAL_COMMUNITY): Payer: Self-pay | Admitting: Emergency Medicine

## 2014-10-22 ENCOUNTER — Encounter: Payer: Self-pay | Admitting: Obstetrics & Gynecology

## 2015-04-06 ENCOUNTER — Inpatient Hospital Stay (HOSPITAL_COMMUNITY): Payer: Medicaid Other

## 2015-04-06 ENCOUNTER — Inpatient Hospital Stay (HOSPITAL_COMMUNITY)
Admission: AD | Admit: 2015-04-06 | Discharge: 2015-04-06 | Disposition: A | Discharge status: Home / Self Care | Payer: Self-pay | Source: Ambulatory Visit | Attending: Obstetrics & Gynecology | Admitting: Obstetrics & Gynecology

## 2015-04-06 ENCOUNTER — Encounter (HOSPITAL_COMMUNITY): Payer: Self-pay | Admitting: *Deleted

## 2015-04-06 DIAGNOSIS — R1084 Generalized abdominal pain: Secondary | ICD-10-CM

## 2015-04-06 DIAGNOSIS — O24311 Unspecified pre-existing diabetes mellitus in pregnancy, first trimester: Secondary | ICD-10-CM | POA: Insufficient documentation

## 2015-04-06 DIAGNOSIS — O24911 Unspecified diabetes mellitus in pregnancy, first trimester: Secondary | ICD-10-CM

## 2015-04-06 DIAGNOSIS — O26899 Other specified pregnancy related conditions, unspecified trimester: Secondary | ICD-10-CM

## 2015-04-06 DIAGNOSIS — E119 Type 2 diabetes mellitus without complications: Secondary | ICD-10-CM | POA: Insufficient documentation

## 2015-04-06 DIAGNOSIS — Z794 Long term (current) use of insulin: Secondary | ICD-10-CM | POA: Insufficient documentation

## 2015-04-06 DIAGNOSIS — R109 Unspecified abdominal pain: Secondary | ICD-10-CM | POA: Insufficient documentation

## 2015-04-06 DIAGNOSIS — Z3A13 13 weeks gestation of pregnancy: Secondary | ICD-10-CM | POA: Insufficient documentation

## 2015-04-06 LAB — POCT PREGNANCY, URINE: Preg Test, Ur: POSITIVE — AB

## 2015-04-06 LAB — WET PREP, GENITAL
TRICH WET PREP: NONE SEEN
Yeast Wet Prep HPF POC: NONE SEEN

## 2015-04-06 LAB — CBC WITH DIFFERENTIAL/PLATELET
Basophils Absolute: 0 10*3/uL (ref 0.0–0.1)
Basophils Relative: 0 % (ref 0–1)
EOS PCT: 3 % (ref 0–5)
Eosinophils Absolute: 0.2 10*3/uL (ref 0.0–0.7)
HEMATOCRIT: 37.7 % (ref 36.0–46.0)
Hemoglobin: 12.7 g/dL (ref 12.0–15.0)
LYMPHS ABS: 3.4 10*3/uL (ref 0.7–4.0)
Lymphocytes Relative: 36 % (ref 12–46)
MCH: 27.1 pg (ref 26.0–34.0)
MCHC: 33.7 g/dL (ref 30.0–36.0)
MCV: 80.6 fL (ref 78.0–100.0)
MONO ABS: 0.6 10*3/uL (ref 0.1–1.0)
MONOS PCT: 6 % (ref 3–12)
Neutro Abs: 5.2 10*3/uL (ref 1.7–7.7)
Neutrophils Relative %: 55 % (ref 43–77)
Platelets: 307 10*3/uL (ref 150–400)
RBC: 4.68 MIL/uL (ref 3.87–5.11)
RDW: 14.8 % (ref 11.5–15.5)
WBC: 9.4 10*3/uL (ref 4.0–10.5)

## 2015-04-06 LAB — ABO/RH: ABO/RH(D): B POS

## 2015-04-06 LAB — URINALYSIS, ROUTINE W REFLEX MICROSCOPIC
BILIRUBIN URINE: NEGATIVE
Glucose, UA: NEGATIVE mg/dL
HGB URINE DIPSTICK: NEGATIVE
KETONES UR: NEGATIVE mg/dL
Leukocytes, UA: NEGATIVE
NITRITE: NEGATIVE
Protein, ur: NEGATIVE mg/dL
SPECIFIC GRAVITY, URINE: 1.025 (ref 1.005–1.030)
UROBILINOGEN UA: 1 mg/dL (ref 0.0–1.0)
pH: 6.5 (ref 5.0–8.0)

## 2015-04-06 LAB — HCG, QUANTITATIVE, PREGNANCY: hCG, Beta Chain, Quant, S: 7056 m[IU]/mL — ABNORMAL HIGH (ref <5)

## 2015-04-06 NOTE — MAU Note (Signed)
For the past wk has been having pain in LLQ, just can't take it anymore.  Kind of crampy, pins and needle like

## 2015-04-06 NOTE — MAU Provider Note (Signed)
History     CSN: [id number]  Arrival date and time: 04/06/15 1135   First Provider Initiated Contact with Patient 04/06/15 1344      Chief Complaint  Patient presents with  . Abdominal Pain   HPI Jaira Canady XBLTJ 34 y.o. [id number] @[redacted]w[redacted]d  based on irregular/uncertain LMP presents to MAU complaining of abdominal pain x 2 weeks. It comes and goes but with numbing constant pain in between episodes of more severe pain.   It is 8/10 now.  It is a dull, tight crampy pain.  Located left lower abdomen.  No aggravating factors, improved with laying on left side.  Denies fever, vomiting, vag bleeding, discharge, dysuria.   OB History    Gravida Para Term Preterm AB TAB SAB Ectopic Multiple Living   6 3 3  0 2 2 0 0 0 3      Past Medical History  Diagnosis Date  . Asthma   . Ovarian cyst   . Gonorrhea   . Diabetes mellitus   . Chlamydia   . QTMAUQJF(354.5)     Past Surgical History  Procedure Laterality Date  . Cesarean section      x3  . Therapeutic abortion      Family History  Problem Relation Age of Onset  . Thyroid disease Mother   . Diabetes Father   . Hypertension Father   . Ovarian cancer Maternal Grandmother   . Breast cancer Maternal Grandmother   . Cancer Maternal Grandmother   . Cancer Maternal Grandfather   . Lung cancer Maternal Grandfather   . Asthma Son     History  Substance Use Topics  . Smoking status: Never Smoker   . Smokeless tobacco: Never Used  . Alcohol Use: No     Comment: Socially before pregnancy    Allergies:  Allergies  Allergen Reactions  . Other Shortness Of Breath and Itching    Fresh fruit    Prescriptions prior to admission  Medication Sig Dispense Refill Last Dose  . ACCU-CHEK FASTCLIX LANCETS MISC Inject 1 each into the skin 4 (four) times daily. 648.83 for testing 4 times daily 102 each 12 Taking  . albuterol (PROVENTIL HFA;VENTOLIN HFA) 108 (90 BASE) MCG/ACT inhaler Inhale 2 puffs into the lungs every 6 (six) hours as needed.  For asthma syptoms    Past Month at Unknown time  . glucose blood (ACCU-CHEK ACTIVE STRIPS) test strip Use as instructed 1 each 12 Taking  . HYDROcodone-acetaminophen (HYCET) 7.5-325 mg/15 ml solution Take 10 mLs by mouth every 4 (four) hours as needed for moderate pain or severe pain. 150 mL 0   . ibuprofen (ADVIL,MOTRIN) 800 MG tablet Take 1 tablet (800 mg total) by mouth every 8 (eight) hours as needed for mild pain or moderate pain. 15 tablet 0   . insulin aspart (NOVOLOG FLEXPEN) 100 UNIT/ML FlexPen Inject 20 Units into the skin 3 (three) times daily with meals. 15 mL 11 07/15/2014 at Unknown time  . insulin NPH Human (HUMULIN N,NOVOLIN N) 100 UNIT/ML injection Take 20 units in the morning and 24 units before bedtime. 10 mL 11 07/15/2014 at Unknown time  . Insulin Pen Needle (BD PEN NEEDLE NANO U/F) 32G X 4 MM MISC 1 applicator by Does not apply route 2 (two) times daily before lunch and supper. 30 each 11 Taking  . Insulin Syringe-Needle U-100 (INSULIN SYRINGE 1CC/31GX5/16") 31G X 5/16" 1 ML MISC 1 Syringe by Does not apply route 2 (two) times daily. 100 each 11 Taking  ROS Pertinent ROS in HPI.  All other systems are negative.   Physical Exam   Blood pressure 147/85, pulse 97, temperature 98.4 F (36.9 C), temperature source Oral, resp. rate 20, height 5\' 4"  (1.626 m), weight 275 lb (124.739 kg), last menstrual period 01/02/2015, unknown if currently breastfeeding.  Physical Exam  Constitutional: She is oriented to person, place, and time. She appears well-developed and well-nourished. No distress.  HENT:  Head: Normocephalic and atraumatic.  Eyes: EOM are normal.  Neck: Normal range of motion.  Cardiovascular: Normal rate and normal heart sounds.   Respiratory: Effort normal and breath sounds normal. No respiratory distress.  GI: Soft. Bowel sounds are normal. She exhibits no distension and no mass. There is tenderness. There is no rebound and no guarding.  Genitourinary:  Small  amt of white homogenous discharge. No CMT  No adnexal mass or tenderness  Musculoskeletal: Normal range of motion.  Neurological: She is alert and oriented to person, place, and time.  Skin: Skin is warm and dry.  Psychiatric: She has a normal mood and affect.   Results for orders placed or performed during the hospital encounter of 04/06/15 (from the past 24 hour(s))  Urinalysis, Routine w reflex microscopic     Status: None   Collection Time: 04/06/15 11:45 AM  Result Value Ref Range   Color, Urine YELLOW YELLOW   APPearance CLEAR CLEAR   Specific Gravity, Urine 1.025 1.005 - 1.030   pH 6.5 5.0 - 8.0   Glucose, UA NEGATIVE NEGATIVE mg/dL   Hgb urine dipstick NEGATIVE NEGATIVE   Bilirubin Urine NEGATIVE NEGATIVE   Ketones, ur NEGATIVE NEGATIVE mg/dL   Protein, ur NEGATIVE NEGATIVE mg/dL   Urobilinogen, UA 1.0 0.0 - 1.0 mg/dL   Nitrite NEGATIVE NEGATIVE   Leukocytes, UA NEGATIVE NEGATIVE  Pregnancy, urine POC     Status: Abnormal   Collection Time: 04/06/15 11:51 AM  Result Value Ref Range   Preg Test, Ur POSITIVE (A) NEGATIVE  Wet prep, genital     Status: Abnormal   Collection Time: 04/06/15  2:28 PM  Result Value Ref Range   Yeast Wet Prep HPF POC NONE SEEN NONE SEEN   Trich, Wet Prep NONE SEEN NONE SEEN   Clue Cells Wet Prep HPF POC FEW (A) NONE SEEN   WBC, Wet Prep HPF POC FEW (A) NONE SEEN  CBC with Differential/Platelet     Status: None   Collection Time: 04/06/15  2:53 PM  Result Value Ref Range   WBC 9.4 4.0 - 10.5 K/uL   RBC 4.68 3.87 - 5.11 MIL/uL   Hemoglobin 12.7 12.0 - 15.0 g/dL   HCT 37.7 36.0 - 46.0 %   MCV 80.6 78.0 - 100.0 fL   MCH 27.1 26.0 - 34.0 pg   MCHC 33.7 30.0 - 36.0 g/dL   RDW 14.8 11.5 - 15.5 %   Platelets 307 150 - 400 K/uL   Neutrophils Relative % 55 43 - 77 %   Neutro Abs 5.2 1.7 - 7.7 K/uL   Lymphocytes Relative 36 12 - 46 %   Lymphs Abs 3.4 0.7 - 4.0 K/uL   Monocytes Relative 6 3 - 12 %   Monocytes Absolute 0.6 0.1 - 1.0 K/uL    Eosinophils Relative 3 0 - 5 %   Eosinophils Absolute 0.2 0.0 - 0.7 K/uL   Basophils Relative 0 0 - 1 %   Basophils Absolute 0.0 0.0 - 0.1 K/uL  ABO/Rh     Status: None  Collection Time: 04/06/15  2:53 PM  Result Value Ref Range   ABO/RH(D) B POS   hCG, quantitative, pregnancy     Status: Abnormal   Collection Time: 04/06/15  2:53 PM  Result Value Ref Range   hCG, Beta Chain, Quant, S 7056 (H) <5 mIU/mL    MAU Course  Procedures  MDM Eval to assess for ectopic pregnancy: Labs and U/S ordered U/S shows yolk sac confirming no ectopic  No noted infection  Assessment and Plan  A:  1. Diabetes mellitus in pregnancy in first trimester   2. Abdominal pain affecting pregnancy    P: Discharge to home [facility] asap PNV qd Encourage good Po hydration.   Patient may return to MAU as needed or if her condition were to change or worsen    Paticia Stack 04/06/2015, 1:46 PM

## 2015-04-06 NOTE — Discharge Instructions (Signed)

## 2015-04-07 LAB — RPR: RPR Ser Ql: NONREACTIVE

## 2015-04-07 LAB — GC/CHLAMYDIA PROBE AMP (~~LOC~~) NOT AT ARMC
Chlamydia: NEGATIVE
Neisseria Gonorrhea: NEGATIVE

## 2015-04-07 LAB — HIV ANTIBODY (ROUTINE TESTING W REFLEX): HIV SCREEN 4TH GENERATION: NONREACTIVE

## 2015-04-25 ENCOUNTER — Ambulatory Visit: Payer: Self-pay | Admitting: *Deleted

## 2015-04-25 ENCOUNTER — Encounter: Payer: Medicaid Other | Admitting: Family Medicine

## 2015-04-25 ENCOUNTER — Encounter: Payer: Self-pay | Attending: Family Medicine | Admitting: *Deleted

## 2015-04-25 DIAGNOSIS — O24419 Gestational diabetes mellitus in pregnancy, unspecified control: Secondary | ICD-10-CM

## 2015-04-25 DIAGNOSIS — Z713 Dietary counseling and surveillance: Secondary | ICD-10-CM | POA: Insufficient documentation

## 2015-04-25 NOTE — Progress Notes (Addendum)
Patient has W7PX now complicated by pregnancy. She states her glucose readings FBS ranging 170-200mg /dl.  2hpp approximately 250mg /dl. She did not bring her meter or log book. She is presently taking NPH 16 AM and 20HS, Novolog 16 with meals.  During her last pregnancy she was taking NPH 20 &  24 HS & Novolog 20 units with each meal. As of today she will return to pregnancy dosing. Of NPH 20u AM 24uPM, Novolog 20 U with meals. She will return in 1 week to review her glucose readings. Patient has Pregnancy Medicaid Pending Disp: TrueTrack glucometer : [id number] Exp: 06/18/17

## 2015-05-02 ENCOUNTER — Encounter: Payer: Self-pay | Admitting: Family Medicine

## 2015-05-02 ENCOUNTER — Ambulatory Visit (INDEPENDENT_AMBULATORY_CARE_PROVIDER_SITE_OTHER): Payer: Self-pay | Admitting: Family Medicine

## 2015-05-02 VITALS — BP 116/68 | HR 99 | Wt 274.9 lb

## 2015-05-02 DIAGNOSIS — O0991 Supervision of high risk pregnancy, unspecified, first trimester: Secondary | ICD-10-CM

## 2015-05-02 DIAGNOSIS — O24911 Unspecified diabetes mellitus in pregnancy, first trimester: Secondary | ICD-10-CM

## 2015-05-02 DIAGNOSIS — O3680X1 Pregnancy with inconclusive fetal viability, fetus 1: Secondary | ICD-10-CM

## 2015-05-02 DIAGNOSIS — O3421 Maternal care for scar from previous cesarean delivery: Secondary | ICD-10-CM

## 2015-05-02 DIAGNOSIS — O34219 Maternal care for unspecified type scar from previous cesarean delivery: Secondary | ICD-10-CM

## 2015-05-02 LAB — POCT URINALYSIS DIP (DEVICE)
Bilirubin Urine: NEGATIVE
GLUCOSE, UA: NEGATIVE mg/dL
HGB URINE DIPSTICK: NEGATIVE
Ketones, ur: 15 mg/dL — AB
Leukocytes, UA: NEGATIVE
NITRITE: NEGATIVE
Protein, ur: NEGATIVE mg/dL
Specific Gravity, Urine: >=1.03 (ref 1.005–1.030)
UROBILINOGEN UA: 0.2 mg/dL (ref 0.0–1.0)
pH: 5.5 (ref 5.0–8.0)

## 2015-05-02 LAB — HEPATITIS B SURFACE ANTIGEN: Hepatitis B Surface Ag: NEGATIVE

## 2015-05-02 LAB — TSH: TSH: 0.875 u[IU]/mL (ref 0.350–4.500)

## 2015-05-02 LAB — HEMOGLOBIN A1C
Hgb A1c MFr Bld: 7.9 % — ABNORMAL HIGH
Mean Plasma Glucose: 180 mg/dL — ABNORMAL HIGH

## 2015-05-02 MED ORDER — INSULIN NPH (HUMAN) (ISOPHANE) 100 UNIT/ML ~~LOC~~ SUSP: SUBCUTANEOUS | Status: DC

## 2015-05-02 MED ORDER — ASPIRIN EC 81 MG PO TBEC: 81.0000 mg | DELAYED_RELEASE_TABLET | Freq: Every day | ORAL | Status: DC

## 2015-05-02 NOTE — Progress Notes (Signed)
Subjective:    Carmen Watts is a [id number] [redacted]w[redacted]d being seen today for her first obstetrical visit.  Her obstetrical history is significant for obesity and diabetes. Patient uncertain about breast feed. Pregnancy history fully reviewed.  She has been taking NPH 20 in the AM and 24 in the PM with novolog 20 units with meals. Fasting: 126-189 2hr PP: 110-200 - only one in range.  Patient reports nausea and vomiting.  Filed Vitals:   05/02/15 0914  BP: 116/68  Pulse: 99  Weight: 274 lb 14.4 oz (124.694 kg)    HISTORY: OB History  Gravida Para Term Preterm AB SAB TAB Ectopic Multiple Living  6 3 3  0 2 0 2 0 0 3    # Outcome Date GA Lbr Len/2nd Weight Sex Delivery Anes PTL Lv  6 Current           5 TAB 2015          4 Term 01/09/07 [redacted]w[redacted]d  7 lb 2 oz (3.232 kg) M CS-Unspec Spinal  Y  3 Term 05/30/03 [redacted]w[redacted]d  6 lb 8.5 oz (2.963 kg) F CS-Unspec Spinal N Y  2 Term 11/11/01 [redacted]w[redacted]d  6 lb 9 oz (2.977 kg) M CS-Unspec Spinal N Y  1 TAB              Past Medical History  Diagnosis Date  . Asthma   . Ovarian cyst   . Gonorrhea   . Diabetes mellitus   . Chlamydia   . QMVHQION(629.5)    Past Surgical History  Procedure Laterality Date  . Cesarean section      x3  . Therapeutic abortion     Family History  Problem Relation Age of Onset  . Thyroid disease Mother   . Diabetes Father   . Hypertension Father   . Ovarian cancer Maternal Grandmother   . Breast cancer Maternal Grandmother   . Cancer Maternal Grandmother   . Cancer Maternal Grandfather   . Lung cancer Maternal Grandfather   . Asthma Son      Exam    Uterus:     System:     Skin: normal coloration and turgor, no rashes    Neurologic: oriented, normal, gait normal; reflexes normal and symmetric   Extremities: normal strength, tone, and muscle mass   HEENT PERRLA and extra ocular movement intact   Mouth/Teeth mucous membranes moist, pharynx normal without lesions   Neck supple and no masses   Cardiovascular:  regular rate and rhythm, no murmurs or gallops   Respiratory:  appears well, vitals normal, no respiratory distress, acyanotic, normal RR, ear and throat exam is normal, neck free of mass or lymphadenopathy, chest clear, no wheezing, crepitations, rhonchi, normal symmetric air entry   Abdomen: soft, non-tender; bowel sounds normal; no masses,  no organomegaly      Assessment:    Pregnancy: [id number] Patient Active Problem List   Diagnosis Date Noted  . Diabetes mellitus during pregnancy, antepartum 04/26/2014  . Supervision of  high-risk pregnancy 04/26/2014  . Previous cesarean delivery, antepartum condition or complication 40/08/2724  . RUQ pain 07/28/2012  . 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 Glucose intolerance (impaired glucose tolerance) 10/17/2011  . Neuropathic pain 10/17/2011        Plan:     Initial labs drawn - HGA1c, TSH Opthalmology referral made.  Will need fetal echo ordered. Will order NT screening. Prenatal vitamins. Problem list reviewed and updated. Genetic Screening discussed First  Screen: ordered.  Ultrasound discussed; fetal survey: requested.  Increase NPH to 24 units in AM and 28 units in PM. Return in 1 week with diabetic nurse F/u with OB provider in 2 weeks  ASA 81mg  after 12 weeks.  100% of 45 min visit spent on counseling and coordination of care.   Loma Boston JEHIEL 05/02/2015

## 2015-05-02 NOTE — Progress Notes (Signed)
First trimester screen scheduled for 05/26/2015 @ 8:00AM with MFM.

## 2015-05-02 NOTE — Addendum Note (Signed)
Addended by: Langston Reusing on: 05/02/2015 02:07 PM   Modules accepted: Orders

## 2015-05-02 NOTE — Progress Notes (Signed)
Bedside US for viability = Single IUP, FHR= 175 bpm per PW doppler.  Dr. Nehemiah Settle notified of results.

## 2015-05-02 NOTE — Progress Notes (Signed)
Nutrition note: 1st visit consult & DM diet review Pt has DM & is on insulin. Pt has h/o obesity. Pt has lost 5.1# @ [redacted]w[redacted]d. Pt reports she has had a lot of N&V. Pt has been checking her BS for past week- fasting: 126-189, 2hr pp: 110-200 (only 1 in range). Pt reports usually eating 2 meals & 2 snacks/d as well as ~4 bottles of sweetened lipton green tea. Pt is not taking a PNV. Pt reports having heartburn. Pt reports walking 2-3x/ wk for ~1hr. Pt received verbal & written education on DM diet while pregnant. Encouraged pt to decrease/ eliminate sugar-sweetened beverages. Encouraged PNV. Encouraged at least 30 mins of walking/d. Discussed wt gain goals of 11-20# or 0.5#/ wk. Pt agrees to follow DM diet with 2-3 meals & 1-3 snacks/d and to work on decreasing her sweet tea intake. Pt does not have [place] currently but plans to apply at a later date. F/u in 2-4 wks Vladimir Faster, MS, RD, LDN, [facility]

## 2015-05-02 NOTE — Patient Instructions (Signed)
First Trimester of Pregnancy The first trimester of pregnancy is from week 1 until the end of week 12 (months 1 through 3). A week after a sperm fertilizes an egg, the egg will implant on the wall of the uterus. This embryo will begin to develop into a baby. Genes from you and your partner are forming the baby. The female genes determine whether the baby is a boy or a girl. At 6-8 weeks, the eyes and face are formed, and the heartbeat can be seen on ultrasound. At the end of 12 weeks, all the baby's organs are formed.  Now that you are pregnant, you will want to do everything you can to have a healthy baby. Two of the most important things are to get good prenatal care and to follow your health care provider's instructions. Prenatal care is all the medical care you receive before the baby's birth. This care will help prevent, find, and treat any problems during the pregnancy and childbirth. BODY CHANGES Your body goes through many changes during pregnancy. The changes vary from woman to woman.   You may gain or lose a couple of pounds at first.  You may feel sick to your stomach (nauseous) and throw up (vomit). If the vomiting is uncontrollable, call your health care provider.  You may tire easily.  You may develop headaches that can be relieved by medicines approved by your health care provider.  You may urinate more often. Painful urination may mean you have a bladder infection.  You may develop heartburn as a result of your pregnancy.  You may develop constipation because certain hormones are causing the muscles that push waste through your intestines to slow down.  You may develop hemorrhoids or swollen, bulging veins (varicose veins).  Your breasts may begin to grow larger and become tender. Your nipples may stick out more, and the tissue that surrounds them (areola) may become darker.  Your gums may bleed and may be sensitive to brushing and flossing.  Dark spots or blotches (chloasma,  mask of pregnancy) may develop on your face. This will likely fade after the baby is born.  Your menstrual periods will stop.  You may have a loss of appetite.  You may develop cravings for certain kinds of food.  You may have changes in your emotions from day to day, such as being excited to be pregnant or being concerned that something may go wrong with the pregnancy and baby.  You may have more vivid and strange dreams.  You may have changes in your hair. These can include thickening of your hair, rapid growth, and changes in texture. Some women also have hair loss during or after pregnancy, or hair that feels dry or thin. Your hair will most likely return to normal after your baby is born. WHAT TO EXPECT AT YOUR PRENATAL VISITS During a routine prenatal visit:  You will be weighed to make sure you and the baby are growing normally.  Your blood pressure will be taken.  Your abdomen will be measured to track your baby's growth.  The fetal heartbeat will be listened to starting around week 10 or 12 of your pregnancy.  Test results from any previous visits will be discussed. Your health care provider may ask you:  How you are feeling.  If you are feeling the baby move.  If you have had any abnormal symptoms, such as leaking fluid, bleeding, severe headaches, or abdominal cramping.  If you have any questions. Other tests   that may be performed during your first trimester include:  Blood tests to find your blood type and to check for the presence of any previous infections. They will also be used to check for low iron levels (anemia) and Rh antibodies. Later in the pregnancy, blood tests for diabetes will be done along with other tests if problems develop.  Urine tests to check for infections, diabetes, or protein in the urine.  An ultrasound to confirm the proper growth and development of the baby.  An amniocentesis to check for possible genetic problems.  Fetal screens for  spina bifida and Down syndrome.  You may need other tests to make sure you and the baby are doing well. HOME CARE INSTRUCTIONS  Medicines  Follow your health care provider's instructions regarding medicine use. Specific medicines may be either safe or unsafe to take during pregnancy.  Take your prenatal vitamins as directed.  If you develop constipation, try taking a stool softener if your health care provider approves. Diet  Eat regular, well-balanced meals. Choose a variety of foods, such as meat or vegetable-based protein, fish, milk and low-fat dairy products, vegetables, fruits, and whole grain breads and cereals. Your health care provider will help you determine the amount of weight gain that is right for you.  Avoid raw meat and uncooked cheese. These carry germs that can cause birth defects in the baby.  Eating four or five small meals rather than three large meals a day may help relieve nausea and vomiting. If you start to feel nauseous, eating a few soda crackers can be helpful. Drinking liquids between meals instead of during meals also seems to help nausea and vomiting.  If you develop constipation, eat more high-fiber foods, such as fresh vegetables or fruit and whole grains. Drink enough fluids to keep your urine clear or pale yellow. Activity and Exercise  Exercise only as directed by your health care provider. Exercising will help you:  Control your weight.  Stay in shape.  Be prepared for labor and delivery.  Experiencing pain or cramping in the lower abdomen or low back is a good sign that you should stop exercising. Check with your health care provider before continuing normal exercises.  Try to avoid standing for long periods of time. Move your legs often if you must stand in one place for a long time.  Avoid heavy lifting.  Wear low-heeled shoes, and practice good posture.  You may continue to have sex unless your health care provider directs you  otherwise. Relief of Pain or Discomfort  Wear a good support bra for breast tenderness.   Take warm sitz baths to soothe any pain or discomfort caused by hemorrhoids. Use hemorrhoid cream if your health care provider approves.   Rest with your legs elevated if you have leg cramps or low back pain.  If you develop varicose veins in your legs, wear support hose. Elevate your feet for 15 minutes, 3-4 times a day. Limit salt in your diet. Prenatal Care  Schedule your prenatal visits by the twelfth week of pregnancy. They are usually scheduled monthly at first, then more often in the last 2 months before delivery.  Write down your questions. Take them to your prenatal visits.  Keep all your prenatal visits as directed by your health care provider. Safety  Wear your seat belt at all times when driving.  Make a list of emergency phone numbers, including numbers for family, friends, the hospital, and police and fire departments. General Tips    Ask your health care provider for a referral to a local prenatal education class. Begin classes no later than at the beginning of month 6 of your pregnancy.  Ask for help if you have counseling or nutritional needs during pregnancy. Your health care provider can offer advice or refer you to specialists for help with various needs.  Do not use hot tubs, steam rooms, or saunas.  Do not douche or use tampons or scented sanitary pads.  Do not cross your legs for long periods of time.  Avoid cat litter boxes and soil used by cats. These carry germs that can cause birth defects in the baby and possibly loss of the fetus by miscarriage or stillbirth.  Avoid all smoking, herbs, alcohol, and medicines not prescribed by your health care provider. Chemicals in these affect the formation and growth of the baby.  Schedule a dentist appointment. At home, brush your teeth with a soft toothbrush and be gentle when you floss. SEEK MEDICAL CARE IF:   You have  dizziness.  You have mild pelvic cramps, pelvic pressure, or nagging pain in the abdominal area.  You have persistent nausea, vomiting, or diarrhea.  You have a bad smelling vaginal discharge.  You have pain with urination.  You notice increased swelling in your face, hands, legs, or ankles. SEEK IMMEDIATE MEDICAL CARE IF:   You have a fever.  You are leaking fluid from your vagina.  You have spotting or bleeding from your vagina.  You have severe abdominal cramping or pain.  You have rapid weight gain or loss.  You vomit blood or material that looks like coffee grounds.  You are exposed to German measles and have never had them.  You are exposed to fifth disease or chickenpox.  You develop a severe headache.  You have shortness of breath.  You have any kind of trauma, such as from a fall or a car accident. Document Released: 10/30/2001 Document Revised: 03/22/2014 Document Reviewed: 09/15/2013 Exit[employer] Patient Information 2015 [employer]. This information is not intended to replace advice given to you by your health care provider. Make sure you discuss any questions you have with your health care provider.  

## 2015-05-03 LAB — ANTIBODY SCREEN: ANTIBODY SCREEN: NEGATIVE

## 2015-05-04 LAB — HEMOGLOBINOPATHY EVALUATION
HGB A: 97.3 % (ref 96.8–97.8)
HGB F QUANT: 0 % (ref 0.0–2.0)
Hemoglobin Other: 0 %
Hgb A2 Quant: 2.7 % (ref 2.2–3.2)
Hgb S Quant: 0 %

## 2015-05-05 LAB — CULTURE, OB URINE: Colony Count: 2000

## 2015-05-09 ENCOUNTER — Ambulatory Visit: Payer: Self-pay | Admitting: *Deleted

## 2015-05-09 ENCOUNTER — Encounter: Payer: Self-pay | Admitting: *Deleted

## 2015-05-09 DIAGNOSIS — O24911 Unspecified diabetes mellitus in pregnancy, first trimester: Secondary | ICD-10-CM

## 2015-05-09 DIAGNOSIS — O0991 Supervision of high risk pregnancy, unspecified, first trimester: Secondary | ICD-10-CM

## 2015-05-09 LAB — COMPLETE METABOLIC PANEL WITH GFR
ALT: <8 U/L (ref 0–35)
AST: 6 U/L (ref 0–37)
Albumin: 3.5 g/dL (ref 3.5–5.2)
Alkaline Phosphatase: 50 U/L (ref 39–117)
BILIRUBIN TOTAL: 0.4 mg/dL (ref 0.2–1.2)
BUN: 6 mg/dL (ref 6–23)
CO2: 22 meq/L (ref 19–32)
CREATININE: 0.58 mg/dL (ref 0.50–1.10)
Calcium: 9.2 mg/dL (ref 8.4–10.5)
Chloride: 101 mEq/L (ref 96–112)
GFR, Est Non African American: >89 mL/min
Glucose, Bld: 178 mg/dL — ABNORMAL HIGH (ref 70–99)
Potassium: 3.8 mEq/L (ref 3.5–5.3)
Sodium: 135 mEq/L (ref 135–145)
TOTAL PROTEIN: 6.2 g/dL (ref 6.0–8.3)

## 2015-05-09 NOTE — Progress Notes (Signed)
Subjective:     Patient ID: Carmen Watts, female   DOB: September 16, 1981, 34 y.o.   MRN: [id number]  HPI   Review of Systems     Objective:   Physical Exam     Assessment:    Patient on NPH insulin @ 24 units in AM and 28 units at bedtime. Also on Novolog @ 20 units at each meal. Reviewed her BG Logs, FBG improved but still elevated with average of 120 mg/dl over past week. Post meal BG averaging @ 130 mg/dl.      Plan:     Patient instructed to increase her insulin doses as follows: NPH in AM, no change NPH @ bedtime, increase ot 30 units to address fasting BG Novolog increase to 22 units @ each meal to address post meal BG's Provided 1 box of 50 True Track strips

## 2015-05-10 LAB — PROTEIN, URINE, 24 HOUR
PROTEIN, URINE: 9 mg/dL (ref 5–24)
Protein, 24H Urine: 95 mg/d (ref <150)

## 2015-05-10 LAB — CREATININE CLEARANCE, URINE, 24 HOUR
CREAT CLEAR: 198 mL/min — AB (ref 75–115)
CREATININE 24H UR: 1656 mg/d (ref 700–1800)
Creatinine, Urine: 157.7 mg/dL
Creatinine: 0.58 mg/dL (ref 0.50–1.10)

## 2015-05-16 ENCOUNTER — Ambulatory Visit (INDEPENDENT_AMBULATORY_CARE_PROVIDER_SITE_OTHER): Payer: Self-pay | Admitting: Obstetrics and Gynecology

## 2015-05-16 ENCOUNTER — Encounter: Payer: Self-pay | Admitting: Obstetrics and Gynecology

## 2015-05-16 VITALS — BP 135/68 | HR 78 | Wt 272.8 lb

## 2015-05-16 DIAGNOSIS — O3421 Maternal care for scar from previous cesarean delivery: Secondary | ICD-10-CM

## 2015-05-16 DIAGNOSIS — O0991 Supervision of high risk pregnancy, unspecified, first trimester: Secondary | ICD-10-CM

## 2015-05-16 DIAGNOSIS — O34219 Maternal care for unspecified type scar from previous cesarean delivery: Secondary | ICD-10-CM

## 2015-05-16 DIAGNOSIS — O24911 Unspecified diabetes mellitus in pregnancy, first trimester: Secondary | ICD-10-CM

## 2015-05-16 LAB — POCT URINALYSIS DIP (DEVICE)
Bilirubin Urine: NEGATIVE
Glucose, UA: 100 mg/dL — AB
Hgb urine dipstick: NEGATIVE
KETONES UR: NEGATIVE mg/dL
Leukocytes, UA: NEGATIVE
Nitrite: NEGATIVE
PH: 5.5 (ref 5.0–8.0)
Protein, ur: NEGATIVE mg/dL
Urobilinogen, UA: 0.2 mg/dL (ref 0.0–1.0)

## 2015-05-16 NOTE — Progress Notes (Signed)
Pt reports having tenderness on right side of sinus Edema- ankles/feet

## 2015-05-16 NOTE — Progress Notes (Signed)
Subjective:  Carmen Watts is a 34 y.o. [id number] at [redacted]w[redacted]d being seen today for ongoing prenatal care.  Patient reports no complaints.  Contractions: Not present.  Vag. Bleeding: None.  . Denies leaking of fluid.   The following portions of the patient's history were reviewed and updated as appropriate: allergies, current medications, past family history, past medical history, past social history, past surgical history and problem list.   Objective:   Filed Vitals:   05/16/15 0829 05/16/15 0834  BP: 145/85 135/68  Pulse: 78   Weight: 272 lb 12.8 oz (123.741 kg)     Fetal Status:           General:  Alert, oriented and cooperative. Patient is in no acute distress.  Skin: Skin is warm and dry. No rash noted.   Cardiovascular: Normal heart rate noted  Respiratory: Normal respiratory effort, no problems with respiration noted  Abdomen: Soft, gravid, appropriate for gestational age. Pain/Pressure: Absent     Vaginal: Vag. Bleeding: None.       Cervix: Not evaluated        Extremities: Normal range of motion.  Edema: Trace  Mental Status: Normal mood and affect. Normal behavior. Normal judgment and thought content.   Urinalysis: Urine Protein: Negative Urine Glucose: 1+  Assessment and Plan:  Pregnancy: [id number] at [redacted]w[redacted]d  1. Supervision of high risk pregnancy, antepartum, first trimester Follow up first screen on 05/26/2015  2. Previous cesarean delivery, antepartum condition or complication Desires BTL with [facility]- still needs to sign papers  3. Diabetes mellitus during pregnancy, antepartum, first trimester Patient did not bring log book but reports fasting in the 110's and 2 hr pp as high as 141. Advised to review portions and to consume a protein rich snack at bedtime. Continue on current NPH regimen of 24 in am and 28 in PM   General obstetric precautions including but not limited to vaginal bleeding, contractions, leaking of fluid and fetal movement were reviewed in detail with the  patient.  Please refer to After Visit Summary for other counseling recommendations.   Return in about 2 weeks (around 05/30/2015).   Mora Bellman, MD

## 2015-05-24 ENCOUNTER — Other Ambulatory Visit (HOSPITAL_COMMUNITY): Payer: Self-pay

## 2015-05-24 ENCOUNTER — Ambulatory Visit (HOSPITAL_COMMUNITY): Payer: Self-pay

## 2015-05-26 ENCOUNTER — Ambulatory Visit (HOSPITAL_COMMUNITY)
Admission: RE | Admit: 2015-05-26 | Discharge: 2015-05-26 | Disposition: A | Discharge status: Home / Self Care | Payer: Self-pay | Source: Ambulatory Visit | Attending: Family Medicine | Admitting: Family Medicine

## 2015-05-26 ENCOUNTER — Encounter (HOSPITAL_COMMUNITY): Payer: Self-pay

## 2015-05-26 VITALS — BP 128/76 | HR 96 | Wt 274.0 lb

## 2015-05-26 DIAGNOSIS — E119 Type 2 diabetes mellitus without complications: Secondary | ICD-10-CM | POA: Insufficient documentation

## 2015-05-26 DIAGNOSIS — Z3A13 13 weeks gestation of pregnancy: Secondary | ICD-10-CM | POA: Insufficient documentation

## 2015-05-26 DIAGNOSIS — O0991 Supervision of high risk pregnancy, unspecified, first trimester: Secondary | ICD-10-CM

## 2015-05-26 DIAGNOSIS — Z3682 Encounter for antenatal screening for nuchal translucency: Secondary | ICD-10-CM | POA: Insufficient documentation

## 2015-05-26 DIAGNOSIS — O3421 Maternal care for scar from previous cesarean delivery: Secondary | ICD-10-CM | POA: Insufficient documentation

## 2015-05-26 DIAGNOSIS — O24311 Unspecified pre-existing diabetes mellitus in pregnancy, first trimester: Secondary | ICD-10-CM | POA: Insufficient documentation

## 2015-05-26 DIAGNOSIS — O24911 Unspecified diabetes mellitus in pregnancy, first trimester: Secondary | ICD-10-CM

## 2015-05-26 DIAGNOSIS — Z36 Encounter for antenatal screening of mother: Secondary | ICD-10-CM | POA: Insufficient documentation

## 2015-05-30 ENCOUNTER — Ambulatory Visit (INDEPENDENT_AMBULATORY_CARE_PROVIDER_SITE_OTHER): Payer: Self-pay | Admitting: Obstetrics and Gynecology

## 2015-05-30 ENCOUNTER — Encounter: Payer: Self-pay | Admitting: Obstetrics and Gynecology

## 2015-05-30 VITALS — BP 133/69 | HR 94 | Temp 98.1°F | Wt 271.7 lb

## 2015-05-30 DIAGNOSIS — O3421 Maternal care for scar from previous cesarean delivery: Secondary | ICD-10-CM

## 2015-05-30 DIAGNOSIS — O34219 Maternal care for unspecified type scar from previous cesarean delivery: Secondary | ICD-10-CM

## 2015-05-30 DIAGNOSIS — O24912 Unspecified diabetes mellitus in pregnancy, second trimester: Secondary | ICD-10-CM

## 2015-05-30 DIAGNOSIS — O0992 Supervision of high risk pregnancy, unspecified, second trimester: Secondary | ICD-10-CM

## 2015-05-30 LAB — POCT URINALYSIS DIP (DEVICE)
Bilirubin Urine: NEGATIVE
GLUCOSE, UA: 250 mg/dL — AB
HGB URINE DIPSTICK: NEGATIVE
KETONES UR: 40 mg/dL — AB
Leukocytes, UA: NEGATIVE
NITRITE: NEGATIVE
PH: 5.5 (ref 5.0–8.0)
PROTEIN: 30 mg/dL — AB
Specific Gravity, Urine: >=1.03 (ref 1.005–1.030)
UROBILINOGEN UA: 0.2 mg/dL (ref 0.0–1.0)

## 2015-05-30 NOTE — Progress Notes (Signed)
.   Subjective:  Carmen Watts is a 34 y.o. [id number] at [redacted]w[redacted]d being seen today for ongoing prenatal care.  Patient reports no complaints.  Contractions: Not present.  Vag. Bleeding: None.  . Denies leaking of fluid.   The following portions of the patient's history were reviewed and updated as appropriate: allergies, current medications, past family history, past medical history, past social history, past surgical history and problem list.   Objective:   Filed Vitals:   05/30/15 0857  BP: 133/69  Pulse: 94  Temp: 98.1 F (36.7 C)  Weight: 271 lb 11.2 oz (123.242 kg)    Fetal Status:           General:  Alert, oriented and cooperative. Patient is in no acute distress.  Skin: Skin is warm and dry. No rash noted.   Cardiovascular: Normal heart rate noted  Respiratory: Normal respiratory effort, no problems with respiration noted  Abdomen: Soft, gravid, appropriate for gestational age. Pain/Pressure: Absent     Vaginal: Vag. Bleeding: None.       Cervix: Not evaluated        Extremities: Normal range of motion.  Edema: None  Mental Status: Normal mood and affect. Normal behavior. Normal judgment and thought content.   Urinalysis: Urine Protein: Negative Urine Glucose: Negative  Assessment and Plan:  Pregnancy: [id number] at [redacted]w[redacted]d  1. Previous cesarean delivery, antepartum condition or complication Will be scheduled for repeat with BTL  2. Supervision of high risk pregnancy, antepartum, second trimester Patient is doing well   3. Diabetes mellitus during pregnancy, antepartum, second trimester CBGs reviewed and most out of range. Fasting 100-110 pp 119-154 Will increase NPH to 26 units in the am and 32 in the evening Continue Nololog 22 with meals   General obstetric precautions including but not limited to vaginal bleeding, contractions, leaking of fluid and fetal movement were reviewed in detail with the patient.  Please refer to After Visit Summary for other counseling  recommendations.   Return in about 2 weeks (around 06/13/2015).   Mora Bellman, MD

## 2015-06-01 ENCOUNTER — Other Ambulatory Visit (HOSPITAL_COMMUNITY): Payer: Self-pay | Admitting: Family Medicine

## 2015-06-13 ENCOUNTER — Ambulatory Visit (INDEPENDENT_AMBULATORY_CARE_PROVIDER_SITE_OTHER): Payer: Self-pay | Admitting: Family Medicine

## 2015-06-13 VITALS — BP 117/61 | HR 89 | Temp 98.2°F | Wt 269.2 lb

## 2015-06-13 DIAGNOSIS — O24911 Unspecified diabetes mellitus in pregnancy, first trimester: Secondary | ICD-10-CM

## 2015-06-13 DIAGNOSIS — O0992 Supervision of high risk pregnancy, unspecified, second trimester: Secondary | ICD-10-CM

## 2015-06-13 DIAGNOSIS — O24912 Unspecified diabetes mellitus in pregnancy, second trimester: Secondary | ICD-10-CM

## 2015-06-13 LAB — POCT URINALYSIS DIP (DEVICE)
Bilirubin Urine: NEGATIVE
Glucose, UA: NEGATIVE mg/dL
HGB URINE DIPSTICK: NEGATIVE
Ketones, ur: 15 mg/dL — AB
LEUKOCYTES UA: NEGATIVE
Nitrite: NEGATIVE
PH: 5.5 (ref 5.0–8.0)
Protein, ur: 30 mg/dL — AB
Specific Gravity, Urine: >=1.03 (ref 1.005–1.030)
Urobilinogen, UA: 0.2 mg/dL (ref 0.0–1.0)

## 2015-06-13 MED ORDER — INSULIN NPH (HUMAN) (ISOPHANE) 100 UNIT/ML ~~LOC~~ SUSP
SUBCUTANEOUS | Status: DC
Start: 2015-06-13 — End: 2015-06-27

## 2015-06-13 MED ORDER — INSULIN ASPART 100 UNIT/ML FLEXPEN: 22.0000 [IU] | PEN_INJECTOR | Freq: Three times a day (TID) | SUBCUTANEOUS | Status: DC

## 2015-06-13 NOTE — Patient Instructions (Signed)
Second Trimester of Pregnancy The second trimester is from week 13 through week 28, months 4 through 6. The second trimester is often a time when you feel your best. Your body has also adjusted to being pregnant, and you begin to feel better physically. Usually, morning sickness has lessened or quit completely, you may have more energy, and you may have an increase in appetite. The second trimester is also a time when the fetus is growing rapidly. At the end of the sixth month, the fetus is about 9 inches long and weighs about 1 pounds. You will likely begin to feel the baby move (quickening) between 18 and 20 weeks of the pregnancy. BODY CHANGES Your body goes through many changes during pregnancy. The changes vary from woman to woman.   Your weight will continue to increase. You will notice your lower abdomen bulging out.  You may begin to get stretch marks on your hips, abdomen, and breasts.  You may develop headaches that can be relieved by medicines approved by your health care provider.  You may urinate more often because the fetus is pressing on your bladder.  You may develop or continue to have heartburn as a result of your pregnancy.  You may develop constipation because certain hormones are causing the muscles that push waste through your intestines to slow down.  You may develop hemorrhoids or swollen, bulging veins (varicose veins).  You may have back pain because of the weight gain and pregnancy hormones relaxing your joints between the bones in your pelvis and as a result of a shift in weight and the muscles that support your balance.  Your breasts will continue to grow and be tender.  Your gums may bleed and may be sensitive to brushing and flossing.  Dark spots or blotches (chloasma, mask of pregnancy) may develop on your face. This will likely fade after the baby is born.  A dark line from your belly button to the pubic area (linea nigra) may appear. This will likely  fade after the baby is born.  You may have changes in your hair. These can include thickening of your hair, rapid growth, and changes in texture. Some women also have hair loss during or after pregnancy, or hair that feels dry or thin. Your hair will most likely return to normal after your baby is born. WHAT TO EXPECT AT YOUR PRENATAL VISITS During a routine prenatal visit:  You will be weighed to make sure you and the fetus are growing normally.  Your blood pressure will be taken.  Your abdomen will be measured to track your baby's growth.  The fetal heartbeat will be listened to.  Any test results from the previous visit will be discussed. Your health care provider may ask you:  How you are feeling.  If you are feeling the baby move.  If you have had any abnormal symptoms, such as leaking fluid, bleeding, severe headaches, or abdominal cramping.  If you have any questions. Other tests that may be performed during your second trimester include:  Blood tests that check for:  Low iron levels (anemia).  Gestational diabetes (between 24 and 28 weeks).  Rh antibodies.  Urine tests to check for infections, diabetes, or protein in the urine.  An ultrasound to confirm the proper growth and development of the baby.  An amniocentesis to check for possible genetic problems.  Fetal screens for spina bifida and Down syndrome. HOME CARE INSTRUCTIONS   Avoid all smoking, herbs, alcohol, and unprescribed   drugs. These chemicals affect the formation and growth of the baby.  Follow your health care provider's instructions regarding medicine use. There are medicines that are either safe or unsafe to take during pregnancy.  Exercise only as directed by your health care provider. Experiencing uterine cramps is a good sign to stop exercising.  Continue to eat regular, healthy meals.  Wear a good support bra for breast tenderness.  Do not use hot tubs, steam rooms, or saunas.  Wear  your seat belt at all times when driving.  Avoid raw meat, uncooked cheese, cat litter boxes, and soil used by cats. These carry germs that can cause birth defects in the baby.  Take your prenatal vitamins.  Try taking a stool softener (if your health care provider approves) if you develop constipation. Eat more high-fiber foods, such as fresh vegetables or fruit and whole grains. Drink plenty of fluids to keep your urine clear or pale yellow.  Take warm sitz baths to soothe any pain or discomfort caused by hemorrhoids. Use hemorrhoid cream if your health care provider approves.  If you develop varicose veins, wear support hose. Elevate your feet for 15 minutes, 3-4 times a day. Limit salt in your diet.  Avoid heavy lifting, wear low heel shoes, and practice good posture.  Rest with your legs elevated if you have leg cramps or low back pain.  Visit your dentist if you have not gone yet during your pregnancy. Use a soft toothbrush to brush your teeth and be gentle when you floss.  A sexual relationship may be continued unless your health care provider directs you otherwise.  Continue to go to all your prenatal visits as directed by your health care provider. SEEK MEDICAL CARE IF:   You have dizziness.  You have mild pelvic cramps, pelvic pressure, or nagging pain in the abdominal area.  You have persistent nausea, vomiting, or diarrhea.  You have a bad smelling vaginal discharge.  You have pain with urination. SEEK IMMEDIATE MEDICAL CARE IF:   You have a fever.  You are leaking fluid from your vagina.  You have spotting or bleeding from your vagina.  You have severe abdominal cramping or pain.  You have rapid weight gain or loss.  You have shortness of breath with chest pain.  You notice sudden or extreme swelling of your face, hands, ankles, feet, or legs.  You have not felt your baby move in over an hour.  You have severe headaches that do not go away with  medicine.  You have vision changes. Document Released: 10/30/2001 Document Revised: 11/10/2013 Document Reviewed: 01/06/2013 [employer] Patient Information 2015 [employer]. This information is not intended to replace advice given to you by your health care provider. Make sure you discuss any questions you have with your health care provider.  Breastfeeding Deciding to breastfeed is one of the best choices you can make for you and your baby. A change in hormones during pregnancy causes your breast tissue to grow and increases the number and size of your milk ducts. These hormones also allow proteins, sugars, and fats from your blood supply to make breast milk in your milk-producing glands. Hormones prevent breast milk from being released before your baby is born as well as prompt milk flow after birth. Once breastfeeding has begun, thoughts of your baby, as well as his or her sucking or crying, can stimulate the release of milk from your milk-producing glands.  BENEFITS OF BREASTFEEDING For Your Baby  Your first   milk (colostrum) helps your baby's digestive system function better.   There are antibodies in your milk that help your baby fight off infections.   Your baby has a lower incidence of asthma, allergies, and sudden infant death syndrome.   The nutrients in breast milk are better for your baby than infant formulas and are designed uniquely for your baby's needs.   Breast milk improves your baby's brain development.   Your baby is less likely to develop other conditions, such as childhood obesity, asthma, or type 2 diabetes mellitus.  For You   Breastfeeding helps to create a very special bond between you and your baby.   Breastfeeding is convenient. Breast milk is always available at the correct temperature and costs nothing.   Breastfeeding helps to burn calories and helps you lose the weight gained during pregnancy.   Breastfeeding makes your uterus contract to its  prepregnancy size faster and slows bleeding (lochia) after you give birth.   Breastfeeding helps to lower your risk of developing type 2 diabetes mellitus, osteoporosis, and breast or ovarian cancer later in life. SIGNS THAT YOUR BABY IS HUNGRY Early Signs of Hunger  Increased alertness or activity.  Stretching.  Movement of the head from side to side.  Movement of the head and opening of the mouth when the corner of the mouth or cheek is stroked (rooting).  Increased sucking sounds, smacking lips, cooing, sighing, or squeaking.  Hand-to-mouth movements.  Increased sucking of fingers or hands. Late Signs of Hunger  Fussing.  Intermittent crying. Extreme Signs of Hunger Signs of extreme hunger will require calming and consoling before your baby will be able to breastfeed successfully. Do not wait for the following signs of extreme hunger to occur before you initiate breastfeeding:   Restlessness.  A loud, strong cry.   Screaming. BREASTFEEDING BASICS Breastfeeding Initiation  Find a comfortable place to sit or lie down, with your neck and back well supported.  Place a pillow or rolled up blanket under your baby to bring him or her to the level of your breast (if you are seated). Nursing pillows are specially designed to help support your arms and your baby while you breastfeed.  Make sure that your baby's abdomen is facing your abdomen.   Gently massage your breast. With your fingertips, massage from your chest wall toward your nipple in a circular motion. This encourages milk flow. You may need to continue this action during the feeding if your milk flows slowly.  Support your breast with 4 fingers underneath and your thumb above your nipple. Make sure your fingers are well away from your nipple and your baby's mouth.   Stroke your baby's lips gently with your finger or nipple.   When your baby's mouth is open wide enough, quickly bring your baby to your breast,  placing your entire nipple and as much of the colored area around your nipple (areola) as possible into your baby's mouth.   More areola should be visible above your baby's upper lip than below the lower lip.   Your baby's tongue should be between his or her lower gum and your breast.   Ensure that your baby's mouth is correctly positioned around your nipple (latched). Your baby's lips should create a seal on your breast and be turned out (everted).  It is common for your baby to suck about 2-3 minutes in order to start the flow of breast milk. Latching Teaching your baby how to latch on to your breast   properly is very important. An improper latch can cause nipple pain and decreased milk supply for you and poor weight gain in your baby. Also, if your baby is not latched onto your nipple properly, he or she may swallow some air during feeding. This can make your baby fussy. Burping your baby when you switch breasts during the feeding can help to get rid of the air. However, teaching your baby to latch on properly is still the best way to prevent fussiness from swallowing air while breastfeeding. Signs that your baby has successfully latched on to your nipple:    Silent tugging or silent sucking, without causing you pain.   Swallowing heard between every 3-4 sucks.    Muscle movement above and in front of his or her ears while sucking.  Signs that your baby has not successfully latched on to nipple:   Sucking sounds or smacking sounds from your baby while breastfeeding.  Nipple pain. If you think your baby has not latched on correctly, slip your finger into the corner of your baby's mouth to break the suction and place it between your baby's gums. Attempt breastfeeding initiation again. Signs of Successful Breastfeeding Signs from your baby:   A gradual decrease in the number of sucks or complete cessation of sucking.   Falling asleep.   Relaxation of his or her body.    Retention of a small amount of milk in his or her mouth.   Letting go of your breast by himself or herself. Signs from you:  Breasts that have increased in firmness, weight, and size 1-3 hours after feeding.   Breasts that are softer immediately after breastfeeding.  Increased milk volume, as well as a change in milk consistency and color by the fifth day of breastfeeding.   Nipples that are not sore, cracked, or bleeding. Signs That Your Baby is Getting Enough Milk  Wetting at least 3 diapers in a 24-hour period. The urine should be clear and pale yellow by age 5 days.  At least 3 stools in a 24-hour period by age 5 days. The stool should be soft and yellow.  At least 3 stools in a 24-hour period by age 7 days. The stool should be seedy and yellow.  No loss of weight greater than 10% of birth weight during the first 3 days of age.  Average weight gain of 4-7 ounces (113-198 g) per week after age 4 days.  Consistent daily weight gain by age 5 days, without weight loss after the age of 2 weeks. After a feeding, your baby may spit up a small amount. This is common. BREASTFEEDING FREQUENCY AND DURATION Frequent feeding will help you make more milk and can prevent sore nipples and breast engorgement. Breastfeed when you feel the need to reduce the fullness of your breasts or when your baby shows signs of hunger. This is called "breastfeeding on demand." Avoid introducing a pacifier to your baby while you are working to establish breastfeeding (the first 4-6 weeks after your baby is born). After this time you may choose to use a pacifier. Research has shown that pacifier use during the first year of a baby's life decreases the risk of sudden infant death syndrome (SIDS). Allow your baby to feed on each breast as long as he or she wants. Breastfeed until your baby is finished feeding. When your baby unlatches or falls asleep while feeding from the first breast, offer the second breast.  Because newborns are often sleepy in the   first few weeks of life, you may need to awaken your baby to get him or her to feed. Breastfeeding times will vary from baby to baby. However, the following rules can serve as a guide to help you ensure that your baby is properly fed:  Newborns (babies 4 weeks of age or younger) may breastfeed every 1-3 hours.  Newborns should not go longer than 3 hours during the day or 5 hours during the night without breastfeeding.  You should breastfeed your baby a minimum of 8 times in a 24-hour period until you begin to introduce solid foods to your baby at around 6 months of age. BREAST MILK PUMPING Pumping and storing breast milk allows you to ensure that your baby is exclusively fed your breast milk, even at times when you are unable to breastfeed. This is especially important if you are going back to work while you are still breastfeeding or when you are not able to be present during feedings. Your lactation consultant can give you guidelines on how long it is safe to store breast milk.  A breast pump is a machine that allows you to pump milk from your breast into a sterile bottle. The pumped breast milk can then be stored in a refrigerator or freezer. Some breast pumps are operated by hand, while others use electricity. Ask your lactation consultant which type will work best for you. Breast pumps can be purchased, but some hospitals and breastfeeding support groups lease breast pumps on a monthly basis. A lactation consultant can teach you how to hand express breast milk, if you prefer not to use a pump.  CARING FOR YOUR BREASTS WHILE YOU BREASTFEED Nipples can become dry, cracked, and sore while breastfeeding. The following recommendations can help keep your breasts moisturized and healthy:  Avoid using soap on your nipples.   Wear a supportive bra. Although not required, special nursing bras and tank tops are designed to allow access to your breasts for  breastfeeding without taking off your entire bra or top. Avoid wearing underwire-style bras or extremely tight bras.  Air dry your nipples for 3-4minutes after each feeding.   Use only cotton bra pads to absorb leaked breast milk. Leaking of breast milk between feedings is normal.   Use lanolin on your nipples after breastfeeding. Lanolin helps to maintain your skin's normal moisture barrier. If you use pure lanolin, you do not need to wash it off before feeding your baby again. Pure lanolin is not toxic to your baby. You may also hand express a few drops of breast milk and gently massage that milk into your nipples and allow the milk to air dry. In the first few weeks after giving birth, some women experience extremely full breasts (engorgement). Engorgement can make your breasts feel heavy, warm, and tender to the touch. Engorgement peaks within 3-5 days after you give birth. The following recommendations can help ease engorgement:  Completely empty your breasts while breastfeeding or pumping. You may want to start by applying warm, moist heat (in the shower or with warm water-soaked hand towels) just before feeding or pumping. This increases circulation and helps the milk flow. If your baby does not completely empty your breasts while breastfeeding, pump any extra milk after he or she is finished.  Wear a snug bra (nursing or regular) or tank top for 1-2 days to signal your body to slightly decrease milk production.  Apply ice packs to your breasts, unless this is too uncomfortable for you.    Make sure that your baby is latched on and positioned properly while breastfeeding. If engorgement persists after 48 hours of following these recommendations, contact your health care provider or a lactation consultant. OVERALL HEALTH CARE RECOMMENDATIONS WHILE BREASTFEEDING  Eat healthy foods. Alternate between meals and snacks, eating 3 of each per day. Because what you eat affects your breast milk,  some of the foods may make your baby more irritable than usual. Avoid eating these foods if you are sure that they are negatively affecting your baby.  Drink milk, fruit juice, and water to satisfy your thirst (about 10 glasses a day).   Rest often, relax, and continue to take your prenatal vitamins to prevent fatigue, stress, and anemia.  Continue breast self-awareness checks.  Avoid chewing and smoking tobacco.  Avoid alcohol and drug use. Some medicines that may be harmful to your baby can pass through breast milk. It is important to ask your health care provider before taking any medicine, including all over-the-counter and prescription medicine as well as vitamin and herbal supplements. It is possible to become pregnant while breastfeeding. If birth control is desired, ask your health care provider about options that will be safe for your baby. SEEK MEDICAL CARE IF:   You feel like you want to stop breastfeeding or have become frustrated with breastfeeding.  You have painful breasts or nipples.  Your nipples are cracked or bleeding.  Your breasts are red, tender, or warm.  You have a swollen area on either breast.  You have a fever or chills.  You have nausea or vomiting.  You have drainage other than breast milk from your nipples.  Your breasts do not become full before feedings by the fifth day after you give birth.  You feel sad and depressed.  Your baby is too sleepy to eat well.  Your baby is having trouble sleeping.   Your baby is wetting less than 3 diapers in a 24-hour period.  Your baby has less than 3 stools in a 24-hour period.  Your baby's skin or the white part of his or her eyes becomes yellow.   Your baby is not gaining weight by 5 days of age. SEEK IMMEDIATE MEDICAL CARE IF:   Your baby is overly tired (lethargic) and does not want to wake up and feed.  Your baby develops an unexplained fever. Document Released: 11/05/2005 Document Revised:  11/10/2013 Document Reviewed: 04/29/2013 [employer] Patient Information 2015 [employer]. This information is not intended to replace advice given to you by your health care provider. Make sure you discuss any questions you have with your health care provider.  

## 2015-06-13 NOTE — Progress Notes (Signed)
Breastfeeding tip of the week reviewed Pt complains of excessive dry mouth, ketones:15

## 2015-06-13 NOTE — Progress Notes (Signed)
Subjective:  Carmen Watts is a 34 y.o. [phone] at [redacted]w[redacted]d being seen today for ongoing prenatal care.  Patient reports no complaints.  Contractions: Not present.  Vag. Bleeding: None.  . Denies leaking of fluid.   The following portions of the patient's history were reviewed and updated as appropriate: allergies, current medications, past family history, past medical history, past social history, past surgical history and problem list.   Objective:   Filed Vitals:   06/13/15 0813  BP: 117/61  Pulse: 89  Temp: 98.2 F (36.8 C)  Weight: 269 lb 3.2 oz (122.108 kg)    Fetal Status: Fetal Heart Rate (bpm): 151         General:  Alert, oriented and cooperative. Patient is in no acute distress.  Skin: Skin is warm and dry. No rash noted.   Cardiovascular: Normal heart rate noted  Respiratory: Normal respiratory effort, no problems with respiration noted  Abdomen: Soft, gravid, appropriate for gestational age. Pain/Pressure: Absent     Vaginal: Vag. Bleeding: None.       Extremities: Normal range of motion.  Edema: None  Mental Status: Normal mood and affect. Normal behavior. Normal judgment and thought content.   Urinalysis: Urine Protein: 1+ Urine Glucose: Negative FBS 117-120 2 hour pp 120-140 although no book today. Assessment and Plan:  Pregnancy: [id number] at [redacted]w[redacted]d  1. Diabetes mellitus during pregnancy, antepartum, second trimester  - Ambulatory referral to Pediatric Cardiology - insulin aspart (NOVOLOG FLEXPEN) 100 UNIT/ML FlexPen; Inject 22 Units into the skin 3 (three) times daily with meals.  Dispense: 15 mL; Refill: 11  2. Supervision of high risk pregnancy, antepartum, second trimester Continue routine prenatal care.  - Alpha fetoprotein, maternal - US OB Detail + 14 Wk; Future  3. Diabetes mellitus during pregnancy, antepartum, first trimester Increased her NPH  - insulin NPH Human (HUMULIN N,NOVOLIN N) 100 UNIT/ML injection; Take 28 units in the morning and 32 units  before bedtime.  Dispense: 10 mL; Refill: 11   Please refer to After Visit Summary for other counseling recommendations.  Return in 2 weeks (on 06/27/2015).   [name], MD

## 2015-06-13 NOTE — Progress Notes (Signed)
Ultrasound scheduled on 06/29/2015 @8 :00AM. Fetal echo scheduled with Dr Filbert Schilder on 07/21/2015 @ 10:00AM

## 2015-06-16 LAB — ALPHA FETOPROTEIN, MATERNAL
AFP: 18.6 ng/mL
CURR GEST AGE: 15.5 wks.days
MoM for AFP: 0.98
OPEN SPINA BIFIDA: NEGATIVE

## 2015-06-27 ENCOUNTER — Ambulatory Visit (INDEPENDENT_AMBULATORY_CARE_PROVIDER_SITE_OTHER): Payer: Self-pay | Admitting: Family Medicine

## 2015-06-27 VITALS — BP 138/73 | HR 100 | Wt 269.9 lb

## 2015-06-27 DIAGNOSIS — O3421 Maternal care for scar from previous cesarean delivery: Secondary | ICD-10-CM

## 2015-06-27 DIAGNOSIS — O34219 Maternal care for unspecified type scar from previous cesarean delivery: Secondary | ICD-10-CM

## 2015-06-27 DIAGNOSIS — O0992 Supervision of high risk pregnancy, unspecified, second trimester: Secondary | ICD-10-CM

## 2015-06-27 DIAGNOSIS — O24912 Unspecified diabetes mellitus in pregnancy, second trimester: Secondary | ICD-10-CM

## 2015-06-27 DIAGNOSIS — O24911 Unspecified diabetes mellitus in pregnancy, first trimester: Secondary | ICD-10-CM

## 2015-06-27 LAB — POCT URINALYSIS DIP (DEVICE)
GLUCOSE, UA: 500 mg/dL — AB
HGB URINE DIPSTICK: NEGATIVE
Ketones, ur: 80 mg/dL — AB
Leukocytes, UA: NEGATIVE
Nitrite: NEGATIVE
PROTEIN: 30 mg/dL — AB
Specific Gravity, Urine: >=1.03 (ref 1.005–1.030)
UROBILINOGEN UA: 0.2 mg/dL (ref 0.0–1.0)
pH: 5.5 (ref 5.0–8.0)

## 2015-06-27 MED ORDER — INSULIN ASPART 100 UNIT/ML FLEXPEN: 22.0000 [IU] | PEN_INJECTOR | Freq: Three times a day (TID) | SUBCUTANEOUS | Status: DC

## 2015-06-27 MED ORDER — INSULIN NPH (HUMAN) (ISOPHANE) 100 UNIT/ML ~~LOC~~ SUSP
SUBCUTANEOUS | Status: DC
Start: 2015-06-27 — End: 2015-07-11

## 2015-06-27 NOTE — Progress Notes (Signed)
Subjective:  Carmen Watts is a 34 y.o. [phone] at [redacted]w[redacted]d being seen today for ongoing prenatal care.  Patient reports no complaints.  Contractions: Not present.  Vag. Bleeding: None. Movement: Present. Denies leaking of fluid.   The following portions of the patient's history were reviewed and updated as appropriate: allergies, current medications, past family history, past medical history, past social history, past surgical history and problem list.   Objective:   Filed Vitals:   06/27/15 0912  BP: 138/73  Pulse: 100  Weight: 269 lb 14.4 oz (122.426 kg)    Fetal Status: Fetal Heart Rate (bpm): 142   Movement: Present     General:  Alert, oriented and cooperative. Patient is in no acute distress.  Skin: Skin is warm and dry. No rash noted.   Cardiovascular: Normal heart rate noted  Respiratory: Normal respiratory effort, no problems with respiration noted  Abdomen: Soft, gravid, appropriate for gestational age. Pain/Pressure: Absent     Pelvic: Vag. Bleeding: None      Extremities: Normal range of motion.  Edema: None  Mental Status: Normal mood and affect. Normal behavior. Normal judgment and thought content.   Urinalysis: Urine Protein: 1+ Urine Glucose: 3+  Assessment and Plan:  Pregnancy: [id number] at [redacted]w[redacted]d  1. Supervision of high risk pregnancy, antepartum, second trimester Continue routine prenatal care. For anatomy u/s this week.  2. Diabetes mellitus during pregnancy, antepartum, second trimester Increase her NPH to 30 and 35 at hs Increase Novolog to 24 u with dinner - insulin aspart (NOVOLOG FLEXPEN) 100 UNIT/ML FlexPen; Inject 22-24 Units into the skin 3 (three) times daily with meals. 22 units breakfast and lunch and 24 u dinner  Dispense: 15 mL; Refill: 11  3. Previous cesarean delivery, antepartum condition or complication Desires repeat and BTL  Please refer to After Visit Summary for other counseling recommendations.  Return in 2 weeks (on  07/11/2015).   [name], MD

## 2015-06-27 NOTE — Patient Instructions (Signed)
Second Trimester of Pregnancy The second trimester is from week 13 through week 28, months 4 through 6. The second trimester is often a time when you feel your best. Your body has also adjusted to being pregnant, and you begin to feel better physically. Usually, morning sickness has lessened or quit completely, you may have more energy, and you may have an increase in appetite. The second trimester is also a time when the fetus is growing rapidly. At the end of the sixth month, the fetus is about 9 inches long and weighs about 1 pounds. You will likely begin to feel the baby move (quickening) between 18 and 20 weeks of the pregnancy. BODY CHANGES Your body goes through many changes during pregnancy. The changes vary from woman to woman.   Your weight will continue to increase. You will notice your lower abdomen bulging out.  You may begin to get stretch marks on your hips, abdomen, and breasts.  You may develop headaches that can be relieved by medicines approved by your health care provider.  You may urinate more often because the fetus is pressing on your bladder.  You may develop or continue to have heartburn as a result of your pregnancy.  You may develop constipation because certain hormones are causing the muscles that push waste through your intestines to slow down.  You may develop hemorrhoids or swollen, bulging veins (varicose veins).  You may have back pain because of the weight gain and pregnancy hormones relaxing your joints between the bones in your pelvis and as a result of a shift in weight and the muscles that support your balance.  Your breasts will continue to grow and be tender.  Your gums may bleed and may be sensitive to brushing and flossing.  Dark spots or blotches (chloasma, mask of pregnancy) may develop on your face. This will likely fade after the baby is born.  A dark line from your belly button to the pubic area (linea nigra) may appear. This will likely  fade after the baby is born.  You may have changes in your hair. These can include thickening of your hair, rapid growth, and changes in texture. Some women also have hair loss during or after pregnancy, or hair that feels dry or thin. Your hair will most likely return to normal after your baby is born. WHAT TO EXPECT AT YOUR PRENATAL VISITS During a routine prenatal visit:  You will be weighed to make sure you and the fetus are growing normally.  Your blood pressure will be taken.  Your abdomen will be measured to track your baby's growth.  The fetal heartbeat will be listened to.  Any test results from the previous visit will be discussed. Your health care provider may ask you:  How you are feeling.  If you are feeling the baby move.  If you have had any abnormal symptoms, such as leaking fluid, bleeding, severe headaches, or abdominal cramping.  If you have any questions. Other tests that may be performed during your second trimester include:  Blood tests that check for:  Low iron levels (anemia).  Gestational diabetes (between 24 and 28 weeks).  Rh antibodies.  Urine tests to check for infections, diabetes, or protein in the urine.  An ultrasound to confirm the proper growth and development of the baby.  An amniocentesis to check for possible genetic problems.  Fetal screens for spina bifida and Down syndrome. HOME CARE INSTRUCTIONS   Avoid all smoking, herbs, alcohol, and unprescribed   drugs. These chemicals affect the formation and growth of the baby.  Follow your health care provider's instructions regarding medicine use. There are medicines that are either safe or unsafe to take during pregnancy.  Exercise only as directed by your health care provider. Experiencing uterine cramps is a good sign to stop exercising.  Continue to eat regular, healthy meals.  Wear a good support bra for breast tenderness.  Do not use hot tubs, steam rooms, or saunas.  Wear  your seat belt at all times when driving.  Avoid raw meat, uncooked cheese, cat litter boxes, and soil used by cats. These carry germs that can cause birth defects in the baby.  Take your prenatal vitamins.  Try taking a stool softener (if your health care provider approves) if you develop constipation. Eat more high-fiber foods, such as fresh vegetables or fruit and whole grains. Drink plenty of fluids to keep your urine clear or pale yellow.  Take warm sitz baths to soothe any pain or discomfort caused by hemorrhoids. Use hemorrhoid cream if your health care provider approves.  If you develop varicose veins, wear support hose. Elevate your feet for 15 minutes, 3-4 times a day. Limit salt in your diet.  Avoid heavy lifting, wear low heel shoes, and practice good posture.  Rest with your legs elevated if you have leg cramps or low back pain.  Visit your dentist if you have not gone yet during your pregnancy. Use a soft toothbrush to brush your teeth and be gentle when you floss.  A sexual relationship may be continued unless your health care provider directs you otherwise.  Continue to go to all your prenatal visits as directed by your health care provider. SEEK MEDICAL CARE IF:   You have dizziness.  You have mild pelvic cramps, pelvic pressure, or nagging pain in the abdominal area.  You have persistent nausea, vomiting, or diarrhea.  You have a bad smelling vaginal discharge.  You have pain with urination. SEEK IMMEDIATE MEDICAL CARE IF:   You have a fever.  You are leaking fluid from your vagina.  You have spotting or bleeding from your vagina.  You have severe abdominal cramping or pain.  You have rapid weight gain or loss.  You have shortness of breath with chest pain.  You notice sudden or extreme swelling of your face, hands, ankles, feet, or legs.  You have not felt your baby move in over an hour.  You have severe headaches that do not go away with  medicine.  You have vision changes. Document Released: 10/30/2001 Document Revised: 11/10/2013 Document Reviewed: 01/06/2013 [employer] Patient Information 2015 [employer]. This information is not intended to replace advice given to you by your health care provider. Make sure you discuss any questions you have with your health care provider.  Breastfeeding Deciding to breastfeed is one of the best choices you can make for you and your baby. A change in hormones during pregnancy causes your breast tissue to grow and increases the number and size of your milk ducts. These hormones also allow proteins, sugars, and fats from your blood supply to make breast milk in your milk-producing glands. Hormones prevent breast milk from being released before your baby is born as well as prompt milk flow after birth. Once breastfeeding has begun, thoughts of your baby, as well as his or her sucking or crying, can stimulate the release of milk from your milk-producing glands.  BENEFITS OF BREASTFEEDING For Your Baby  Your first   milk (colostrum) helps your baby's digestive system function better.   There are antibodies in your milk that help your baby fight off infections.   Your baby has a lower incidence of asthma, allergies, and sudden infant death syndrome.   The nutrients in breast milk are better for your baby than infant formulas and are designed uniquely for your baby's needs.   Breast milk improves your baby's brain development.   Your baby is less likely to develop other conditions, such as childhood obesity, asthma, or type 2 diabetes mellitus.  For You   Breastfeeding helps to create a very special bond between you and your baby.   Breastfeeding is convenient. Breast milk is always available at the correct temperature and costs nothing.   Breastfeeding helps to burn calories and helps you lose the weight gained during pregnancy.   Breastfeeding makes your uterus contract to its  prepregnancy size faster and slows bleeding (lochia) after you give birth.   Breastfeeding helps to lower your risk of developing type 2 diabetes mellitus, osteoporosis, and breast or ovarian cancer later in life. SIGNS THAT YOUR BABY IS HUNGRY Early Signs of Hunger  Increased alertness or activity.  Stretching.  Movement of the head from side to side.  Movement of the head and opening of the mouth when the corner of the mouth or cheek is stroked (rooting).  Increased sucking sounds, smacking lips, cooing, sighing, or squeaking.  Hand-to-mouth movements.  Increased sucking of fingers or hands. Late Signs of Hunger  Fussing.  Intermittent crying. Extreme Signs of Hunger Signs of extreme hunger will require calming and consoling before your baby will be able to breastfeed successfully. Do not wait for the following signs of extreme hunger to occur before you initiate breastfeeding:   Restlessness.  A loud, strong cry.   Screaming. BREASTFEEDING BASICS Breastfeeding Initiation  Find a comfortable place to sit or lie down, with your neck and back well supported.  Place a pillow or rolled up blanket under your baby to bring him or her to the level of your breast (if you are seated). Nursing pillows are specially designed to help support your arms and your baby while you breastfeed.  Make sure that your baby's abdomen is facing your abdomen.   Gently massage your breast. With your fingertips, massage from your chest wall toward your nipple in a circular motion. This encourages milk flow. You may need to continue this action during the feeding if your milk flows slowly.  Support your breast with 4 fingers underneath and your thumb above your nipple. Make sure your fingers are well away from your nipple and your baby's mouth.   Stroke your baby's lips gently with your finger or nipple.   When your baby's mouth is open wide enough, quickly bring your baby to your breast,  placing your entire nipple and as much of the colored area around your nipple (areola) as possible into your baby's mouth.   More areola should be visible above your baby's upper lip than below the lower lip.   Your baby's tongue should be between his or her lower gum and your breast.   Ensure that your baby's mouth is correctly positioned around your nipple (latched). Your baby's lips should create a seal on your breast and be turned out (everted).  It is common for your baby to suck about 2-3 minutes in order to start the flow of breast milk. Latching Teaching your baby how to latch on to your breast   properly is very important. An improper latch can cause nipple pain and decreased milk supply for you and poor weight gain in your baby. Also, if your baby is not latched onto your nipple properly, he or she may swallow some air during feeding. This can make your baby fussy. Burping your baby when you switch breasts during the feeding can help to get rid of the air. However, teaching your baby to latch on properly is still the best way to prevent fussiness from swallowing air while breastfeeding. Signs that your baby has successfully latched on to your nipple:    Silent tugging or silent sucking, without causing you pain.   Swallowing heard between every 3-4 sucks.    Muscle movement above and in front of his or her ears while sucking.  Signs that your baby has not successfully latched on to nipple:   Sucking sounds or smacking sounds from your baby while breastfeeding.  Nipple pain. If you think your baby has not latched on correctly, slip your finger into the corner of your baby's mouth to break the suction and place it between your baby's gums. Attempt breastfeeding initiation again. Signs of Successful Breastfeeding Signs from your baby:   A gradual decrease in the number of sucks or complete cessation of sucking.   Falling asleep.   Relaxation of his or her body.    Retention of a small amount of milk in his or her mouth.   Letting go of your breast by himself or herself. Signs from you:  Breasts that have increased in firmness, weight, and size 1-3 hours after feeding.   Breasts that are softer immediately after breastfeeding.  Increased milk volume, as well as a change in milk consistency and color by the fifth day of breastfeeding.   Nipples that are not sore, cracked, or bleeding. Signs That Your Baby is Getting Enough Milk  Wetting at least 3 diapers in a 24-hour period. The urine should be clear and pale yellow by age 5 days.  At least 3 stools in a 24-hour period by age 5 days. The stool should be soft and yellow.  At least 3 stools in a 24-hour period by age 7 days. The stool should be seedy and yellow.  No loss of weight greater than 10% of birth weight during the first 3 days of age.  Average weight gain of 4-7 ounces (113-198 g) per week after age 4 days.  Consistent daily weight gain by age 5 days, without weight loss after the age of 2 weeks. After a feeding, your baby may spit up a small amount. This is common. BREASTFEEDING FREQUENCY AND DURATION Frequent feeding will help you make more milk and can prevent sore nipples and breast engorgement. Breastfeed when you feel the need to reduce the fullness of your breasts or when your baby shows signs of hunger. This is called "breastfeeding on demand." Avoid introducing a pacifier to your baby while you are working to establish breastfeeding (the first 4-6 weeks after your baby is born). After this time you may choose to use a pacifier. Research has shown that pacifier use during the first year of a baby's life decreases the risk of sudden infant death syndrome (SIDS). Allow your baby to feed on each breast as long as he or she wants. Breastfeed until your baby is finished feeding. When your baby unlatches or falls asleep while feeding from the first breast, offer the second breast.  Because newborns are often sleepy in the   first few weeks of life, you may need to awaken your baby to get him or her to feed. Breastfeeding times will vary from baby to baby. However, the following rules can serve as a guide to help you ensure that your baby is properly fed:  Newborns (babies 4 weeks of age or younger) may breastfeed every 1-3 hours.  Newborns should not go longer than 3 hours during the day or 5 hours during the night without breastfeeding.  You should breastfeed your baby a minimum of 8 times in a 24-hour period until you begin to introduce solid foods to your baby at around 6 months of age. BREAST MILK PUMPING Pumping and storing breast milk allows you to ensure that your baby is exclusively fed your breast milk, even at times when you are unable to breastfeed. This is especially important if you are going back to work while you are still breastfeeding or when you are not able to be present during feedings. Your lactation consultant can give you guidelines on how long it is safe to store breast milk.  A breast pump is a machine that allows you to pump milk from your breast into a sterile bottle. The pumped breast milk can then be stored in a refrigerator or freezer. Some breast pumps are operated by hand, while others use electricity. Ask your lactation consultant which type will work best for you. Breast pumps can be purchased, but some hospitals and breastfeeding support groups lease breast pumps on a monthly basis. A lactation consultant can teach you how to hand express breast milk, if you prefer not to use a pump.  CARING FOR YOUR BREASTS WHILE YOU BREASTFEED Nipples can become dry, cracked, and sore while breastfeeding. The following recommendations can help keep your breasts moisturized and healthy:  Avoid using soap on your nipples.   Wear a supportive bra. Although not required, special nursing bras and tank tops are designed to allow access to your breasts for  breastfeeding without taking off your entire bra or top. Avoid wearing underwire-style bras or extremely tight bras.  Air dry your nipples for 3-4minutes after each feeding.   Use only cotton bra pads to absorb leaked breast milk. Leaking of breast milk between feedings is normal.   Use lanolin on your nipples after breastfeeding. Lanolin helps to maintain your skin's normal moisture barrier. If you use pure lanolin, you do not need to wash it off before feeding your baby again. Pure lanolin is not toxic to your baby. You may also hand express a few drops of breast milk and gently massage that milk into your nipples and allow the milk to air dry. In the first few weeks after giving birth, some women experience extremely full breasts (engorgement). Engorgement can make your breasts feel heavy, warm, and tender to the touch. Engorgement peaks within 3-5 days after you give birth. The following recommendations can help ease engorgement:  Completely empty your breasts while breastfeeding or pumping. You may want to start by applying warm, moist heat (in the shower or with warm water-soaked hand towels) just before feeding or pumping. This increases circulation and helps the milk flow. If your baby does not completely empty your breasts while breastfeeding, pump any extra milk after he or she is finished.  Wear a snug bra (nursing or regular) or tank top for 1-2 days to signal your body to slightly decrease milk production.  Apply ice packs to your breasts, unless this is too uncomfortable for you.    Make sure that your baby is latched on and positioned properly while breastfeeding. If engorgement persists after 48 hours of following these recommendations, contact your health care provider or a lactation consultant. OVERALL HEALTH CARE RECOMMENDATIONS WHILE BREASTFEEDING  Eat healthy foods. Alternate between meals and snacks, eating 3 of each per day. Because what you eat affects your breast milk,  some of the foods may make your baby more irritable than usual. Avoid eating these foods if you are sure that they are negatively affecting your baby.  Drink milk, fruit juice, and water to satisfy your thirst (about 10 glasses a day).   Rest often, relax, and continue to take your prenatal vitamins to prevent fatigue, stress, and anemia.  Continue breast self-awareness checks.  Avoid chewing and smoking tobacco.  Avoid alcohol and drug use. Some medicines that may be harmful to your baby can pass through breast milk. It is important to ask your health care provider before taking any medicine, including all over-the-counter and prescription medicine as well as vitamin and herbal supplements. It is possible to become pregnant while breastfeeding. If birth control is desired, ask your health care provider about options that will be safe for your baby. SEEK MEDICAL CARE IF:   You feel like you want to stop breastfeeding or have become frustrated with breastfeeding.  You have painful breasts or nipples.  Your nipples are cracked or bleeding.  Your breasts are red, tender, or warm.  You have a swollen area on either breast.  You have a fever or chills.  You have nausea or vomiting.  You have drainage other than breast milk from your nipples.  Your breasts do not become full before feedings by the fifth day after you give birth.  You feel sad and depressed.  Your baby is too sleepy to eat well.  Your baby is having trouble sleeping.   Your baby is wetting less than 3 diapers in a 24-hour period.  Your baby has less than 3 stools in a 24-hour period.  Your baby's skin or the white part of his or her eyes becomes yellow.   Your baby is not gaining weight by 5 days of age. SEEK IMMEDIATE MEDICAL CARE IF:   Your baby is overly tired (lethargic) and does not want to wake up and feed.  Your baby develops an unexplained fever. Document Released: 11/05/2005 Document Revised:  11/10/2013 Document Reviewed: 04/29/2013 [employer] Patient Information 2015 [employer]. This information is not intended to replace advice given to you by your health care provider. Make sure you discuss any questions you have with your health care provider.  

## 2015-06-27 NOTE — Progress Notes (Signed)
Reviewed Breastfeeding tip of the week regarding rooming in.

## 2015-06-28 ENCOUNTER — Other Ambulatory Visit: Payer: Self-pay | Admitting: Family Medicine

## 2015-06-28 DIAGNOSIS — O24319 Unspecified pre-existing diabetes mellitus in pregnancy, unspecified trimester: Secondary | ICD-10-CM

## 2015-06-28 DIAGNOSIS — Z3689 Encounter for other specified antenatal screening: Secondary | ICD-10-CM

## 2015-06-28 DIAGNOSIS — Z3A18 18 weeks gestation of pregnancy: Secondary | ICD-10-CM

## 2015-06-29 ENCOUNTER — Other Ambulatory Visit: Payer: Self-pay | Admitting: Family Medicine

## 2015-06-29 ENCOUNTER — Encounter (HOSPITAL_COMMUNITY): Payer: Self-pay

## 2015-06-29 ENCOUNTER — Ambulatory Visit (HOSPITAL_COMMUNITY)
Admission: RE | Admit: 2015-06-29 | Discharge: 2015-06-29 | Disposition: A | Discharge status: Home / Self Care | Payer: Medicaid Other | Source: Ambulatory Visit | Attending: Family Medicine | Admitting: Family Medicine

## 2015-06-29 VITALS — BP 123/55 | HR 89 | Wt 269.4 lb

## 2015-06-29 DIAGNOSIS — E669 Obesity, unspecified: Secondary | ICD-10-CM | POA: Diagnosis not present

## 2015-06-29 DIAGNOSIS — O99212 Obesity complicating pregnancy, second trimester: Secondary | ICD-10-CM | POA: Diagnosis not present

## 2015-06-29 DIAGNOSIS — O24319 Unspecified pre-existing diabetes mellitus in pregnancy, unspecified trimester: Secondary | ICD-10-CM

## 2015-06-29 DIAGNOSIS — O34219 Maternal care for unspecified type scar from previous cesarean delivery: Secondary | ICD-10-CM

## 2015-06-29 DIAGNOSIS — O24912 Unspecified diabetes mellitus in pregnancy, second trimester: Secondary | ICD-10-CM | POA: Diagnosis not present

## 2015-06-29 DIAGNOSIS — Z3A18 18 weeks gestation of pregnancy: Secondary | ICD-10-CM

## 2015-06-29 DIAGNOSIS — O3421 Maternal care for scar from previous cesarean delivery: Secondary | ICD-10-CM | POA: Insufficient documentation

## 2015-06-29 DIAGNOSIS — Z3689 Encounter for other specified antenatal screening: Secondary | ICD-10-CM

## 2015-06-29 DIAGNOSIS — Z36 Encounter for antenatal screening of mother: Secondary | ICD-10-CM | POA: Insufficient documentation

## 2015-06-29 DIAGNOSIS — O0992 Supervision of high risk pregnancy, unspecified, second trimester: Secondary | ICD-10-CM

## 2015-07-05 ENCOUNTER — Inpatient Hospital Stay (HOSPITAL_COMMUNITY)
Admission: AD | Admit: 2015-07-05 | Discharge: 2015-07-05 | Disposition: A | Discharge status: Home / Self Care | Payer: Medicaid Other | Source: Ambulatory Visit | Attending: Obstetrics and Gynecology | Admitting: Obstetrics and Gynecology

## 2015-07-05 ENCOUNTER — Encounter (HOSPITAL_COMMUNITY): Payer: Self-pay | Admitting: *Deleted

## 2015-07-05 DIAGNOSIS — O288 Other abnormal findings on antenatal screening of mother: Secondary | ICD-10-CM | POA: Insufficient documentation

## 2015-07-05 DIAGNOSIS — Z3A18 18 weeks gestation of pregnancy: Secondary | ICD-10-CM | POA: Diagnosis not present

## 2015-07-05 DIAGNOSIS — R682 Dry mouth, unspecified: Secondary | ICD-10-CM | POA: Insufficient documentation

## 2015-07-05 DIAGNOSIS — R824 Acetonuria: Secondary | ICD-10-CM

## 2015-07-05 DIAGNOSIS — R109 Unspecified abdominal pain: Secondary | ICD-10-CM | POA: Diagnosis present

## 2015-07-05 LAB — COMPREHENSIVE METABOLIC PANEL
ALT: 12 U/L — ABNORMAL LOW (ref 14–54)
AST: 12 U/L — ABNORMAL LOW (ref 15–41)
Albumin: 3.3 g/dL — ABNORMAL LOW (ref 3.5–5.0)
Alkaline Phosphatase: 45 U/L (ref 38–126)
Anion gap: 9 (ref 5–15)
BUN: 8 mg/dL (ref 6–20)
CO2: 22 mmol/L (ref 22–32)
Calcium: 9.1 mg/dL (ref 8.9–10.3)
Chloride: 103 mmol/L (ref 101–111)
Creatinine, Ser: 0.48 mg/dL (ref 0.44–1.00)
GFR calc Af Amer: >60 mL/min (ref >60)
GFR calc non Af Amer: >60 mL/min (ref >60)
Glucose, Bld: 130 mg/dL — ABNORMAL HIGH (ref 65–99)
Potassium: 3.7 mmol/L (ref 3.5–5.1)
Sodium: 134 mmol/L — ABNORMAL LOW (ref 135–145)
Total Bilirubin: 0.6 mg/dL (ref 0.3–1.2)
Total Protein: 7.3 g/dL (ref 6.5–8.1)

## 2015-07-05 LAB — URINALYSIS, ROUTINE W REFLEX MICROSCOPIC
Bilirubin Urine: NEGATIVE
GLUCOSE, UA: 100 mg/dL — AB
HGB URINE DIPSTICK: NEGATIVE
Ketones, ur: 15 mg/dL — AB
LEUKOCYTES UA: NEGATIVE
Nitrite: NEGATIVE
Protein, ur: NEGATIVE mg/dL
Urobilinogen, UA: 0.2 mg/dL (ref 0.0–1.0)
pH: 6 (ref 5.0–8.0)

## 2015-07-05 MED ORDER — SODIUM CHLORIDE 0.9 % IV SOLN
Freq: Once | INTRAVENOUS | Status: AC
  Administered 2015-07-05: 20:00:00 via INTRAVENOUS

## 2015-07-05 NOTE — MAU Note (Signed)
Pt. Is here complaining of dry mouth and stomach tightening that happens 2 times in an hour.  Doesn't really feel like a contractions just a tightening.  This started 3 days ago. Denies any bleeding or leaking of fluid.

## 2015-07-05 NOTE — MAU Provider Note (Signed)
History     CSN: [id number]  Arrival date and time: 07/05/15 6222   First Provider Initiated Contact with Patient 07/05/15 1908      Chief Complaint  Patient presents with  . Abdominal Pain   HPI  .Carmen Watts 34 y.o. [id number] @[redacted]w[redacted]d  presents to the MAU with the complaint of occassional upper abdominal cramping and dry mouth that has been happening for a couple of weeks. Denies nausea and vomiting, vaginal bleeding. LOF. Reports positive fetal movement.  Past Medical History  Diagnosis Date  . Asthma   . Ovarian cyst   . Gonorrhea   . Diabetes mellitus   . Chlamydia   . JHERDEYC(144.8)     Past Surgical History  Procedure Laterality Date  . Cesarean section      x3  . Therapeutic abortion      Family History  Problem Relation Age of Onset  . Thyroid disease Mother   . Diabetes Father   . Hypertension Father   . Ovarian cancer Maternal Grandmother   . Breast cancer Maternal Grandmother   . Cancer Maternal Grandmother   . Cancer Maternal Grandfather   . Lung cancer Maternal Grandfather   . Asthma Son     Social History  Substance Use Topics  . Smoking status: Never Smoker   . Smokeless tobacco: Never Used  . Alcohol Use: No     Comment: Socially before pregnancy    Allergies:  Allergies  Allergen Reactions  . Other Shortness Of Breath and Itching    Fresh fruit    Prescriptions prior to admission  Medication Sig Dispense Refill Last Dose  . ACCU-CHEK FASTCLIX LANCETS MISC Inject 1 each into the skin 4 (four) times daily. 648.83 for testing 4 times daily 102 each 12 Taking  . albuterol (PROVENTIL HFA;VENTOLIN HFA) 108 (90 BASE) MCG/ACT inhaler Inhale 2 puffs into the lungs every 6 (six) hours as needed. For asthma syptoms    Taking  . aspirin EC 81 MG tablet Take 1 tablet (81 mg total) by mouth daily. Start after [redacted] weeks gestation 90 tablet 3 Taking  . calcium carbonate (TUMS - DOSED IN MG ELEMENTAL CALCIUM) 500 MG chewable tablet Chew 1 tablet by  mouth daily.   Taking  . glucose blood (ACCU-CHEK ACTIVE STRIPS) test strip Use as instructed 1 each 12 Taking  . insulin aspart (NOVOLOG FLEXPEN) 100 UNIT/ML FlexPen Inject 22-24 Units into the skin 3 (three) times daily with meals. 22 units breakfast and lunch and 24 u dinner 15 mL 11   . insulin NPH Human (HUMULIN N,NOVOLIN N) 100 UNIT/ML injection Take 30 units in the morning and 35 units before bedtime. 10 mL 11   . Insulin Pen Needle (BD PEN NEEDLE NANO U/F) 32G X 4 MM MISC 1 applicator by Does not apply route 2 (two) times daily before lunch and supper. 30 each 11 Taking  . Insulin Syringe-Needle U-100 (INSULIN SYRINGE 1CC/31GX5/16") 31G X 5/16" 1 ML MISC 1 Syringe by Does not apply route 2 (two) times daily. 100 each 11 Taking   Results for orders placed or performed during the hospital encounter of 07/05/15 (from the past 24 hour(s))  Urinalysis, Routine w reflex microscopic (not at [facility])     Status: Abnormal   Collection Time: 07/05/15  6:35 PM  Result Value Ref Range   Color, Urine YELLOW YELLOW   APPearance CLEAR CLEAR   Specific Gravity, Urine >1.030 (H) 1.005 - 1.030   pH 6.0 5.0 - 8.0  Glucose, UA 100 (A) NEGATIVE mg/dL   Hgb urine dipstick NEGATIVE NEGATIVE   Bilirubin Urine NEGATIVE NEGATIVE   Ketones, ur 15 (A) NEGATIVE mg/dL   Protein, ur NEGATIVE NEGATIVE mg/dL   Urobilinogen, UA 0.2 0.0 - 1.0 mg/dL   Nitrite NEGATIVE NEGATIVE   Leukocytes, UA NEGATIVE NEGATIVE  Comprehensive metabolic panel     Status: Abnormal   Collection Time: 07/05/15  7:42 PM  Result Value Ref Range   Sodium 134 (L) 135 - 145 mmol/L   Potassium 3.7 3.5 - 5.1 mmol/L   Chloride 103 101 - 111 mmol/L   CO2 22 22 - 32 mmol/L   Glucose, Bld 130 (H) 65 - 99 mg/dL   BUN 8 6 - 20 mg/dL   Creatinine, Ser 0.48 0.44 - 1.00 mg/dL   Calcium 9.1 8.9 - 10.3 mg/dL   Total Protein 7.3 6.5 - 8.1 g/dL   Albumin 3.3 (L) 3.5 - 5.0 g/dL   AST 12 (L) 15 - 41 U/L   ALT 12 (L) 14 - 54 U/L   Alkaline Phosphatase  45 38 - 126 U/L   Total Bilirubin 0.6 0.3 - 1.2 mg/dL   GFR calc non Af Amer >60 >60 mL/min   GFR calc Af Amer >60 >60 mL/min   Anion gap 9 5 - 15    Review of Systems  Constitutional: Negative for fever.  HENT:       Dry mouth  Gastrointestinal: Positive for abdominal pain.  All other systems reviewed and are negative.  Physical Exam   Blood pressure 119/70, pulse 98, temperature 97.9 F (36.6 C), temperature source Oral, height 5\' 3"  (1.6 m), weight 121.621 kg (268 lb 2 oz), last menstrual period 01/02/2015, unknown if currently breastfeeding.  Physical Exam  Nursing note and vitals reviewed. Constitutional: She is oriented to person, place, and time. She appears well-developed and well-nourished. No distress.  HENT:  Head: Normocephalic.  Neck: Normal range of motion.  Cardiovascular: Normal rate.   Respiratory: Effort normal. No respiratory distress.  GI: Soft. There is no tenderness.  Neurological: She is alert and oriented to person, place, and time.  Skin: Skin is warm and dry.  Psychiatric: She has a normal mood and affect. Her behavior is normal. Judgment and thought content normal.    MAU Course  Procedures  MDM IV hydration; Pt is feeling better  Assessment and Plan  Ketonuria  Discharge to home  [facility] 07/05/2015, 7:18 PM

## 2015-07-05 NOTE — Discharge Instructions (Signed)

## 2015-07-11 ENCOUNTER — Ambulatory Visit (INDEPENDENT_AMBULATORY_CARE_PROVIDER_SITE_OTHER): Payer: Self-pay | Admitting: Family Medicine

## 2015-07-11 ENCOUNTER — Encounter: Payer: Self-pay | Admitting: Family Medicine

## 2015-07-11 VITALS — BP 134/67 | HR 104 | Temp 98.7°F | Wt 267.0 lb

## 2015-07-11 DIAGNOSIS — O0992 Supervision of high risk pregnancy, unspecified, second trimester: Secondary | ICD-10-CM

## 2015-07-11 DIAGNOSIS — O34219 Maternal care for unspecified type scar from previous cesarean delivery: Secondary | ICD-10-CM

## 2015-07-11 DIAGNOSIS — O3421 Maternal care for scar from previous cesarean delivery: Secondary | ICD-10-CM

## 2015-07-11 DIAGNOSIS — O24912 Unspecified diabetes mellitus in pregnancy, second trimester: Secondary | ICD-10-CM

## 2015-07-11 DIAGNOSIS — O24911 Unspecified diabetes mellitus in pregnancy, first trimester: Secondary | ICD-10-CM

## 2015-07-11 LAB — POCT URINALYSIS DIP (DEVICE)
Glucose, UA: NEGATIVE mg/dL
HGB URINE DIPSTICK: NEGATIVE
Ketones, ur: 80 mg/dL — AB
LEUKOCYTES UA: NEGATIVE
NITRITE: NEGATIVE
PH: 6 (ref 5.0–8.0)
PROTEIN: 30 mg/dL — AB
SPECIFIC GRAVITY, URINE: 1.025 (ref 1.005–1.030)
UROBILINOGEN UA: 0.2 mg/dL (ref 0.0–1.0)

## 2015-07-11 MED ORDER — INSULIN ASPART 100 UNIT/ML FLEXPEN: 24.0000 [IU] | PEN_INJECTOR | Freq: Three times a day (TID) | SUBCUTANEOUS | Status: DC

## 2015-07-11 MED ORDER — INSULIN NPH (HUMAN) (ISOPHANE) 100 UNIT/ML ~~LOC~~ SUSP: SUBCUTANEOUS | Status: DC

## 2015-07-11 NOTE — Patient Instructions (Signed)
Second Trimester of Pregnancy The second trimester is from week 13 through week 28, months 4 through 6. The second trimester is often a time when you feel your best. Your body has also adjusted to being pregnant, and you begin to feel better physically. Usually, morning sickness has lessened or quit completely, you may have more energy, and you may have an increase in appetite. The second trimester is also a time when the fetus is growing rapidly. At the end of the sixth month, the fetus is about 9 inches long and weighs about 1 pounds. You will likely begin to feel the baby move (quickening) between 18 and 20 weeks of the pregnancy. BODY CHANGES Your body goes through many changes during pregnancy. The changes vary from woman to woman.   Your weight will continue to increase. You will notice your lower abdomen bulging out.  You may begin to get stretch marks on your hips, abdomen, and breasts.  You may develop headaches that can be relieved by medicines approved by your health care provider.  You may urinate more often because the fetus is pressing on your bladder.  You may develop or continue to have heartburn as a result of your pregnancy.  You may develop constipation because certain hormones are causing the muscles that push waste through your intestines to slow down.  You may develop hemorrhoids or swollen, bulging veins (varicose veins).  You may have back pain because of the weight gain and pregnancy hormones relaxing your joints between the bones in your pelvis and as a result of a shift in weight and the muscles that support your balance.  Your breasts will continue to grow and be tender.  Your gums may bleed and may be sensitive to brushing and flossing.  Dark spots or blotches (chloasma, mask of pregnancy) may develop on your face. This will likely fade after the baby is born.  A dark line from your belly button to the pubic area (linea nigra) may appear. This will likely  fade after the baby is born.  You may have changes in your hair. These can include thickening of your hair, rapid growth, and changes in texture. Some women also have hair loss during or after pregnancy, or hair that feels dry or thin. Your hair will most likely return to normal after your baby is born. WHAT TO EXPECT AT YOUR PRENATAL VISITS During a routine prenatal visit:  You will be weighed to make sure you and the fetus are growing normally.  Your blood pressure will be taken.  Your abdomen will be measured to track your baby's growth.  The fetal heartbeat will be listened to.  Any test results from the previous visit will be discussed. Your health care provider may ask you:  How you are feeling.  If you are feeling the baby move.  If you have had any abnormal symptoms, such as leaking fluid, bleeding, severe headaches, or abdominal cramping.  If you have any questions. Other tests that may be performed during your second trimester include:  Blood tests that check for:  Low iron levels (anemia).  Gestational diabetes (between 24 and 28 weeks).  Rh antibodies.  Urine tests to check for infections, diabetes, or protein in the urine.  An ultrasound to confirm the proper growth and development of the baby.  An amniocentesis to check for possible genetic problems.  Fetal screens for spina bifida and Down syndrome. HOME CARE INSTRUCTIONS   Avoid all smoking, herbs, alcohol, and unprescribed   drugs. These chemicals affect the formation and growth of the baby.  Follow your health care provider's instructions regarding medicine use. There are medicines that are either safe or unsafe to take during pregnancy.  Exercise only as directed by your health care provider. Experiencing uterine cramps is a good sign to stop exercising.  Continue to eat regular, healthy meals.  Wear a good support bra for breast tenderness.  Do not use hot tubs, steam rooms, or saunas.  Wear  your seat belt at all times when driving.  Avoid raw meat, uncooked cheese, cat litter boxes, and soil used by cats. These carry germs that can cause birth defects in the baby.  Take your prenatal vitamins.  Try taking a stool softener (if your health care provider approves) if you develop constipation. Eat more high-fiber foods, such as fresh vegetables or fruit and whole grains. Drink plenty of fluids to keep your urine clear or pale yellow.  Take warm sitz baths to soothe any pain or discomfort caused by hemorrhoids. Use hemorrhoid cream if your health care provider approves.  If you develop varicose veins, wear support hose. Elevate your feet for 15 minutes, 3-4 times a day. Limit salt in your diet.  Avoid heavy lifting, wear low heel shoes, and practice good posture.  Rest with your legs elevated if you have leg cramps or low back pain.  Visit your dentist if you have not gone yet during your pregnancy. Use a soft toothbrush to brush your teeth and be gentle when you floss.  A sexual relationship may be continued unless your health care provider directs you otherwise.  Continue to go to all your prenatal visits as directed by your health care provider. SEEK MEDICAL CARE IF:   You have dizziness.  You have mild pelvic cramps, pelvic pressure, or nagging pain in the abdominal area.  You have persistent nausea, vomiting, or diarrhea.  You have a bad smelling vaginal discharge.  You have pain with urination. SEEK IMMEDIATE MEDICAL CARE IF:   You have a fever.  You are leaking fluid from your vagina.  You have spotting or bleeding from your vagina.  You have severe abdominal cramping or pain.  You have rapid weight gain or loss.  You have shortness of breath with chest pain.  You notice sudden or extreme swelling of your face, hands, ankles, feet, or legs.  You have not felt your baby move in over an hour.  You have severe headaches that do not go away with  medicine.  You have vision changes. Document Released: 10/30/2001 Document Revised: 11/10/2013 Document Reviewed: 01/06/2013 [employer] Patient Information 2015 [employer]. This information is not intended to replace advice given to you by your health care provider. Make sure you discuss any questions you have with your health care provider.  Breastfeeding Deciding to breastfeed is one of the best choices you can make for you and your baby. A change in hormones during pregnancy causes your breast tissue to grow and increases the number and size of your milk ducts. These hormones also allow proteins, sugars, and fats from your blood supply to make breast milk in your milk-producing glands. Hormones prevent breast milk from being released before your baby is born as well as prompt milk flow after birth. Once breastfeeding has begun, thoughts of your baby, as well as his or her sucking or crying, can stimulate the release of milk from your milk-producing glands.  BENEFITS OF BREASTFEEDING For Your Baby  Your first   milk (colostrum) helps your baby's digestive system function better.   There are antibodies in your milk that help your baby fight off infections.   Your baby has a lower incidence of asthma, allergies, and sudden infant death syndrome.   The nutrients in breast milk are better for your baby than infant formulas and are designed uniquely for your baby's needs.   Breast milk improves your baby's brain development.   Your baby is less likely to develop other conditions, such as childhood obesity, asthma, or type 2 diabetes mellitus.  For You   Breastfeeding helps to create a very special bond between you and your baby.   Breastfeeding is convenient. Breast milk is always available at the correct temperature and costs nothing.   Breastfeeding helps to burn calories and helps you lose the weight gained during pregnancy.   Breastfeeding makes your uterus contract to its  prepregnancy size faster and slows bleeding (lochia) after you give birth.   Breastfeeding helps to lower your risk of developing type 2 diabetes mellitus, osteoporosis, and breast or ovarian cancer later in life. SIGNS THAT YOUR BABY IS HUNGRY Early Signs of Hunger  Increased alertness or activity.  Stretching.  Movement of the head from side to side.  Movement of the head and opening of the mouth when the corner of the mouth or cheek is stroked (rooting).  Increased sucking sounds, smacking lips, cooing, sighing, or squeaking.  Hand-to-mouth movements.  Increased sucking of fingers or hands. Late Signs of Hunger  Fussing.  Intermittent crying. Extreme Signs of Hunger Signs of extreme hunger will require calming and consoling before your baby will be able to breastfeed successfully. Do not wait for the following signs of extreme hunger to occur before you initiate breastfeeding:   Restlessness.  A loud, strong cry.   Screaming. BREASTFEEDING BASICS Breastfeeding Initiation  Find a comfortable place to sit or lie down, with your neck and back well supported.  Place a pillow or rolled up blanket under your baby to bring him or her to the level of your breast (if you are seated). Nursing pillows are specially designed to help support your arms and your baby while you breastfeed.  Make sure that your baby's abdomen is facing your abdomen.   Gently massage your breast. With your fingertips, massage from your chest wall toward your nipple in a circular motion. This encourages milk flow. You may need to continue this action during the feeding if your milk flows slowly.  Support your breast with 4 fingers underneath and your thumb above your nipple. Make sure your fingers are well away from your nipple and your baby's mouth.   Stroke your baby's lips gently with your finger or nipple.   When your baby's mouth is open wide enough, quickly bring your baby to your breast,  placing your entire nipple and as much of the colored area around your nipple (areola) as possible into your baby's mouth.   More areola should be visible above your baby's upper lip than below the lower lip.   Your baby's tongue should be between his or her lower gum and your breast.   Ensure that your baby's mouth is correctly positioned around your nipple (latched). Your baby's lips should create a seal on your breast and be turned out (everted).  It is common for your baby to suck about 2-3 minutes in order to start the flow of breast milk. Latching Teaching your baby how to latch on to your breast   properly is very important. An improper latch can cause nipple pain and decreased milk supply for you and poor weight gain in your baby. Also, if your baby is not latched onto your nipple properly, he or she may swallow some air during feeding. This can make your baby fussy. Burping your baby when you switch breasts during the feeding can help to get rid of the air. However, teaching your baby to latch on properly is still the best way to prevent fussiness from swallowing air while breastfeeding. Signs that your baby has successfully latched on to your nipple:    Silent tugging or silent sucking, without causing you pain.   Swallowing heard between every 3-4 sucks.    Muscle movement above and in front of his or her ears while sucking.  Signs that your baby has not successfully latched on to nipple:   Sucking sounds or smacking sounds from your baby while breastfeeding.  Nipple pain. If you think your baby has not latched on correctly, slip your finger into the corner of your baby's mouth to break the suction and place it between your baby's gums. Attempt breastfeeding initiation again. Signs of Successful Breastfeeding Signs from your baby:   A gradual decrease in the number of sucks or complete cessation of sucking.   Falling asleep.   Relaxation of his or her body.    Retention of a small amount of milk in his or her mouth.   Letting go of your breast by himself or herself. Signs from you:  Breasts that have increased in firmness, weight, and size 1-3 hours after feeding.   Breasts that are softer immediately after breastfeeding.  Increased milk volume, as well as a change in milk consistency and color by the fifth day of breastfeeding.   Nipples that are not sore, cracked, or bleeding. Signs That Your Baby is Getting Enough Milk  Wetting at least 3 diapers in a 24-hour period. The urine should be clear and pale yellow by age 5 days.  At least 3 stools in a 24-hour period by age 5 days. The stool should be soft and yellow.  At least 3 stools in a 24-hour period by age 7 days. The stool should be seedy and yellow.  No loss of weight greater than 10% of birth weight during the first 3 days of age.  Average weight gain of 4-7 ounces (113-198 g) per week after age 4 days.  Consistent daily weight gain by age 5 days, without weight loss after the age of 2 weeks. After a feeding, your baby may spit up a small amount. This is common. BREASTFEEDING FREQUENCY AND DURATION Frequent feeding will help you make more milk and can prevent sore nipples and breast engorgement. Breastfeed when you feel the need to reduce the fullness of your breasts or when your baby shows signs of hunger. This is called "breastfeeding on demand." Avoid introducing a pacifier to your baby while you are working to establish breastfeeding (the first 4-6 weeks after your baby is born). After this time you may choose to use a pacifier. Research has shown that pacifier use during the first year of a baby's life decreases the risk of sudden infant death syndrome (SIDS). Allow your baby to feed on each breast as long as he or she wants. Breastfeed until your baby is finished feeding. When your baby unlatches or falls asleep while feeding from the first breast, offer the second breast.  Because newborns are often sleepy in the   first few weeks of life, you may need to awaken your baby to get him or her to feed. Breastfeeding times will vary from baby to baby. However, the following rules can serve as a guide to help you ensure that your baby is properly fed:  Newborns (babies 4 weeks of age or younger) may breastfeed every 1-3 hours.  Newborns should not go longer than 3 hours during the day or 5 hours during the night without breastfeeding.  You should breastfeed your baby a minimum of 8 times in a 24-hour period until you begin to introduce solid foods to your baby at around 6 months of age. BREAST MILK PUMPING Pumping and storing breast milk allows you to ensure that your baby is exclusively fed your breast milk, even at times when you are unable to breastfeed. This is especially important if you are going back to work while you are still breastfeeding or when you are not able to be present during feedings. Your lactation consultant can give you guidelines on how long it is safe to store breast milk.  A breast pump is a machine that allows you to pump milk from your breast into a sterile bottle. The pumped breast milk can then be stored in a refrigerator or freezer. Some breast pumps are operated by hand, while others use electricity. Ask your lactation consultant which type will work best for you. Breast pumps can be purchased, but some hospitals and breastfeeding support groups lease breast pumps on a monthly basis. A lactation consultant can teach you how to hand express breast milk, if you prefer not to use a pump.  CARING FOR YOUR BREASTS WHILE YOU BREASTFEED Nipples can become dry, cracked, and sore while breastfeeding. The following recommendations can help keep your breasts moisturized and healthy:  Avoid using soap on your nipples.   Wear a supportive bra. Although not required, special nursing bras and tank tops are designed to allow access to your breasts for  breastfeeding without taking off your entire bra or top. Avoid wearing underwire-style bras or extremely tight bras.  Air dry your nipples for 3-4minutes after each feeding.   Use only cotton bra pads to absorb leaked breast milk. Leaking of breast milk between feedings is normal.   Use lanolin on your nipples after breastfeeding. Lanolin helps to maintain your skin's normal moisture barrier. If you use pure lanolin, you do not need to wash it off before feeding your baby again. Pure lanolin is not toxic to your baby. You may also hand express a few drops of breast milk and gently massage that milk into your nipples and allow the milk to air dry. In the first few weeks after giving birth, some women experience extremely full breasts (engorgement). Engorgement can make your breasts feel heavy, warm, and tender to the touch. Engorgement peaks within 3-5 days after you give birth. The following recommendations can help ease engorgement:  Completely empty your breasts while breastfeeding or pumping. You may want to start by applying warm, moist heat (in the shower or with warm water-soaked hand towels) just before feeding or pumping. This increases circulation and helps the milk flow. If your baby does not completely empty your breasts while breastfeeding, pump any extra milk after he or she is finished.  Wear a snug bra (nursing or regular) or tank top for 1-2 days to signal your body to slightly decrease milk production.  Apply ice packs to your breasts, unless this is too uncomfortable for you.    Make sure that your baby is latched on and positioned properly while breastfeeding. If engorgement persists after 48 hours of following these recommendations, contact your health care provider or a lactation consultant. OVERALL HEALTH CARE RECOMMENDATIONS WHILE BREASTFEEDING  Eat healthy foods. Alternate between meals and snacks, eating 3 of each per day. Because what you eat affects your breast milk,  some of the foods may make your baby more irritable than usual. Avoid eating these foods if you are sure that they are negatively affecting your baby.  Drink milk, fruit juice, and water to satisfy your thirst (about 10 glasses a day).   Rest often, relax, and continue to take your prenatal vitamins to prevent fatigue, stress, and anemia.  Continue breast self-awareness checks.  Avoid chewing and smoking tobacco.  Avoid alcohol and drug use. Some medicines that may be harmful to your baby can pass through breast milk. It is important to ask your health care provider before taking any medicine, including all over-the-counter and prescription medicine as well as vitamin and herbal supplements. It is possible to become pregnant while breastfeeding. If birth control is desired, ask your health care provider about options that will be safe for your baby. SEEK MEDICAL CARE IF:   You feel like you want to stop breastfeeding or have become frustrated with breastfeeding.  You have painful breasts or nipples.  Your nipples are cracked or bleeding.  Your breasts are red, tender, or warm.  You have a swollen area on either breast.  You have a fever or chills.  You have nausea or vomiting.  You have drainage other than breast milk from your nipples.  Your breasts do not become full before feedings by the fifth day after you give birth.  You feel sad and depressed.  Your baby is too sleepy to eat well.  Your baby is having trouble sleeping.   Your baby is wetting less than 3 diapers in a 24-hour period.  Your baby has less than 3 stools in a 24-hour period.  Your baby's skin or the white part of his or her eyes becomes yellow.   Your baby is not gaining weight by 5 days of age. SEEK IMMEDIATE MEDICAL CARE IF:   Your baby is overly tired (lethargic) and does not want to wake up and feed.  Your baby develops an unexplained fever. Document Released: 11/05/2005 Document Revised:  11/10/2013 Document Reviewed: 04/29/2013 [employer] Patient Information 2015 [employer]. This information is not intended to replace advice given to you by your health care provider. Make sure you discuss any questions you have with your health care provider.  

## 2015-07-11 NOTE — Progress Notes (Signed)
Subjective:  Carmen Watts is a 34 y.o. [phone] at [redacted]w[redacted]d being seen today for ongoing prenatal care.  Patient reports no complaints.  Contractions: Irregular.  Vag. Bleeding: None. Movement: Present. Denies leaking of fluid.   The following portions of the patient's history were reviewed and updated as appropriate: allergies, current medications, past family history, past medical history, past social history, past surgical history and problem list.   Objective:   Filed Vitals:   07/11/15 0915  BP: 134/67  Pulse: 104  Temp: 98.7 F (37.1 C)  Weight: 267 lb (121.11 kg)    Fetal Status: Fetal Heart Rate (bpm): 141   Movement: Present     General:  Alert, oriented and cooperative. Patient is in no acute distress.  Skin: Skin is warm and dry. No rash noted.   Cardiovascular: Normal heart rate noted  Respiratory: Normal respiratory effort, no problems with respiration noted  Abdomen: Soft, gravid, appropriate for gestational age. Pain/Pressure: Absent     Pelvic: Vag. Bleeding: None     Cervical exam deferred        Extremities: Normal range of motion.     Mental Status: Normal mood and affect. Normal behavior. Normal judgment and thought content.   Urinalysis:     FBS 95-127 2 hr pp 104-140 Assessment and Plan:  Pregnancy: [id number] at [redacted]w[redacted]d  1. Supervision of high risk pregnancy, antepartum, second trimester Continue routine prenatal care. Normal anatomy-reviewed  2. Previous cesarean delivery, antepartum condition or complication For elective repeat  3. Diabetes mellitus during pregnancy, antepartum, second trimester Increased insulin further - insulin aspart (NOVOLOG FLEXPEN) 100 UNIT/ML FlexPen; Inject 24-26 Units into the skin 3 (three) times daily with meals. 24 units breakfast and lunch and 26 u dinner  Dispense: 15 mL; Refill: 11 - insulin NPH Human (HUMULIN N,NOVOLIN N) 100 UNIT/ML injection; Take 35 units in the morning and 39 units before bedtime.  Dispense: 10 mL;  Refill: 11   Please refer to After Visit Summary for other counseling recommendations.  Return in 2 weeks (on 07/25/2015).   [name], MD

## 2015-07-18 ENCOUNTER — Other Ambulatory Visit: Payer: Self-pay | Admitting: Family Medicine

## 2015-07-18 DIAGNOSIS — O34219 Maternal care for unspecified type scar from previous cesarean delivery: Secondary | ICD-10-CM

## 2015-07-18 DIAGNOSIS — O99212 Obesity complicating pregnancy, second trimester: Secondary | ICD-10-CM

## 2015-07-18 DIAGNOSIS — O24319 Unspecified pre-existing diabetes mellitus in pregnancy, unspecified trimester: Secondary | ICD-10-CM

## 2015-07-18 DIAGNOSIS — Z3A24 24 weeks gestation of pregnancy: Secondary | ICD-10-CM

## 2015-07-28 ENCOUNTER — Ambulatory Visit (INDEPENDENT_AMBULATORY_CARE_PROVIDER_SITE_OTHER): Payer: Self-pay | Admitting: Family

## 2015-07-28 VITALS — BP 120/57 | HR 79 | Temp 98.1°F | Wt 263.8 lb

## 2015-07-28 DIAGNOSIS — N898 Other specified noninflammatory disorders of vagina: Secondary | ICD-10-CM

## 2015-07-28 DIAGNOSIS — O0992 Supervision of high risk pregnancy, unspecified, second trimester: Secondary | ICD-10-CM

## 2015-07-28 DIAGNOSIS — O24912 Unspecified diabetes mellitus in pregnancy, second trimester: Secondary | ICD-10-CM

## 2015-07-28 LAB — POCT URINALYSIS DIP (DEVICE)
Glucose, UA: NEGATIVE mg/dL
HGB URINE DIPSTICK: NEGATIVE
KETONES UR: 40 mg/dL — AB
Leukocytes, UA: NEGATIVE
Nitrite: NEGATIVE
PH: 6 (ref 5.0–8.0)
Protein, ur: 30 mg/dL — AB
Specific Gravity, Urine: >=1.03 (ref 1.005–1.030)
Urobilinogen, UA: 0.2 mg/dL (ref 0.0–1.0)

## 2015-07-28 MED ORDER — TERCONAZOLE 0.4 % VA CREA: 1.0000 | TOPICAL_CREAM | Freq: Every day | VAGINAL | Status: DC

## 2015-07-28 MED ORDER — FLUCONAZOLE 150 MG PO TABS: 150.0000 mg | ORAL_TABLET | Freq: Once | ORAL | Status: DC

## 2015-07-28 NOTE — Progress Notes (Signed)
Subjective:  Carmen Watts is a 34 y.o. [phone] at [redacted]w[redacted]d being seen today for ongoing prenatal care.  Patient reports vaginal irritation.  Contractions: Not present.  Vag. Bleeding: None. Movement: Present. Denies leaking of fluid.   The following portions of the patient's history were reviewed and updated as appropriate: allergies, current medications, past family history, past medical history, past social history, past surgical history and problem list.   Objective:   Filed Vitals:   07/28/15 0923  BP: 120/57  Pulse: 79  Temp: 98.1 F (36.7 C)  Weight: 263 lb 12.8 oz (119.659 kg)    Fetal Status: Fetal Heart Rate (bpm): 139   Movement: Present     General:  Alert, oriented and cooperative. Patient is in no acute distress.  Skin: Skin is warm and dry. No rash noted.   Cardiovascular: Normal heart rate noted  Respiratory: Normal respiratory effort, no problems with respiration noted  Abdomen: Soft, gravid, appropriate for gestational age. Pain/Pressure: Absent     Pelvic: Vag. Bleeding: None Vag D/C Character: Other (Comment)   white discharge        Extremities: Normal range of motion.  Edema: Trace  Mental Status: Normal mood and affect. Normal behavior. Normal judgment and thought content.   Pt did not bring CBG log; reports CBGs decreased since last visit, PM CBGs the highest >140's  Urinalysis: Urine Protein: 1+ Urine Glucose: Negative  Assessment and Plan:  Pregnancy: [id number] at [redacted]w[redacted]d  1. Diabetes mellitus during pregnancy, antepartum, second trimester - Emphasized importance of bringing log to next visit  2. Vaginal discharge - Wet prep, genital - RX Terazol; Diflucan x 1 (pt aware of potential risks)  Preterm labor symptoms and general obstetric precautions including but not limited to vaginal bleeding, contractions, leaking of fluid and fetal movement were reviewed in detail with the patient. Please refer to After Visit Summary for other counseling recommendations.   Return in about 2 weeks (around 08/11/2015).   Venia Carbon Michiel Cowboy, CNM

## 2015-07-28 NOTE — Progress Notes (Signed)
Edema- ankles     Vaginal discharge- white, curdy, and itchy Declined flu vaccine

## 2015-07-29 LAB — WET PREP, GENITAL
Trich, Wet Prep: NONE SEEN
WBC WET PREP: NONE SEEN

## 2015-08-05 ENCOUNTER — Encounter: Payer: Self-pay | Admitting: *Deleted

## 2015-08-08 ENCOUNTER — Encounter: Payer: Medicaid Other | Admitting: Obstetrics and Gynecology

## 2015-08-15 ENCOUNTER — Ambulatory Visit (INDEPENDENT_AMBULATORY_CARE_PROVIDER_SITE_OTHER): Payer: Medicaid Other | Admitting: Family Medicine

## 2015-08-15 ENCOUNTER — Ambulatory Visit (HOSPITAL_COMMUNITY)
Admission: RE | Admit: 2015-08-15 | Discharge: 2015-08-15 | Disposition: A | Discharge status: Home / Self Care | Payer: Medicaid Other | Source: Ambulatory Visit | Attending: Family Medicine | Admitting: Family Medicine

## 2015-08-15 ENCOUNTER — Encounter (HOSPITAL_COMMUNITY): Payer: Self-pay

## 2015-08-15 VITALS — BP 120/69 | HR 84 | Wt 262.6 lb

## 2015-08-15 VITALS — BP 133/63 | HR 101 | Wt 263.0 lb

## 2015-08-15 DIAGNOSIS — E119 Type 2 diabetes mellitus without complications: Secondary | ICD-10-CM | POA: Insufficient documentation

## 2015-08-15 DIAGNOSIS — O24912 Unspecified diabetes mellitus in pregnancy, second trimester: Secondary | ICD-10-CM

## 2015-08-15 DIAGNOSIS — O24319 Unspecified pre-existing diabetes mellitus in pregnancy, unspecified trimester: Secondary | ICD-10-CM | POA: Insufficient documentation

## 2015-08-15 DIAGNOSIS — Z3A24 24 weeks gestation of pregnancy: Secondary | ICD-10-CM | POA: Diagnosis not present

## 2015-08-15 DIAGNOSIS — E669 Obesity, unspecified: Secondary | ICD-10-CM | POA: Diagnosis not present

## 2015-08-15 DIAGNOSIS — O3421 Maternal care for scar from previous cesarean delivery: Secondary | ICD-10-CM | POA: Insufficient documentation

## 2015-08-15 DIAGNOSIS — O0992 Supervision of high risk pregnancy, unspecified, second trimester: Secondary | ICD-10-CM

## 2015-08-15 DIAGNOSIS — O99212 Obesity complicating pregnancy, second trimester: Secondary | ICD-10-CM | POA: Insufficient documentation

## 2015-08-15 DIAGNOSIS — O34219 Maternal care for unspecified type scar from previous cesarean delivery: Secondary | ICD-10-CM

## 2015-08-15 LAB — POCT URINALYSIS DIP (DEVICE)
Glucose, UA: NEGATIVE mg/dL
Hgb urine dipstick: NEGATIVE
KETONES UR: 15 mg/dL — AB
Leukocytes, UA: NEGATIVE
NITRITE: NEGATIVE
PH: 5.5 (ref 5.0–8.0)
PROTEIN: NEGATIVE mg/dL
Specific Gravity, Urine: 1.02 (ref 1.005–1.030)
UROBILINOGEN UA: 0.2 mg/dL (ref 0.0–1.0)

## 2015-08-15 MED ORDER — GLUCOSE BLOOD VI STRP: ORAL_STRIP | Status: AC

## 2015-08-15 MED ORDER — ACCU-CHEK FASTCLIX LANCETS MISC: 1.0000 | Freq: Four times a day (QID) | Status: DC

## 2015-08-15 NOTE — Patient Instructions (Signed)
Second Trimester of Pregnancy The second trimester is from week 13 through week 28, months 4 through 6. The second trimester is often a time when you feel your best. Your body has also adjusted to being pregnant, and you begin to feel better physically. Usually, morning sickness has lessened or quit completely, you may have more energy, and you may have an increase in appetite. The second trimester is also a time when the fetus is growing rapidly. At the end of the sixth month, the fetus is about 9 inches long and weighs about 1 pounds. You will likely begin to feel the baby move (quickening) between 18 and 20 weeks of the pregnancy. BODY CHANGES Your body goes through many changes during pregnancy. The changes vary from woman to woman.   Your weight will continue to increase. You will notice your lower abdomen bulging out.  You may begin to get stretch marks on your hips, abdomen, and breasts.  You may develop headaches that can be relieved by medicines approved by your health care provider.  You may urinate more often because the fetus is pressing on your bladder.  You may develop or continue to have heartburn as a result of your pregnancy.  You may develop constipation because certain hormones are causing the muscles that push waste through your intestines to slow down.  You may develop hemorrhoids or swollen, bulging veins (varicose veins).  You may have back pain because of the weight gain and pregnancy hormones relaxing your joints between the bones in your pelvis and as a result of a shift in weight and the muscles that support your balance.  Your breasts will continue to grow and be tender.  Your gums may bleed and may be sensitive to brushing and flossing.  Dark spots or blotches (chloasma, mask of pregnancy) may develop on your face. This will likely fade after the baby is born.  A dark line from your belly button to the pubic area (linea nigra) may appear. This will likely fade  after the baby is born.  You may have changes in your hair. These can include thickening of your hair, rapid growth, and changes in texture. Some women also have hair loss during or after pregnancy, or hair that feels dry or thin. Your hair will most likely return to normal after your baby is born. WHAT TO EXPECT AT YOUR PRENATAL VISITS During a routine prenatal visit:  You will be weighed to make sure you and the fetus are growing normally.  Your blood pressure will be taken.  Your abdomen will be measured to track your baby's growth.  The fetal heartbeat will be listened to.  Any test results from the previous visit will be discussed. Your health care provider may ask you:  How you are feeling.  If you are feeling the baby move.  If you have had any abnormal symptoms, such as leaking fluid, bleeding, severe headaches, or abdominal cramping.  If you have any questions. Other tests that may be performed during your second trimester include:  Blood tests that check for:  Low iron levels (anemia).  Gestational diabetes (between 24 and 28 weeks).  Rh antibodies.  Urine tests to check for infections, diabetes, or protein in the urine.  An ultrasound to confirm the proper growth and development of the baby.  An amniocentesis to check for possible genetic problems.  Fetal screens for spina bifida and Down syndrome. HOME CARE INSTRUCTIONS   Avoid all smoking, herbs, alcohol, and unprescribed   drugs. These chemicals affect the formation and growth of the baby.  Follow your health care provider's instructions regarding medicine use. There are medicines that are either safe or unsafe to take during pregnancy.  Exercise only as directed by your health care provider. Experiencing uterine cramps is a good sign to stop exercising.  Continue to eat regular, healthy meals.  Wear a good support bra for breast tenderness.  Do not use hot tubs, steam rooms, or saunas.  Wear your  seat belt at all times when driving.  Avoid raw meat, uncooked cheese, cat litter boxes, and soil used by cats. These carry germs that can cause birth defects in the baby.  Take your prenatal vitamins.  Try taking a stool softener (if your health care provider approves) if you develop constipation. Eat more high-fiber foods, such as fresh vegetables or fruit and whole grains. Drink plenty of fluids to keep your urine clear or pale yellow.  Take warm sitz baths to soothe any pain or discomfort caused by hemorrhoids. Use hemorrhoid cream if your health care provider approves.  If you develop varicose veins, wear support hose. Elevate your feet for 15 minutes, 3-4 times a day. Limit salt in your diet.  Avoid heavy lifting, wear low heel shoes, and practice good posture.  Rest with your legs elevated if you have leg cramps or low back pain.  Visit your dentist if you have not gone yet during your pregnancy. Use a soft toothbrush to brush your teeth and be gentle when you floss.  A sexual relationship may be continued unless your health care provider directs you otherwise.  Continue to go to all your prenatal visits as directed by your health care provider. SEEK MEDICAL CARE IF:   You have dizziness.  You have mild pelvic cramps, pelvic pressure, or nagging pain in the abdominal area.  You have persistent nausea, vomiting, or diarrhea.  You have a bad smelling vaginal discharge.  You have pain with urination. SEEK IMMEDIATE MEDICAL CARE IF:   You have a fever.  You are leaking fluid from your vagina.  You have spotting or bleeding from your vagina.  You have severe abdominal cramping or pain.  You have rapid weight gain or loss.  You have shortness of breath with chest pain.  You notice sudden or extreme swelling of your face, hands, ankles, feet, or legs.  You have not felt your baby move in over an hour.  You have severe headaches that do not go away with  medicine.  You have vision changes. Document Released: 10/30/2001 Document Revised: 11/10/2013 Document Reviewed: 01/06/2013 Exit[employer] Patient Information 2015 [employer]. This information is not intended to replace advice given to you by your health care provider. Make sure you discuss any questions you have with your health care provider.  

## 2015-08-15 NOTE — Progress Notes (Signed)
Subjective:  Carmen Watts is a 34 y.o. [phone] at [redacted]w[redacted]d being seen today for ongoing prenatal care.  Patient reports no complaints.  Contractions: Not present.  Vag. Bleeding: None. Movement: Present. Denies leaking of fluid.   Current regimen:  nph 35, 39 novolog 24  fasting 100, 99, 101, 105 100, 98, 120, 116 B 107-115 L 125-136 D 123-152  The following portions of the patient's history were reviewed and updated as appropriate: allergies, current medications, past family history, past medical history, past social history, past surgical history and problem list.   Objective:   Filed Vitals:   08/15/15 1117  BP: 120/69  Pulse: 84  Weight: 262 lb 9.6 oz (119.115 kg)    Fetal Status: Fetal Heart Rate (bpm): 135   Movement: Present     General:  Alert, oriented and cooperative. Patient is in no acute distress.  Skin: Skin is warm and dry. No rash noted.   Cardiovascular: Normal heart rate noted  Respiratory: Normal respiratory effort, no problems with respiration noted  Abdomen: Soft, gravid, appropriate for gestational age. Pain/Pressure: Absent     Pelvic: Vag. Bleeding: None     Cervical exam deferred        Extremities: Normal range of motion.     Mental Status: Normal mood and affect. Normal behavior. Normal judgment and thought content.   Urinalysis:      Assessment and Plan:  Pregnancy: [id number] at [redacted]w[redacted]d  1. Diabetes mellitus during pregnancy, antepartum, second trimester -Will increase to 26 U novolog with dinner to target pp and fasting - RTC in 2 weeks - Considering increasing NPH  2. Supervision of high risk pregnancy, antepartum, second trimester Updated pregnancy box  3. Previous cesarean delivery, antepartum condition or complication -Desires rCS. Strongly desires BTS, signed consent and is media tab  Preterm labor symptoms and general obstetric precautions including but not limited to vaginal bleeding, contractions, leaking of fluid and fetal movement  were reviewed in detail with the patient. Please refer to After Visit Summary for other counseling recommendations.  Return in about 4 weeks (around 09/12/2015) for Routine prenatal care.   [name], MD

## 2015-08-29 ENCOUNTER — Ambulatory Visit (INDEPENDENT_AMBULATORY_CARE_PROVIDER_SITE_OTHER): Payer: Medicaid Other | Admitting: Obstetrics & Gynecology

## 2015-08-29 VITALS — BP 115/64 | HR 103 | Temp 98.7°F | Wt 260.0 lb

## 2015-08-29 DIAGNOSIS — B373 Candidiasis of vulva and vagina: Secondary | ICD-10-CM | POA: Diagnosis not present

## 2015-08-29 DIAGNOSIS — O24912 Unspecified diabetes mellitus in pregnancy, second trimester: Secondary | ICD-10-CM

## 2015-08-29 DIAGNOSIS — O98812 Other maternal infectious and parasitic diseases complicating pregnancy, second trimester: Secondary | ICD-10-CM | POA: Diagnosis not present

## 2015-08-29 DIAGNOSIS — B372 Candidiasis of skin and nail: Secondary | ICD-10-CM

## 2015-08-29 LAB — POCT URINALYSIS DIP (DEVICE)
Glucose, UA: NEGATIVE mg/dL
HGB URINE DIPSTICK: NEGATIVE
KETONES UR: NEGATIVE mg/dL
Nitrite: NEGATIVE
PH: 5.5 (ref 5.0–8.0)
PROTEIN: 30 mg/dL — AB
Specific Gravity, Urine: 1.025 (ref 1.005–1.030)
Urobilinogen, UA: 1 mg/dL (ref 0.0–1.0)

## 2015-08-29 MED ORDER — INSULIN NPH (HUMAN) (ISOPHANE) 100 UNIT/ML ~~LOC~~ SUSP: SUBCUTANEOUS | Status: DC

## 2015-08-29 MED ORDER — NYSTATIN 100000 UNIT/GM EX CREA: 1.0000 "application " | TOPICAL_CREAM | Freq: Two times a day (BID) | CUTANEOUS | Status: DC

## 2015-08-29 NOTE — Progress Notes (Signed)
Subjective:  Carmen Watts is a 34 y.o. [phone] at [redacted]w[redacted]d being seen today for ongoing prenatal care.  Patient reports vulvar irritation secondary to recently diagnosed vulvovaginal candidiasis; wants topical therapy for her vulva.  Contractions: Irritability.  Vag. Bleeding: None. Movement: Present. Denies leaking of fluid.   The following portions of the patient's history were reviewed and updated as appropriate: allergies, current medications, past family history, past medical history, past social history, past surgical history and problem list.   Objective:   Filed Vitals:   08/29/15 0822  BP: 115/64  Pulse: 103  Temp: 98.7 F (37.1 C)  Weight: 260 lb (117.935 kg)    Fetal Status: Fetal Heart Rate (bpm): 130 Fundal Height: 28 cm Movement: Present     General:  Alert, oriented and cooperative. Patient is in no acute distress.  Skin: Skin is warm and dry. No rash noted.   Cardiovascular: Normal heart rate noted  Respiratory: Normal respiratory effort, no problems with respiration noted  Abdomen: Soft, gravid, appropriate for gestational age. Pain/Pressure: Absent     Pelvic: Vag. Bleeding: None     Cervical exam deferred        Extremities: Normal range of motion.  Edema: None  Mental Status: Normal mood and affect. Normal behavior. Normal judgment and thought content.   Urinalysis: Urine Protein: 1+ Urine Glucose: Negative CBGs: Forgot log book, reported fastings 90-101 (this morning), PP within limit  Assessment and Plan:  Pregnancy: [id number] at [redacted]w[redacted]d  1. Diabetes mellitus during pregnancy, antepartum, second trimester Increased nighttime NPH from 39 to 40 units; will evaluate CBGs at next visit - insulin NPH Human (HUMULIN N,NOVOLIN N) 100 UNIT/ML injection; Take 35 units in the morning and 40 units before bedtime.  Dispense: 10 mL; Refill: 11  2. Yeast infection of the skin - nystatin cream (MYCOSTATIN); Apply 1 application topically 2 (two) times daily.  Dispense: 30 g;  Refill: 3  Preterm labor symptoms and general obstetric precautions including but not limited to vaginal bleeding, contractions, leaking of fluid and fetal movement were reviewed in detail with the patient. Please refer to After Visit Summary for other counseling recommendations.  Return in about 2 weeks (around 09/12/2015) for OB Visit, TDap, 3rd trimester labs (except GTT).   Osborne Oman, MD

## 2015-08-29 NOTE — Patient Instructions (Addendum)
Return to clinic for any obstetric concerns or go to [facility] for evaluation Tdap Vaccine (Tetanus, Diphtheria and Pertussis): What You Need to Know 1. Why get vaccinated? Tetanus, diphtheria and pertussis are very serious diseases. Tdap vaccine can protect [place] from these diseases. And, Tdap vaccine given to pregnant women can protect newborn babies against pertussis. TETANUS (Lockjaw) is rare in the [place] today. It causes painful muscle tightening and stiffness, usually all over the body.  It can lead to tightening of muscles in the head and neck so you can't open your mouth, swallow, or sometimes even breathe. Tetanus kills about 1 out of 10 people who are infected even after receiving the best medical care. DIPHTHERIA is also rare in the [place] today. It can cause a thick coating to form in the back of the throat.  It can lead to breathing problems, heart failure, paralysis, and death. PERTUSSIS (Whooping Cough) causes severe coughing spells, which can cause difficulty breathing, vomiting and disturbed sleep.  It can also lead to weight loss, incontinence, and rib fractures. Up to 2 in 100 adolescents and 5 in 100 adults with pertussis are hospitalized or have complications, which could include pneumonia or death. These diseases are caused by bacteria. Diphtheria and pertussis are spread from person to person through secretions from coughing or sneezing. Tetanus enters the body through cuts, scratches, or wounds. Before vaccines, as many as 200,000 cases of diphtheria, 200,000 cases of pertussis, and hundreds of cases of tetanus, were reported in the [place] each year. Since vaccination began, reports of cases for tetanus and diphtheria have dropped by about 99% and for pertussis by about 80%. 2. Tdap vaccine Tdap vaccine can protect adolescents and adults from tetanus, diphtheria, and pertussis. One dose of Tdap is routinely given at age 89 or 73. People who did not get Tdap at  that age should get it as soon as possible. Tdap is especially important for healthcare professionals and anyone having close contact with a baby younger than 12 months. Pregnant women should get a dose of Tdap during every pregnancy, to protect the newborn from pertussis. Infants are most at risk for severe, life-threatening complications from pertussis. Another vaccine, called Td, protects against tetanus and diphtheria, but not pertussis. A Td booster should be given every 10 years. Tdap may be given as one of these boosters if you have never gotten Tdap before. Tdap may also be given after a severe cut or burn to prevent tetanus infection. Your doctor or the person giving you the vaccine can give you more information. Tdap may safely be given at the same time as other vaccines. 3. Some people should not get this vaccine  A person who has ever had a life-threatening allergic reaction after a previous dose of any diphtheria, tetanus or pertussis containing vaccine, OR has a severe allergy to any part of this vaccine, should not get Tdap vaccine. Tell the person giving the vaccine about any severe allergies.  Anyone who had coma or long repeated seizures within 7 days after a childhood dose of DTP or DTaP, or a previous dose of Tdap, should not get Tdap, unless a cause other than the vaccine was found. They can still get Td.  Talk to your doctor if you:  have seizures or another nervous system problem,  had severe pain or swelling after any vaccine containing diphtheria, tetanus or pertussis,  ever had a condition called Guillain-Barr Syndrome (GBS),  aren't feeling well on the day  the shot is scheduled. 4. Risks With any medicine, including vaccines, there is a chance of side effects. These are usually mild and go away on their own. Serious reactions are also possible but are rare. Most people who get Tdap vaccine do not have any problems with it. Mild problems following Tdap (Did not  interfere with activities)  Pain where the shot was given (about 3 in 4 adolescents or 2 in 3 adults)  Redness or swelling where the shot was given (about 1 person in 5)  Mild fever of at least 100.42F (up to about 1 in 25 adolescents or 1 in 100 adults)  Headache (about 3 or 4 people in 10)  Tiredness (about 1 person in 3 or 4)  Nausea, vomiting, diarrhea, stomach ache (up to 1 in 4 adolescents or 1 in 10 adults)  Chills, sore joints (about 1 person in 10)  Body aches (about 1 person in 3 or 4)  Rash, swollen glands (uncommon) Moderate problems following Tdap (Interfered with activities, but did not require medical attention)  Pain where the shot was given (up to 1 in 5 or 6)  Redness or swelling where the shot was given (up to about 1 in 16 adolescents or 1 in 12 adults)  Fever over 102F (about 1 in 100 adolescents or 1 in 250 adults)  Headache (about 1 in 7 adolescents or 1 in 10 adults)  Nausea, vomiting, diarrhea, stomach ache (up to 1 or 3 people in 100)  Swelling of the entire arm where the shot was given (up to about 1 in 500). Severe problems following Tdap (Unable to perform usual activities; required medical attention)  Swelling, severe pain, bleeding and redness in the arm where the shot was given (rare). Problems that could happen after any vaccine:  People sometimes faint after a medical procedure, including vaccination. Sitting or lying down for about 15 minutes can help prevent fainting, and injuries caused by a fall. Tell your doctor if you feel dizzy, or have vision changes or ringing in the ears.  Some people get severe pain in the shoulder and have difficulty moving the arm where a shot was given. This happens very rarely.  Any medication can cause a severe allergic reaction. Such reactions from a vaccine are very rare, estimated at fewer than 1 in a million doses, and would happen within a few minutes to a few hours after the vaccination. As with any  medicine, there is a very remote chance of a vaccine causing a serious injury or death. The safety of vaccines is always being monitored. For more information, visit: http://www.aguilar.org/ 5. What if there is a serious problem? What should I look for?  Look for anything that concerns you, such as signs of a severe allergic reaction, very high fever, or unusual behavior.  Signs of a severe allergic reaction can include hives, swelling of the face and throat, difficulty breathing, a fast heartbeat, dizziness, and weakness. These would usually start a few minutes to a few hours after the vaccination. What should I do?  If you think it is a severe allergic reaction or other emergency that can't wait, call 9-1-1 or get the person to the nearest hospital. Otherwise, call your doctor.  Afterward, the reaction should be reported to the Vaccine Adverse Event Reporting System (VAERS). Your doctor might file this report, or you can do it yourself through the VAERS web site at www.vaers.SamedayNews.es, or by calling 204-500-4113. VAERS does not give medical advice.  6. The National Vaccine Injury Compensation Program The [employer] Vaccine Injury Compensation Program (VICP) is a federal program that was created to compensate people who may have been injured by certain vaccines. Persons who believe they may have been injured by a vaccine can learn about the program and about filing a claim by calling 320-804-7887 or visiting the [employer] website at GoldCloset.com.ee. There is a time limit to file a claim for compensation. 7. How can I learn more?  Ask your doctor. He or she can give you the vaccine package insert or suggest other sources of information.  Call your local or state health department.  Contact the Centers for Disease Control and Prevention (CDC):  Call (602)481-0410 (1-800-CDC-INFO) or  Visit CDC's website at http://hunter.com/ CDC Tdap Vaccine VIS (01/12/14)   This  information is not intended to replace advice given to you by your health care provider. Make sure you discuss any questions you have with your health care provider.   Document Released: 05/06/2012 Document Revised: 11/26/2014 Document Reviewed: 02/17/2014 Elsevier Interactive Patient Education [employer].

## 2015-08-29 NOTE — Progress Notes (Signed)
Reviewed tip of week with patient  

## 2015-09-12 ENCOUNTER — Ambulatory Visit (INDEPENDENT_AMBULATORY_CARE_PROVIDER_SITE_OTHER): Payer: Medicaid Other | Admitting: Obstetrics and Gynecology

## 2015-09-12 ENCOUNTER — Other Ambulatory Visit (HOSPITAL_COMMUNITY): Payer: Self-pay | Admitting: Maternal and Fetal Medicine

## 2015-09-12 ENCOUNTER — Ambulatory Visit (HOSPITAL_COMMUNITY)
Admission: RE | Admit: 2015-09-12 | Discharge: 2015-09-12 | Disposition: A | Discharge status: Home / Self Care | Payer: Medicaid Other | Source: Ambulatory Visit | Attending: Obstetrics and Gynecology | Admitting: Obstetrics and Gynecology

## 2015-09-12 VITALS — BP 123/65 | HR 92 | Wt 261.0 lb

## 2015-09-12 VITALS — BP 122/48 | HR 100 | Temp 98.3°F | Wt 260.0 lb

## 2015-09-12 DIAGNOSIS — O99212 Obesity complicating pregnancy, second trimester: Secondary | ICD-10-CM

## 2015-09-12 DIAGNOSIS — E669 Obesity, unspecified: Secondary | ICD-10-CM | POA: Insufficient documentation

## 2015-09-12 DIAGNOSIS — O24912 Unspecified diabetes mellitus in pregnancy, second trimester: Secondary | ICD-10-CM

## 2015-09-12 DIAGNOSIS — O0990 Supervision of high risk pregnancy, unspecified, unspecified trimester: Secondary | ICD-10-CM

## 2015-09-12 DIAGNOSIS — Z3A28 28 weeks gestation of pregnancy: Secondary | ICD-10-CM | POA: Insufficient documentation

## 2015-09-12 DIAGNOSIS — O24913 Unspecified diabetes mellitus in pregnancy, third trimester: Secondary | ICD-10-CM

## 2015-09-12 DIAGNOSIS — O34219 Maternal care for unspecified type scar from previous cesarean delivery: Secondary | ICD-10-CM | POA: Diagnosis not present

## 2015-09-12 DIAGNOSIS — Z23 Encounter for immunization: Secondary | ICD-10-CM

## 2015-09-12 LAB — POCT URINALYSIS DIP (DEVICE)
BILIRUBIN URINE: NEGATIVE
Glucose, UA: 250 mg/dL — AB
HGB URINE DIPSTICK: NEGATIVE
KETONES UR: NEGATIVE mg/dL
LEUKOCYTES UA: NEGATIVE
Nitrite: NEGATIVE
PH: 6 (ref 5.0–8.0)
Protein, ur: NEGATIVE mg/dL
SPECIFIC GRAVITY, URINE: 1.02 (ref 1.005–1.030)
Urobilinogen, UA: 1 mg/dL (ref 0.0–1.0)

## 2015-09-12 LAB — CBC
HEMATOCRIT: 33.5 % — AB (ref 36.0–46.0)
HEMOGLOBIN: 11.2 g/dL — AB (ref 12.0–15.0)
MCH: 26.7 pg (ref 26.0–34.0)
MCHC: 33.4 g/dL (ref 30.0–36.0)
MCV: 79.8 fL (ref 78.0–100.0)
MPV: 9.9 fL (ref 8.6–12.4)
Platelets: 308 10*3/uL (ref 150–400)
RBC: 4.2 MIL/uL (ref 3.87–5.11)
RDW: 14.7 % (ref 11.5–15.5)
WBC: 9.1 10*3/uL (ref 4.0–10.5)

## 2015-09-12 NOTE — Progress Notes (Signed)
Subjective:  Carmen Watts is a 34 y.o. [phone] at [redacted]w[redacted]d being seen today for ongoing prenatal care.  Fastings 90-112. 2-hour PPs 110s to 120s. Nothing over 130. Having contractions for 5 days. Off and on, as frequent as every 20 minutes. No vaginal bleeding or discharge. Sugar 91 this morning. No leakage of fluid. .  Contractions: Regular.  Vag. Bleeding: None. Movement: Present. Denies leaking of fluid.   The following portions of the patient's history were reviewed and updated as appropriate: allergies, current medications, past family history, past medical history, past social history, past surgical history and problem list. Problem list updated.  Objective:   Filed Vitals:   09/12/15 0824  BP: 122/48  Pulse: 100  Temp: 98.3 F (36.8 C)  Weight: 260 lb (117.935 kg)    Fetal Status: Fetal Heart Rate (bpm): 141 Fundal Height: 31 cm Movement: Present     General:  Alert, oriented and cooperative. Patient is in no acute distress.  Skin: Skin is warm and dry. No rash noted.   Cardiovascular: Normal heart rate noted  Respiratory: Normal respiratory effort, no problems with respiration noted  Abdomen: Soft, gravid, appropriate for gestational age. Pain/Pressure: Present     Pelvic: Vag. Bleeding: None Vag D/C Character: Other (Comment)   Cervical exam performed Dilation: Closed Effacement (%): 0 Station: Ballotable  Extremities: Normal range of motion.  Edema: Trace  Mental Status: Normal mood and affect. Normal behavior. Normal judgment and thought content.   Urinalysis:      Assessment and Plan:  Pregnancy: [id number] at [redacted]w[redacted]d  1. Need for Tdap vaccination - Tdap vaccine greater than or equal to 7yo IM  # BDM - didn't bring log! Pleaded to do so in all future visits - patient's recollection shows relative good control but need to increase meds. Will increase nph by 2 units bid and regular by 1 unit with each meal. Telephonic f/u with our DM educator if out of goal. Discussed goal  and gave hypoglycemia return precautions - fetal echo normal per pt, results yet to be received  # pregnancy - cbc, hiv, rpr today  # ctxns - cervix closed today, likely braxton hicks, no symptoms pprom - labor and pprom return precautions discussed - push fluids, etc.  Preterm labor symptoms and general obstetric precautions including but not limited to vaginal bleeding, contractions, leaking of fluid and fetal movement were reviewed in detail with the patient. Please refer to After Visit Summary for other counseling recommendations.  Return in about 2 weeks (around 09/26/2015).   Gwynne Edinger, MD

## 2015-09-13 ENCOUNTER — Other Ambulatory Visit (HOSPITAL_COMMUNITY): Payer: Self-pay | Admitting: Obstetrics and Gynecology

## 2015-09-13 LAB — HIV ANTIBODY (ROUTINE TESTING W REFLEX): HIV 1&2 Ab, 4th Generation: NONREACTIVE

## 2015-09-13 LAB — RPR

## 2015-09-26 ENCOUNTER — Ambulatory Visit (INDEPENDENT_AMBULATORY_CARE_PROVIDER_SITE_OTHER): Payer: Medicaid Other | Admitting: Family Medicine

## 2015-09-26 VITALS — BP 103/70 | HR 113 | Temp 98.3°F | Wt 260.3 lb

## 2015-09-26 DIAGNOSIS — O34219 Maternal care for unspecified type scar from previous cesarean delivery: Secondary | ICD-10-CM | POA: Diagnosis not present

## 2015-09-26 DIAGNOSIS — O24913 Unspecified diabetes mellitus in pregnancy, third trimester: Secondary | ICD-10-CM | POA: Diagnosis present

## 2015-09-26 DIAGNOSIS — M792 Neuralgia and neuritis, unspecified: Secondary | ICD-10-CM

## 2015-09-26 DIAGNOSIS — O0990 Supervision of high risk pregnancy, unspecified, unspecified trimester: Secondary | ICD-10-CM

## 2015-09-26 MED ORDER — ACCU-CHEK NANO SMARTVIEW W/DEVICE KIT
1.0000 [IU] | PACK | Freq: Three times a day (TID) | Status: AC
Start: 2015-09-26 — End: ?

## 2015-09-26 NOTE — Progress Notes (Signed)
Subjective:  Carmen Watts is a 34 y.o. [phone] at [redacted]w[redacted]d being seen today for ongoing prenatal care.  Patient reports no complaints.  Contractions: Irregular.  Vag. Bleeding: None. Movement: Present. Denies leaking of fluid.   The following portions of the patient's history were reviewed and updated as appropriate: allergies, current medications, past family history, past medical history, past social history, past surgical history and problem list. Problem list updated.  Objective:   Filed Vitals:   09/26/15 0828  BP: 103/70  Pulse: 113  Temp: 98.3 F (36.8 C)  Weight: 260 lb 4.8 oz (118.071 kg)    Fetal Status: Fetal Heart Rate (bpm): 140   Movement: Present     General:  Alert, oriented and cooperative. Patient is in no acute distress.  Skin: Skin is warm and dry. No rash noted.   Cardiovascular: Normal heart rate noted  Respiratory: Normal respiratory effort, no problems with respiration noted  Abdomen: Soft, gravid, appropriate for gestational age. Pain/Pressure: Present     Pelvic: Vag. Bleeding: None Vag D/C Character: Watery   Cervical exam deferred        Extremities: Normal range of motion.  Edema: Trace  Mental Status: Normal mood and affect. Normal behavior. Normal judgment and thought content.   Urinalysis:      Blood sugars Fasting 98-102 2 hr pp 105-135 Dinner pp are 129-135 (consistently 130s x 2 weeks)  Assessment and Plan:  Pregnancy: [id number] at [redacted]w[redacted]d  1. Diabetes mellitus during pregnancy, antepartum, third trimester -  NPH  35/40 units,  Novolog 24-26u currently and we will change to  Novolog 24 26 28  to target prandial blood sugars -  Has has all necessary testing to date -  ASA 81mg  -  Follow up growth is scheduled for 11/21 - Start NST x2 weekly at next visit  2. Neuropathic pain -controlled  3. Previous cesarean delivery, antepartum condition or complication -Will need repeat @39  weeks (prefers 11/25/2015 if possible), sent message to scheduler.    4. Supervision of high risk pregnancy, antepartum, unspecified trimester -updated pregnancy box  Preterm labor symptoms and general obstetric precautions including but not limited to vaginal bleeding, contractions, leaking of fluid and fetal movement were reviewed in detail with the patient. Please refer to After Visit Summary for other counseling recommendations.   Return in about 2 weeks (around 10/10/2015) for Routine prenatal care, will need NST same day and start 2xweekly testing.   [name], MD

## 2015-09-26 NOTE — Patient Instructions (Addendum)
Increase Novolog to 24 un with breakfast, 26 with lunch, 28 with dinner.  Go back to previous regimen if blood sugars are < 60   Increase calories 100-200 per meal to help with weight gain  Third Trimester of Pregnancy The third trimester is from week 29 through week 42, months 7 through 9. The third trimester is a time when the fetus is growing rapidly. At the end of the ninth month, the fetus is about 20 inches in length and weighs 6-10 pounds.  BODY CHANGES Your body goes through many changes during pregnancy. The changes vary from woman to woman.   Your weight will continue to increase. You can expect to gain 25-35 pounds (11-16 kg) by the end of the pregnancy.  You may begin to get stretch marks on your hips, abdomen, and breasts.  You may urinate more often because the fetus is moving lower into your pelvis and pressing on your bladder.  You may develop or continue to have heartburn as a result of your pregnancy.  You may develop constipation because certain hormones are causing the muscles that push waste through your intestines to slow down.  You may develop hemorrhoids or swollen, bulging veins (varicose veins).  You may have pelvic pain because of the weight gain and pregnancy hormones relaxing your joints between the bones in your pelvis. Backaches may result from overexertion of the muscles supporting your posture.  You may have changes in your hair. These can include thickening of your hair, rapid growth, and changes in texture. Some women also have hair loss during or after pregnancy, or hair that feels dry or thin. Your hair will most likely return to normal after your baby is born.  Your breasts will continue to grow and be tender. A yellow discharge may leak from your breasts called colostrum.  Your belly button may stick out.  You may feel short of breath because of your expanding uterus.  You may notice the fetus "dropping," or moving lower in your abdomen.  You  may have a bloody mucus discharge. This usually occurs a few days to a week before labor begins.  Your cervix becomes thin and soft (effaced) near your due date. WHAT TO EXPECT AT YOUR PRENATAL EXAMS  You will have prenatal exams every 2 weeks until week 36. Then, you will have weekly prenatal exams. During a routine prenatal visit:  You will be weighed to make sure you and the fetus are growing normally.  Your blood pressure is taken.  Your abdomen will be measured to track your baby's growth.  The fetal heartbeat will be listened to.  Any test results from the previous visit will be discussed.  You may have a cervical check near your due date to see if you have effaced. At around 36 weeks, your caregiver will check your cervix. At the same time, your caregiver will also perform a test on the secretions of the vaginal tissue. This test is to determine if a type of bacteria, Group B streptococcus, is present. Your caregiver will explain this further. Your caregiver may ask you:  What your birth plan is.  How you are feeling.  If you are feeling the baby move.  If you have had any abnormal symptoms, such as leaking fluid, bleeding, severe headaches, or abdominal cramping.  If you are using any tobacco products, including cigarettes, chewing tobacco, and electronic cigarettes.  If you have any questions. Other tests or screenings that may be performed during  your third trimester include:  Blood tests that check for low iron levels (anemia).  Fetal testing to check the health, activity level, and growth of the fetus. Testing is done if you have certain medical conditions or if there are problems during the pregnancy.  HIV (human immunodeficiency virus) testing. If you are at high risk, you may be screened for HIV during your third trimester of pregnancy. FALSE LABOR You may feel small, irregular contractions that eventually go away. These are called Braxton Hicks contractions, or  false labor. Contractions may last for hours, days, or even weeks before true labor sets in. If contractions come at regular intervals, intensify, or become painful, it is best to be seen by your caregiver.  SIGNS OF LABOR   Menstrual-like cramps.  Contractions that are 5 minutes apart or less.  Contractions that start on the top of the uterus and spread down to the lower abdomen and back.  A sense of increased pelvic pressure or back pain.  A watery or bloody mucus discharge that comes from the vagina. If you have any of these signs before the 37th week of pregnancy, call your caregiver right away. You need to go to the hospital to get checked immediately. HOME CARE INSTRUCTIONS   Avoid all smoking, herbs, alcohol, and unprescribed drugs. These chemicals affect the formation and growth of the baby.  Do not use any tobacco products, including cigarettes, chewing tobacco, and electronic cigarettes. If you need help quitting, ask your health care provider. You may receive counseling support and other resources to help you quit.  Follow your caregiver's instructions regarding medicine use. There are medicines that are either safe or unsafe to take during pregnancy.  Exercise only as directed by your caregiver. Experiencing uterine cramps is a good sign to stop exercising.  Continue to eat regular, healthy meals.  Wear a good support bra for breast tenderness.  Do not use hot tubs, steam rooms, or saunas.  Wear your seat belt at all times when driving.  Avoid raw meat, uncooked cheese, cat litter boxes, and soil used by cats. These carry germs that can cause birth defects in the baby.  Take your prenatal vitamins.  Take 1500-2000 mg of calcium daily starting at the 20th week of pregnancy until you deliver your baby.  Try taking a stool softener (if your caregiver approves) if you develop constipation. Eat more high-fiber foods, such as fresh vegetables or fruit and whole grains.  Drink plenty of fluids to keep your urine clear or pale yellow.  Take warm sitz baths to soothe any pain or discomfort caused by hemorrhoids. Use hemorrhoid cream if your caregiver approves.  If you develop varicose veins, wear support hose. Elevate your feet for 15 minutes, 3-4 times a day. Limit salt in your diet.  Avoid heavy lifting, wear low heal shoes, and practice good posture.  Rest a lot with your legs elevated if you have leg cramps or low back pain.  Visit your dentist if you have not gone during your pregnancy. Use a soft toothbrush to brush your teeth and be gentle when you floss.  A sexual relationship may be continued unless your caregiver directs you otherwise.  Do not travel far distances unless it is absolutely necessary and only with the approval of your caregiver.  Take prenatal classes to understand, practice, and ask questions about the labor and delivery.  Make a trial run to the hospital.  Pack your hospital bag.  Prepare the baby's nursery.  Continue to go to all your prenatal visits as directed by your caregiver. SEEK MEDICAL CARE IF:  You are unsure if you are in labor or if your water has broken.  You have dizziness.  You have mild pelvic cramps, pelvic pressure, or nagging pain in your abdominal area.  You have persistent nausea, vomiting, or diarrhea.  You have a bad smelling vaginal discharge.  You have pain with urination. SEEK IMMEDIATE MEDICAL CARE IF:   You have a fever.  You are leaking fluid from your vagina.  You have spotting or bleeding from your vagina.  You have severe abdominal cramping or pain.  You have rapid weight loss or gain.  You have shortness of breath with chest pain.  You notice sudden or extreme swelling of your face, hands, ankles, feet, or legs.  You have not felt your baby move in over an hour.  You have severe headaches that do not go away with medicine.  You have vision changes.   This information  is not intended to replace advice given to you by your health care provider. Make sure you discuss any questions you have with your health care provider.   Document Released: 10/30/2001 Document Revised: 11/26/2014 Document Reviewed: 01/06/2013 Elsevier Interactive Patient Education [employer].

## 2015-09-26 NOTE — Progress Notes (Signed)
No urine specimen given by patient today.

## 2015-09-29 ENCOUNTER — Encounter (HOSPITAL_COMMUNITY): Payer: Self-pay | Admitting: *Deleted

## 2015-10-03 ENCOUNTER — Encounter: Payer: Self-pay | Admitting: Family Medicine

## 2015-10-10 ENCOUNTER — Encounter (HOSPITAL_COMMUNITY): Payer: Self-pay

## 2015-10-10 ENCOUNTER — Ambulatory Visit (INDEPENDENT_AMBULATORY_CARE_PROVIDER_SITE_OTHER): Payer: Medicaid Other | Admitting: Obstetrics and Gynecology

## 2015-10-10 ENCOUNTER — Ambulatory Visit (HOSPITAL_COMMUNITY)
Admission: RE | Admit: 2015-10-10 | Discharge: 2015-10-10 | Disposition: A | Discharge status: Home / Self Care | Payer: Medicaid Other | Source: Ambulatory Visit | Attending: Obstetrics and Gynecology | Admitting: Obstetrics and Gynecology

## 2015-10-10 ENCOUNTER — Other Ambulatory Visit (HOSPITAL_COMMUNITY): Payer: Self-pay | Admitting: Maternal and Fetal Medicine

## 2015-10-10 VITALS — BP 120/63 | HR 91 | Wt 257.8 lb

## 2015-10-10 VITALS — BP 119/71 | HR 112 | Wt 255.6 lb

## 2015-10-10 DIAGNOSIS — Z3A32 32 weeks gestation of pregnancy: Secondary | ICD-10-CM | POA: Insufficient documentation

## 2015-10-10 DIAGNOSIS — O99213 Obesity complicating pregnancy, third trimester: Secondary | ICD-10-CM | POA: Diagnosis not present

## 2015-10-10 DIAGNOSIS — O24913 Unspecified diabetes mellitus in pregnancy, third trimester: Secondary | ICD-10-CM

## 2015-10-10 DIAGNOSIS — O0993 Supervision of high risk pregnancy, unspecified, third trimester: Secondary | ICD-10-CM

## 2015-10-10 DIAGNOSIS — O24919 Unspecified diabetes mellitus in pregnancy, unspecified trimester: Secondary | ICD-10-CM

## 2015-10-10 DIAGNOSIS — O34219 Maternal care for unspecified type scar from previous cesarean delivery: Secondary | ICD-10-CM | POA: Insufficient documentation

## 2015-10-10 DIAGNOSIS — O24113 Pre-existing diabetes mellitus, type 2, in pregnancy, third trimester: Secondary | ICD-10-CM | POA: Diagnosis present

## 2015-10-10 LAB — POCT URINALYSIS DIP (DEVICE)
Bilirubin Urine: NEGATIVE
GLUCOSE, UA: NEGATIVE mg/dL
Hgb urine dipstick: NEGATIVE
Ketones, ur: 80 mg/dL — AB
NITRITE: NEGATIVE
PROTEIN: NEGATIVE mg/dL
SPECIFIC GRAVITY, URINE: 1.02 (ref 1.005–1.030)
UROBILINOGEN UA: 1 mg/dL (ref 0.0–1.0)
pH: 6 (ref 5.0–8.0)

## 2015-10-10 NOTE — Progress Notes (Signed)
Breastfeeding tip of the week reviewed.   [place] for growth today

## 2015-10-10 NOTE — Progress Notes (Signed)
Subjective:  Carmen Watts is a 34 y.o. [phone] at [redacted]w[redacted]d being seen today for ongoing prenatal care.  She is currently monitored for the following issues for this high-risk pregnancy: Patient Active Problem List   Diagnosis Date Noted  . Diabetes mellitus during pregnancy, antepartum 04/26/2014    Priority: Medium  . Supervision of high risk pregnancy, antepartum 04/26/2014    Priority: Medium  . Previous cesarean delivery, antepartum condition or complication [id number]    Priority: Medium  . RUQ pain 07/28/2012  . 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 Neuropathic pain 10/17/2011   Patient reports no complaints.  Contractions: Irregular. Vag. Bleeding: None.  Movement: Present. Denies leaking of fluid.   The following portions of the patient's history were reviewed and updated as appropriate: allergies, current medications, past family history, past medical history, past social history, past surgical history and problem list. Problem list updated.  Objective:   Filed Vitals:   10/10/15 0808  BP: 119/71  Pulse: 112  Weight: 255 lb 9.6 oz (115.939 kg)    Fetal Status:     Movement: Present     General:  Alert, oriented and cooperative. Patient is in no acute distress.  Skin: Skin is warm and dry. No rash noted.   Cardiovascular: Normal heart rate noted  Respiratory: Normal respiratory effort, no problems with respiration noted  Abdomen: Soft, gravid, appropriate for gestational age. Pain/Pressure: Present     Pelvic: Vag. Bleeding: None     Cervical exam deferred        Extremities: Normal range of motion.  Edema: None  Mental Status: Normal mood and affect. Normal behavior. Normal judgment and thought content.   Urinalysis:      Assessment and Plan:  Pregnancy: [id number] at [redacted]w[redacted]d  1. Diabetes mellitus during pregnancy, antepartum, third trimester CBGs reviewed and all elevated with the exception of after breakfast. Will increase NPH to 37 in am and 42 in  evening Follow up growth ultrasound today - Fetal nonstress test= NST reviewed and reactive  2. Previous cesarean delivery, antepartum condition or complication Scheduled for repeat on 1/6 at [redacted]w[redacted]d per patient request  3. Supervision of high risk pregnancy, antepartum, third trimester   Preterm labor symptoms and general obstetric precautions including but not limited to vaginal bleeding, contractions, leaking of fluid and fetal movement were reviewed in detail with the patient. Please refer to After Visit Summary for other counseling recommendations.  No Follow-up on file.   Mora Bellman, MD

## 2015-10-12 ENCOUNTER — Ambulatory Visit (INDEPENDENT_AMBULATORY_CARE_PROVIDER_SITE_OTHER): Payer: Medicaid Other | Admitting: *Deleted

## 2015-10-12 DIAGNOSIS — O409XX0 Polyhydramnios, unspecified trimester, not applicable or unspecified: Secondary | ICD-10-CM | POA: Insufficient documentation

## 2015-10-12 DIAGNOSIS — O24913 Unspecified diabetes mellitus in pregnancy, third trimester: Secondary | ICD-10-CM | POA: Diagnosis not present

## 2015-10-12 NOTE — Progress Notes (Signed)
NST reviewed and reactive.  

## 2015-10-12 NOTE — Progress Notes (Signed)
Labor precautions reviewed 

## 2015-10-17 ENCOUNTER — Ambulatory Visit (INDEPENDENT_AMBULATORY_CARE_PROVIDER_SITE_OTHER): Payer: Medicaid Other | Admitting: Obstetrics & Gynecology

## 2015-10-17 ENCOUNTER — Encounter: Payer: Self-pay | Admitting: Obstetrics & Gynecology

## 2015-10-17 ENCOUNTER — Encounter (HOSPITAL_COMMUNITY): Payer: Self-pay | Admitting: *Deleted

## 2015-10-17 ENCOUNTER — Inpatient Hospital Stay (HOSPITAL_COMMUNITY)
Admission: AD | Admit: 2015-10-17 | Discharge: 2015-10-17 | Disposition: A | Discharge status: Home / Self Care | Payer: Medicaid Other | Source: Ambulatory Visit | Attending: Family Medicine | Admitting: Family Medicine

## 2015-10-17 VITALS — BP 135/74 | HR 102 | Wt 259.5 lb

## 2015-10-17 DIAGNOSIS — O24113 Pre-existing diabetes mellitus, type 2, in pregnancy, third trimester: Secondary | ICD-10-CM | POA: Insufficient documentation

## 2015-10-17 DIAGNOSIS — Z794 Long term (current) use of insulin: Secondary | ICD-10-CM | POA: Diagnosis not present

## 2015-10-17 DIAGNOSIS — O24913 Unspecified diabetes mellitus in pregnancy, third trimester: Secondary | ICD-10-CM | POA: Diagnosis not present

## 2015-10-17 DIAGNOSIS — E119 Type 2 diabetes mellitus without complications: Secondary | ICD-10-CM | POA: Insufficient documentation

## 2015-10-17 DIAGNOSIS — O4703 False labor before 37 completed weeks of gestation, third trimester: Secondary | ICD-10-CM

## 2015-10-17 DIAGNOSIS — Z3A33 33 weeks gestation of pregnancy: Secondary | ICD-10-CM | POA: Insufficient documentation

## 2015-10-17 DIAGNOSIS — O403XX1 Polyhydramnios, third trimester, fetus 1: Secondary | ICD-10-CM | POA: Diagnosis not present

## 2015-10-17 LAB — FETAL FIBRONECTIN: FETAL FIBRONECTIN: NEGATIVE

## 2015-10-17 LAB — POCT URINALYSIS DIP (DEVICE)
Glucose, UA: 100 mg/dL — AB
HGB URINE DIPSTICK: NEGATIVE
LEUKOCYTES UA: NEGATIVE
NITRITE: NEGATIVE
PROTEIN: 30 mg/dL — AB
UROBILINOGEN UA: 1 mg/dL (ref 0.0–1.0)
pH: 6 (ref 5.0–8.0)

## 2015-10-17 LAB — GLUCOSE, CAPILLARY: GLUCOSE-CAPILLARY: 148 mg/dL — AB (ref 65–99)

## 2015-10-17 MED ORDER — NIFEDIPINE 10 MG PO CAPS
10.0000 mg | ORAL_CAPSULE | Freq: Once | ORAL | Status: AC
  Administered 2015-10-17: 10 mg via ORAL
  Filled 2015-10-17: qty 1

## 2015-10-17 MED ORDER — LACTATED RINGERS IV BOLUS (SEPSIS)
1000.0000 mL | Freq: Once | INTRAVENOUS | Status: AC
  Administered 2015-10-17: 1000 mL via INTRAVENOUS

## 2015-10-17 NOTE — Progress Notes (Signed)
Subjective:  Carmen Watts is a 34 y.o. [phone] at [redacted]w[redacted]d being seen today for ongoing prenatal care.  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  Patient reports moderately painful contraction every 2-3 minutes..  Contractions: Irregular. Vag. Bleeding: None.  Movement: Present. Denies leaking of fluid.   The following portions of the patient's history were reviewed and updated as appropriate: allergies, current medications, past family history, past medical history, past social history, past surgical history and problem list. Problem list updated.  Objective:   Filed Vitals:   10/17/15 0803  BP: 135/74  Pulse: 102  Weight: 259 lb 8 oz (117.708 kg)    Fetal Status: Fetal Heart Rate (bpm): NST   Movement: Present     General:  Alert, oriented and cooperative. Patient is in no acute distress.  Skin: Skin is warm and dry. No rash noted.   Cardiovascular: Normal heart rate noted  Respiratory: Normal respiratory effort, no problems with respiration noted  Abdomen: Soft, gravid, appropriate for gestational age. Pain/Pressure: Present     Pelvic: Vag. Bleeding: None     Cervical exam performed        Extremities: Normal range of motion.  Edema: Trace  Mental Status: Normal mood and affect. Normal behavior. Normal judgment and thought content.   Urinalysis: Urine Protein: 1+ Urine Glucose: 2+  Assessment and Plan:  Pregnancy: [id number] at [redacted]w[redacted]d  1. Diabetes mellitus during pregnancy, antepartum, third trimester - fasting still not well controlled in 90-100.  Pt did not bing her CBG log.  Pt is very uncomfortable with contractions and can't focus on talking about her diabetes.  Once  she is evaluated in MAU and tocolysis initiated, can address CBGs (possibly increase NPH at night)  - Amniotic fluid index with NST--NST not reactive--needs further monitoring.   2. Polyhydramnios, third trimester, fetus 1 - Amniotic fluid index with NST  3.  Threatened preterm Labor (new problem) -FFN -GBS Needs emergency evaluation for tocolysis and possible steroids.  Preterm labor symptoms and general obstetric precautions including but not limited to vaginal bleeding, contractions, leaking of fluid and fetal movement were reviewed in detail with the patient. Please refer to After Visit Summary for other counseling recommendations.   TO MAU NOW--ESCORTED BY RN  Return in about 1 week (around 10/24/2015) for 2x/wk as scheduled.   Guss Bunde, MD

## 2015-10-17 NOTE — Addendum Note (Signed)
Addended by: Michel Harrow on: 10/17/2015 02:21 PM   Modules accepted: Orders

## 2015-10-17 NOTE — MAU Provider Note (Signed)
History      CSN: [id number]  Arrival date and time: 10/17/15 0850   First Provider Initiated Contact with Patient 10/17/15 1045      Chief Complaint  Patient presents with  . Contractions   HPI   Carmen Watts is a 34 y.o.  [id number] @ [redacted]w[redacted]d presenting for preterm labor. Patient came for her NST and was found to have contractions that were 2-3 minutes apart. Patient has hx of DM, type 2 diabetes, White's classifcation B. Patient is currently takes Humlin 35 AM/ 40 AM, novolog 24 units for breakfast/lunch and 26 at dinner. Patient has had normal monthly u/s. Patient states she was feeling the contractions this morning at 4 AM. Patient denies have contractions currently, states that they have improved signifcantly   OB History    Gravida Para Term Preterm AB TAB SAB Ectopic Multiple Living   6 3 3 0 2 2 0 0 0 3      Past Medical History  Diagnosis Date  . Asthma   . Ovarian cyst   . Gonorrhea   . Diabetes mellitus   . Chlamydia   . Headache(784.0)     Past Surgical History  Procedure Laterality Date  . Cesarean section      x3  . Therapeutic abortion      Family History  Problem Relation Age of Onset  . Thyroid disease Mother   . Diabetes Father   . Hypertension Father   . Ovarian cancer Maternal Grandmother   . Breast cancer Maternal Grandmother   . Cancer Maternal Grandmother   . Cancer Maternal Grandfather   . Lung cancer Maternal Grandfather   . Asthma Son     Social History  Substance Use Topics  . Smoking status: Never Smoker   . Smokeless tobacco: Never Used  . Alcohol Use: No     Comment: Socially before pregnancy    Allergies:  Allergies  Allergen Reactions  . Other Shortness Of Breath and Itching    Fresh fruit    Prescriptions prior to admission  Medication Sig Dispense Refill Last Dose  . aspirin EC 81 MG tablet Take 1 tablet (81 mg total) by mouth daily. Start after [redacted] weeks gestation 90 tablet 3 10/16/2015 at Unknown time  .  calcium carbonate (TUMS - DOSED IN MG ELEMENTAL CALCIUM) 500 MG chewable tablet Chew 3 tablets by mouth daily as needed for heartburn.    10/16/2015 at Unknown time  . insulin aspart (NOVOLOG FLEXPEN) 100 UNIT/ML FlexPen Inject 24-26 Units into the skin 3 (three) times daily with meals. 24 units breakfast and lunch and 26 u dinner 15 mL 11 10/16/2015 at Unknown time  . insulin NPH Human (HUMULIN N,NOVOLIN N) 100 UNIT/ML injection Take 35 units in the morning and 40 units before bedtime. 10 mL 11 10/16/2015 at Unknown time  . nystatin cream (MYCOSTATIN) Apply 1 application topically 2 (two) times daily. 30 g 3 10/16/2015 at Unknown time  . ACCU-CHEK FASTCLIX LANCETS MISC Inject 1 each into the skin 4 (four) times daily. 648.83 for testing 4 times daily 102 each 12 Taking  . albuterol (PROVENTIL HFA;VENTOLIN HFA) 108 (90 BASE) MCG/ACT inhaler Inhale 2 puffs into the lungs every 6 (six) hours as needed for shortness of breath. For asthma syptoms   rescue  . Blood Glucose Monitoring Suppl (ACCU-CHEK NANO SMARTVIEW) W/DEVICE KIT 1 Units by Does not apply route 3 (three) times daily. 1 kit 0 Taking  . glucose blood (ACCU-CHEK ACTIVE STRIPS)   test strip Use as instructed 1 each 12 Taking  . glucose blood test strip Use as instructed 120 each 12 Taking  . Insulin Pen Needle (BD PEN NEEDLE NANO U/F) 32G X 4 MM MISC 1 applicator by Does not apply route 2 (two) times daily before lunch and supper. 30 each 11 Taking    Review of Systems  Constitutional: Negative for fever and chills.  Respiratory: Negative for shortness of breath.   Gastrointestinal: Negative for nausea and vomiting.  Genitourinary: Negative for dysuria.  Neurological: Negative for headaches.   Physical Exam   Blood pressure 107/61, pulse 97, temperature 98 F (36.7 C), temperature source Oral, resp. rate 16, height 5' 2" (1.575 m), weight 259 lb 8 oz (117.708 kg), last menstrual period 01/02/2015, unknown if currently  breastfeeding.  Physical Exam  Constitutional: She is oriented to person, place, and time. She appears well-developed and well-nourished.  Cardiovascular: Normal rate, regular rhythm and intact distal pulses.  Exam reveals no gallop and no friction rub.   No murmur heard. Respiratory: Effort normal and breath sounds normal. No respiratory distress. She has no wheezes. She has no rales.  GI: Soft. Bowel sounds are normal. She exhibits no distension. There is no tenderness. There is no rebound.  Musculoskeletal: Normal range of motion. She exhibits no edema or tenderness.  Neurological: She is alert and oriented to person, place, and time.   Dilation: 1 Effacement (%): 50 Cervical Position: Middle Station: Ballotable Presentation: Vertex Exam by:: Scarlett Murray, RN BSN FHT:  FHR: 135 bpm, variability: moderate,  accelerations:  present,  decelerations: none Contractions: on initial presentation none   MAU Course  Procedures  Premature Contractions Was admitted with contractions every 2-3 minutes. Patient received x3 dose of Procardia. Patient's contractions remitted. Patient cervix was rechecked without change. FHR Cat 1 - Patient to follow up with OB on December 2nd    Assessment and Plan  Preterm Contractions Abdominal Pain in Pregnancy Discharge to home  Carmen Watts 10/17/2015, 11:21 AM   CNM attestation:  I have seen and examined this patient; I agree with above documentation in the Resident's note.    Clemmons,Lori Grissett, CNM 5:25 PM    

## 2015-10-17 NOTE — Discharge Instructions (Signed)
Pelvic Rest Pelvic rest is sometimes recommended for women when:   The placenta is partially or completely covering the opening of the cervix (placenta previa).  There is bleeding between the uterine wall and the amniotic sac in the first trimester (subchorionic hemorrhage).  The cervix begins to open without labor starting (incompetent cervix, cervical insufficiency).  The labor is too early (preterm labor). HOME CARE INSTRUCTIONS  Do not have sexual intercourse, stimulation, or an orgasm.  Do not use tampons, douche, or put anything in the vagina.  Do not lift anything over 10 pounds (4.5 kg).  Avoid strenuous activity or straining your pelvic muscles. SEEK MEDICAL CARE IF:  You have any vaginal bleeding during pregnancy. Treat this as a potential emergency.  You have cramping pain felt low in the stomach (stronger than menstrual cramps).  You notice vaginal discharge (watery, mucus, or bloody).  You have a low, dull backache.  There are regular contractions or uterine tightening. SEEK IMMEDIATE MEDICAL CARE IF: You have vaginal bleeding and have placenta previa.    This information is not intended to replace advice given to you by your health care provider. Make sure you discuss any questions you have with your health care provider.   Document Released: 03/02/2011 Document Revised: 01/28/2012 Document Reviewed: 05/09/2015 Elsevier Interactive Patient Education 2016 [employer]  Fetal Fibronectin Fetal fibronectin (fFN) is a protein that your body produces during pregnancy. This protein is normally found in your vaginal fluid in early pregnancy and just before delivery. It should not be there between 22 and 35 weeks of pregnancy. Having fFN in your vagina between 22 and 35 weeks could be a warning sign that your baby will be born early (prematurely). Babies born prematurely, or before 46 weeks, may have trouble breathing or feeding. A negative fFN test between 22 and 35  weeks means that it is unlikely you will have a premature delivery in the next 2 weeks. You may have this test if you have symptoms of premature labor. These include:  Contractions.  Increased vaginal discharge.  Backache. If there is a chance of preterm labor and delivery, your health care provider will monitor you carefully and take steps to delay your labor if necessary.  This test requires a sample of fluid from inside your vagina. Your health care provider collects this sample using a cotton swab.  PREPARATION FOR TEST   Ask your health care provider if:  You need to avoid using lubricants or douches before this exam.  You need to avoid sexual intercourse for 24 hours before the exam.  Tell your health care provider if you have a vaginal yeast infection or any symptoms of a yeast infection:  Itching.  Soreness.  Discharge. RESULTS It is your responsibility to obtain your test results. Ask the lab or department performing the test when and how you will get your results. Contact your health care provider to discuss any questions you have about your results.  The results of this test will be positive or negative.  Meaning of Negative Test Results A negative result means no fFN was found in your vaginal fluid. A negative result means that there is very little chance you will go into labor in the next two weeks. You may have this test again in two weeks if you are still having symptoms of early labor. Meaning of Positive Test Results A positive result means fFN was found in your vaginal fluid. A positive result does not mean you will go  into early labor. It does mean your risk is greater. Your health care provider may do other tests and exams to closely follow your pregnancy.   This information is not intended to replace advice given to you by your health care provider. Make sure you discuss any questions you have with your health care provider.   Document Released: 09/06/2004  Document Revised: 11/26/2014 Document Reviewed: 02/02/2014 Elsevier Interactive Patient Education 2016 [employer].  Preterm Labor Information Preterm labor is when labor starts at less than 37 weeks of pregnancy. The normal length of a pregnancy is 39 to 41 weeks. CAUSES Often, there is no identifiable underlying cause as to why a woman goes into preterm labor. One of the most common known causes of preterm labor is infection. Infections of the uterus, cervix, vagina, amniotic sac, bladder, kidney, or even the lungs (pneumonia) can cause labor to start. Other suspected causes of preterm labor include:   Urogenital infections, such as yeast infections and bacterial vaginosis.   Uterine abnormalities (uterine shape, uterine septum, fibroids, or bleeding from the placenta).   A cervix that has been operated on (it may fail to stay closed).   Malformations in the fetus.   Multiple gestations (twins, triplets, and so on).   Breakage of the amniotic sac.  RISK FACTORS  Having a previous history of preterm labor.   Having premature rupture of membranes (PROM).   Having a placenta that covers the opening of the cervix (placenta previa).   Having a placenta that separates from the uterus (placental abruption).   Having a cervix that is too weak to hold the fetus in the uterus (incompetent cervix).   Having too much fluid in the amniotic sac (polyhydramnios).   Taking illegal drugs or smoking while pregnant.   Not gaining enough weight while pregnant.   Being younger than 69 and older than 32 years old.   Having a low socioeconomic status.   Being African American. SYMPTOMS Signs and symptoms of preterm labor include:   Menstrual-like cramps, abdominal pain, or back pain.  Uterine contractions that are regular, as frequent as six in an hour, regardless of their intensity (may be mild or painful).  Contractions that start on the top of the uterus and spread  down to the lower abdomen and back.   A sense of increased pelvic pressure.   A watery or bloody mucus discharge that comes from the vagina.  TREATMENT Depending on the length of the pregnancy and other circumstances, your health care provider may suggest bed rest. If necessary, there are medicines that can be given to stop contractions and to mature the fetal lungs. If labor happens before 34 weeks of pregnancy, a prolonged hospital stay may be recommended. Treatment depends on the condition of both you and the fetus.  WHAT SHOULD YOU DO IF YOU THINK YOU ARE IN PRETERM LABOR? Call your health care provider right away. You will need to go to the hospital to get checked immediately. HOW CAN YOU PREVENT PRETERM LABOR IN FUTURE PREGNANCIES? You should:   Stop smoking if you smoke.  Maintain healthy weight gain and avoid chemicals and drugs that are not necessary.  Be watchful for any type of infection.  Inform your health care provider if you have a known history of preterm labor.   This information is not intended to replace advice given to you by your health care provider. Make sure you discuss any questions you have with your health care provider.  Document Released: 01/26/2004 Document Revised: 07/08/2013 Document Reviewed: 12/08/2012 Elsevier Interactive Patient Education [employer].

## 2015-10-17 NOTE — Progress Notes (Signed)
Pt reports increased pain and frequent UC's since 0400 today.  Breastfeeding tip of the week reviewed. [place] for growth done 11/21.

## 2015-10-17 NOTE — MAU Note (Signed)
Patient sent from clinic at [redacted] weeks gestation for monitoring following contractions that were noted on the monitor during her PNV. Fetus active. Denies bleeding or discharge.

## 2015-10-18 LAB — CULTURE, BETA STREP (GROUP B ONLY)

## 2015-10-21 ENCOUNTER — Other Ambulatory Visit: Payer: Self-pay | Admitting: General Practice

## 2015-10-21 ENCOUNTER — Ambulatory Visit (HOSPITAL_COMMUNITY)
Admission: RE | Admit: 2015-10-21 | Discharge: 2015-10-21 | Disposition: A | Discharge status: Home / Self Care | Payer: Medicaid Other | Source: Ambulatory Visit | Attending: Obstetrics & Gynecology | Admitting: Obstetrics & Gynecology

## 2015-10-21 ENCOUNTER — Ambulatory Visit (INDEPENDENT_AMBULATORY_CARE_PROVIDER_SITE_OTHER): Payer: Medicaid Other | Admitting: General Practice

## 2015-10-21 VITALS — BP 105/65 | HR 120

## 2015-10-21 DIAGNOSIS — Z3A34 34 weeks gestation of pregnancy: Secondary | ICD-10-CM

## 2015-10-21 DIAGNOSIS — O24913 Unspecified diabetes mellitus in pregnancy, third trimester: Secondary | ICD-10-CM | POA: Diagnosis not present

## 2015-10-21 DIAGNOSIS — O99213 Obesity complicating pregnancy, third trimester: Secondary | ICD-10-CM

## 2015-10-21 DIAGNOSIS — O34219 Maternal care for unspecified type scar from previous cesarean delivery: Secondary | ICD-10-CM | POA: Insufficient documentation

## 2015-10-21 DIAGNOSIS — O34211 Maternal care for low transverse scar from previous cesarean delivery: Secondary | ICD-10-CM

## 2015-10-21 DIAGNOSIS — O24113 Pre-existing diabetes mellitus, type 2, in pregnancy, third trimester: Secondary | ICD-10-CM | POA: Insufficient documentation

## 2015-10-24 ENCOUNTER — Ambulatory Visit (INDEPENDENT_AMBULATORY_CARE_PROVIDER_SITE_OTHER): Payer: Medicaid Other | Admitting: Obstetrics and Gynecology

## 2015-10-24 ENCOUNTER — Ambulatory Visit (HOSPITAL_COMMUNITY)
Admission: RE | Admit: 2015-10-24 | Discharge: 2015-10-24 | Disposition: A | Discharge status: Home / Self Care | Payer: Medicaid Other | Source: Ambulatory Visit | Attending: Obstetrics and Gynecology | Admitting: Obstetrics and Gynecology

## 2015-10-24 ENCOUNTER — Other Ambulatory Visit: Payer: Self-pay | Admitting: Obstetrics and Gynecology

## 2015-10-24 ENCOUNTER — Encounter: Payer: Self-pay | Admitting: Obstetrics and Gynecology

## 2015-10-24 ENCOUNTER — Encounter (HOSPITAL_COMMUNITY): Payer: Self-pay

## 2015-10-24 VITALS — BP 111/67 | HR 112 | Wt 257.6 lb

## 2015-10-24 DIAGNOSIS — O24113 Pre-existing diabetes mellitus, type 2, in pregnancy, third trimester: Secondary | ICD-10-CM | POA: Insufficient documentation

## 2015-10-24 DIAGNOSIS — O34219 Maternal care for unspecified type scar from previous cesarean delivery: Secondary | ICD-10-CM

## 2015-10-24 DIAGNOSIS — O403XX0 Polyhydramnios, third trimester, not applicable or unspecified: Secondary | ICD-10-CM

## 2015-10-24 DIAGNOSIS — O289 Unspecified abnormal findings on antenatal screening of mother: Secondary | ICD-10-CM

## 2015-10-24 DIAGNOSIS — O99213 Obesity complicating pregnancy, third trimester: Secondary | ICD-10-CM

## 2015-10-24 DIAGNOSIS — O288 Other abnormal findings on antenatal screening of mother: Secondary | ICD-10-CM

## 2015-10-24 DIAGNOSIS — Z3A34 34 weeks gestation of pregnancy: Secondary | ICD-10-CM

## 2015-10-24 DIAGNOSIS — O24913 Unspecified diabetes mellitus in pregnancy, third trimester: Secondary | ICD-10-CM

## 2015-10-24 DIAGNOSIS — O283 Abnormal ultrasonic finding on antenatal screening of mother: Secondary | ICD-10-CM | POA: Insufficient documentation

## 2015-10-24 DIAGNOSIS — O403XX1 Polyhydramnios, third trimester, fetus 1: Secondary | ICD-10-CM

## 2015-10-24 DIAGNOSIS — O0993 Supervision of high risk pregnancy, unspecified, third trimester: Secondary | ICD-10-CM

## 2015-10-24 LAB — POCT URINALYSIS DIP (DEVICE)
Bilirubin Urine: NEGATIVE
Glucose, UA: 250 mg/dL — AB
Hgb urine dipstick: NEGATIVE
KETONES UR: 40 mg/dL — AB
Nitrite: NEGATIVE
PH: 6 (ref 5.0–8.0)
PROTEIN: 30 mg/dL — AB
Specific Gravity, Urine: 1.02 (ref 1.005–1.030)
UROBILINOGEN UA: 1 mg/dL (ref 0.0–1.0)

## 2015-10-24 NOTE — Progress Notes (Signed)
Subjective:  Carmen Watts is a 34 y.o. [phone] at [redacted]w[redacted]d being seen today for ongoing prenatal care.  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  Patient reports occasional contractions.  Contractions: Irregular. Vag. Bleeding: None.  Movement: Present. Denies leaking of fluid.   The following portions of the patient's history were reviewed and updated as appropriate: allergies, current medications, past family history, past medical history, past social history, past surgical history and problem list. Problem list updated.  Objective:   Filed Vitals:   10/24/15 0758  BP: 111/67  Pulse: 112  Weight: 257 lb 9.6 oz (116.847 kg)    Fetal Status: Fetal Heart Rate (bpm): NST   Movement: Present     General:  Alert, oriented and cooperative. Patient is in no acute distress.  Skin: Skin is warm and dry. No rash noted.   Cardiovascular: Normal heart rate noted  Respiratory: Normal respiratory effort, no problems with respiration noted  Abdomen: Soft, gravid, appropriate for gestational age. Pain/Pressure: Present     Pelvic: Vag. Bleeding: None     Cervical exam deferred        Extremities: Normal range of motion.  Edema: None  Mental Status: Normal mood and affect. Normal behavior. Normal judgment and thought content.   Urinalysis:      Assessment and Plan:  Pregnancy: [id number] at [redacted]w[redacted]d  1. Diabetes mellitus during pregnancy, antepartum, third trimester CBGs reviewed and greater than 50 % within range. Fasting as high as 107 and pp as high as 133. Reviewed diabetic diet Follow up growth ultrasound on 12/19 NST reviewed and non reactive. Patient sent to radiology for a BPP  2.  Polyhydramnios, third trimester, fetus 1   3. Previous cesarean delivery, antepartum condition or complication Scheduled for repeat with BTL on 1/6  4. Supervision of high risk pregnancy, antepartum, third trimester   Preterm labor symptoms and general obstetric precautions including but not limited to vaginal bleeding, contractions, leaking of fluid and fetal movement were reviewed in detail with the patient. Please refer to After Visit Summary for other counseling recommendations.  Return in about 1 week (around 10/31/2015).   Mora Bellman, MD

## 2015-10-27 NOTE — Addendum Note (Signed)
Addended by: Tarry Kos on: 10/27/2015 02:36 PM   Modules accepted: Orders

## 2015-10-28 ENCOUNTER — Ambulatory Visit (INDEPENDENT_AMBULATORY_CARE_PROVIDER_SITE_OTHER): Payer: Medicaid Other | Admitting: *Deleted

## 2015-10-28 VITALS — BP 122/65 | HR 117

## 2015-10-28 DIAGNOSIS — O24913 Unspecified diabetes mellitus in pregnancy, third trimester: Secondary | ICD-10-CM

## 2015-10-28 NOTE — Progress Notes (Signed)
NST performed today was reviewed and was found to be reactive.  Continue recommended antenatal testing and prenatal care.  

## 2015-10-28 NOTE — Progress Notes (Signed)
Labor sx reviewed.

## 2015-10-31 ENCOUNTER — Ambulatory Visit (INDEPENDENT_AMBULATORY_CARE_PROVIDER_SITE_OTHER): Payer: Medicaid Other | Admitting: Family Medicine

## 2015-10-31 ENCOUNTER — Other Ambulatory Visit (HOSPITAL_COMMUNITY)
Admission: RE | Admit: 2015-10-31 | Discharge: 2015-10-31 | Disposition: A | Discharge status: Home / Self Care | Payer: Medicaid Other | Source: Ambulatory Visit | Attending: Family Medicine | Admitting: Family Medicine

## 2015-10-31 ENCOUNTER — Encounter: Payer: Self-pay | Admitting: Family Medicine

## 2015-10-31 VITALS — BP 115/71 | HR 112 | Wt 257.4 lb

## 2015-10-31 DIAGNOSIS — O34219 Maternal care for unspecified type scar from previous cesarean delivery: Secondary | ICD-10-CM

## 2015-10-31 DIAGNOSIS — O24913 Unspecified diabetes mellitus in pregnancy, third trimester: Secondary | ICD-10-CM | POA: Diagnosis present

## 2015-10-31 DIAGNOSIS — Z36 Encounter for antenatal screening of mother: Secondary | ICD-10-CM

## 2015-10-31 DIAGNOSIS — O0993 Supervision of high risk pregnancy, unspecified, third trimester: Secondary | ICD-10-CM

## 2015-10-31 DIAGNOSIS — O403XX1 Polyhydramnios, third trimester, fetus 1: Secondary | ICD-10-CM

## 2015-10-31 DIAGNOSIS — O403XX0 Polyhydramnios, third trimester, not applicable or unspecified: Secondary | ICD-10-CM

## 2015-10-31 DIAGNOSIS — Z113 Encounter for screening for infections with a predominantly sexual mode of transmission: Secondary | ICD-10-CM | POA: Diagnosis present

## 2015-10-31 LAB — POCT URINALYSIS DIP (DEVICE)
BILIRUBIN URINE: NEGATIVE
Glucose, UA: NEGATIVE mg/dL
HGB URINE DIPSTICK: NEGATIVE
KETONES UR: 40 mg/dL — AB
Nitrite: NEGATIVE
PH: 5.5 (ref 5.0–8.0)
PROTEIN: NEGATIVE mg/dL
Specific Gravity, Urine: 1.02 (ref 1.005–1.030)
Urobilinogen, UA: 1 mg/dL (ref 0.0–1.0)

## 2015-10-31 NOTE — Patient Instructions (Signed)
Breastfeeding Deciding to breastfeed is one of the best choices you can make for you and your baby. A change in hormones during pregnancy causes your breast tissue to grow and increases the number and size of your milk ducts. These hormones also allow proteins, sugars, and fats from your blood supply to make breast milk in your milk-producing glands. Hormones prevent breast milk from being released before your baby is born as well as prompt milk flow after birth. Once breastfeeding has begun, thoughts of your baby, as well as his or her sucking or crying, can stimulate the release of milk from your milk-producing glands.  BENEFITS OF BREASTFEEDING For Your Baby  Your first milk (colostrum) helps your baby's digestive system function better.  There are antibodies in your milk that help your baby fight off infections.  Your baby has a lower incidence of asthma, allergies, and sudden infant death syndrome.  The nutrients in breast milk are better for your baby than infant formulas and are designed uniquely for your baby's needs.  Breast milk improves your baby's brain development.  Your baby is less likely to develop other conditions, such as childhood obesity, asthma, or type 2 diabetes mellitus. For You  Breastfeeding helps to create a very special bond between you and your baby.  Breastfeeding is convenient. Breast milk is always available at the correct temperature and costs nothing.  Breastfeeding helps to burn calories and helps you lose the weight gained during pregnancy.  Breastfeeding makes your uterus contract to its prepregnancy size faster and slows bleeding (lochia) after you give birth.   Breastfeeding helps to lower your risk of developing type 2 diabetes mellitus, osteoporosis, and breast or ovarian cancer later in life. SIGNS THAT YOUR BABY IS HUNGRY Early Signs of Hunger  Increased alertness or activity.  Stretching.  Movement of the head from side to  side.  Movement of the head and opening of the mouth when the corner of the mouth or cheek is stroked (rooting).  Increased sucking sounds, smacking lips, cooing, sighing, or squeaking.  Hand-to-mouth movements.  Increased sucking of fingers or hands. Late Signs of Hunger  Fussing.  Intermittent crying. Extreme Signs of Hunger Signs of extreme hunger will require calming and consoling before your baby will be able to breastfeed successfully. Do not wait for the following signs of extreme hunger to occur before you initiate breastfeeding:  Restlessness.  A loud, strong cry.  Screaming. BREASTFEEDING BASICS Breastfeeding Initiation  Find a comfortable place to sit or lie down, with your neck and back well supported.  Place a pillow or rolled up blanket under your baby to bring him or her to the level of your breast (if you are seated). Nursing pillows are specially designed to help support your arms and your baby while you breastfeed.  Make sure that your baby's abdomen is facing your abdomen.  Gently massage your breast. With your fingertips, massage from your chest wall toward your nipple in a circular motion. This encourages milk flow. You may need to continue this action during the feeding if your milk flows slowly.  Support your breast with 4 fingers underneath and your thumb above your nipple. Make sure your fingers are well away from your nipple and your baby's mouth.  Stroke your baby's lips gently with your finger or nipple.  When your baby's mouth is open wide enough, quickly bring your baby to your breast, placing your entire nipple and as much of the colored area around your nipple (  areola) as possible into your baby's mouth.  More areola should be visible above your baby's upper lip than below the lower lip.  Your baby's tongue should be between his or her lower gum and your breast.  Ensure that your baby's mouth is correctly positioned around your nipple  (latched). Your baby's lips should create a seal on your breast and be turned out (everted).  It is common for your baby to suck about 2-3 minutes in order to start the flow of breast milk. Latching Teaching your baby how to latch on to your breast properly is very important. An improper latch can cause nipple pain and decreased milk supply for you and poor weight gain in your baby. Also, if your baby is not latched onto your nipple properly, he or she may swallow some air during feeding. This can make your baby fussy. Burping your baby when you switch breasts during the feeding can help to get rid of the air. However, teaching your baby to latch on properly is still the best way to prevent fussiness from swallowing air while breastfeeding. Signs that your baby has successfully latched on to your nipple:  Silent tugging or silent sucking, without causing you pain.  Swallowing heard between every 3-4 sucks.  Muscle movement above and in front of his or her ears while sucking. Signs that your baby has not successfully latched on to nipple:  Sucking sounds or smacking sounds from your baby while breastfeeding.  Nipple pain. If you think your baby has not latched on correctly, slip your finger into the corner of your baby's mouth to break the suction and place it between your baby's gums. Attempt breastfeeding initiation again. Signs of Successful Breastfeeding Signs from your baby:  A gradual decrease in the number of sucks or complete cessation of sucking.  Falling asleep.  Relaxation of his or her body.  Retention of a small amount of milk in his or her mouth.  Letting go of your breast by himself or herself. Signs from you:  Breasts that have increased in firmness, weight, and size 1-3 hours after feeding.  Breasts that are softer immediately after breastfeeding.  Increased milk volume, as well as a change in milk consistency and color by the fifth day of breastfeeding.  Nipples  that are not sore, cracked, or bleeding. Signs That Your Baby is Getting Enough Milk  Wetting at least 3 diapers in a 24-hour period. The urine should be clear and pale yellow by age 5 days.  At least 3 stools in a 24-hour period by age 5 days. The stool should be soft and yellow.  At least 3 stools in a 24-hour period by age 7 days. The stool should be seedy and yellow.  No loss of weight greater than 10% of birth weight during the first 3 days of age.  Average weight gain of 4-7 ounces (113-198 g) per week after age 4 days.  Consistent daily weight gain by age 5 days, without weight loss after the age of 2 weeks. After a feeding, your baby may spit up a small amount. This is common. BREASTFEEDING FREQUENCY AND DURATION Frequent feeding will help you make more milk and can prevent sore nipples and breast engorgement. Breastfeed when you feel the need to reduce the fullness of your breasts or when your baby shows signs of hunger. This is called "breastfeeding on demand." Avoid introducing a pacifier to your baby while you are working to establish breastfeeding (the first 4-6 weeks   after your baby is born). After this time you may choose to use a pacifier. Research has shown that pacifier use during the first year of a baby's life decreases the risk of sudden infant death syndrome (SIDS). Allow your baby to feed on each breast as long as he or she wants. Breastfeed until your baby is finished feeding. When your baby unlatches or falls asleep while feeding from the first breast, offer the second breast. Because newborns are often sleepy in the first few weeks of life, you may need to awaken your baby to get him or her to feed. Breastfeeding times will vary from baby to baby. However, the following rules can serve as a guide to help you ensure that your baby is properly fed:  Newborns (babies 4 weeks of age or younger) may breastfeed every 1-3 hours.  Newborns should not go longer than 3 hours  during the day or 5 hours during the night without breastfeeding.  You should breastfeed your baby a minimum of 8 times in a 24-hour period until you begin to introduce solid foods to your baby at around 6 months of age. BREAST MILK PUMPING Pumping and storing breast milk allows you to ensure that your baby is exclusively fed your breast milk, even at times when you are unable to breastfeed. This is especially important if you are going back to work while you are still breastfeeding or when you are not able to be present during feedings. Your lactation consultant can give you guidelines on how long it is safe to store breast milk. A breast pump is a machine that allows you to pump milk from your breast into a sterile bottle. The pumped breast milk can then be stored in a refrigerator or freezer. Some breast pumps are operated by hand, while others use electricity. Ask your lactation consultant which type will work best for you. Breast pumps can be purchased, but some hospitals and breastfeeding support groups lease breast pumps on a monthly basis. A lactation consultant can teach you how to hand express breast milk, if you prefer not to use a pump. CARING FOR YOUR BREASTS WHILE YOU BREASTFEED Nipples can become dry, cracked, and sore while breastfeeding. The following recommendations can help keep your breasts moisturized and healthy:  Avoid using soap on your nipples.  Wear a supportive bra. Although not required, special nursing bras and tank tops are designed to allow access to your breasts for breastfeeding without taking off your entire bra or top. Avoid wearing underwire-style bras or extremely tight bras.  Air dry your nipples for 3-4minutes after each feeding.  Use only cotton bra pads to absorb leaked breast milk. Leaking of breast milk between feedings is normal.  Use lanolin on your nipples after breastfeeding. Lanolin helps to maintain your skin's normal moisture barrier. If you use  pure lanolin, you do not need to wash it off before feeding your baby again. Pure lanolin is not toxic to your baby. You may also hand express a few drops of breast milk and gently massage that milk into your nipples and allow the milk to air dry. In the first few weeks after giving birth, some women experience extremely full breasts (engorgement). Engorgement can make your breasts feel heavy, warm, and tender to the touch. Engorgement peaks within 3-5 days after you give birth. The following recommendations can help ease engorgement:  Completely empty your breasts while breastfeeding or pumping. You may want to start by applying warm, moist heat (in   the shower or with warm water-soaked hand towels) just before feeding or pumping. This increases circulation and helps the milk flow. If your baby does not completely empty your breasts while breastfeeding, pump any extra milk after he or she is finished.  Wear a snug bra (nursing or regular) or tank top for 1-2 days to signal your body to slightly decrease milk production.  Apply ice packs to your breasts, unless this is too uncomfortable for you.  Make sure that your baby is latched on and positioned properly while breastfeeding. If engorgement persists after 48 hours of following these recommendations, contact your health care provider or a lactation consultant. OVERALL HEALTH CARE RECOMMENDATIONS WHILE BREASTFEEDING  Eat healthy foods. Alternate between meals and snacks, eating 3 of each per day. Because what you eat affects your breast milk, some of the foods may make your baby more irritable than usual. Avoid eating these foods if you are sure that they are negatively affecting your baby.  Drink milk, fruit juice, and water to satisfy your thirst (about 10 glasses a day).  Rest often, relax, and continue to take your prenatal vitamins to prevent fatigue, stress, and anemia.  Continue breast self-awareness checks.  Avoid chewing and smoking  tobacco. Chemicals from cigarettes that pass into breast milk and exposure to secondhand smoke may harm your baby.  Avoid alcohol and drug use, including marijuana. Some medicines that may be harmful to your baby can pass through breast milk. It is important to ask your health care provider before taking any medicine, including all over-the-counter and prescription medicine as well as vitamin and herbal supplements. It is possible to become pregnant while breastfeeding. If birth control is desired, ask your health care provider about options that will be safe for your baby. SEEK MEDICAL CARE IF:  You feel like you want to stop breastfeeding or have become frustrated with breastfeeding.  You have painful breasts or nipples.  Your nipples are cracked or bleeding.  Your breasts are red, tender, or warm.  You have a swollen area on either breast.  You have a fever or chills.  You have nausea or vomiting.  You have drainage other than breast milk from your nipples.  Your breasts do not become full before feedings by the fifth day after you give birth.  You feel sad and depressed.  Your baby is too sleepy to eat well.  Your baby is having trouble sleeping.   Your baby is wetting less than 3 diapers in a 24-hour period.  Your baby has less than 3 stools in a 24-hour period.  Your baby's skin or the white part of his or her eyes becomes yellow.   Your baby is not gaining weight by 5 days of age. SEEK IMMEDIATE MEDICAL CARE IF:  Your baby is overly tired (lethargic) and does not want to wake up and feed.  Your baby develops an unexplained fever.   This information is not intended to replace advice given to you by your health care provider. Make sure you discuss any questions you have with your health care provider.   Document Released: 11/05/2005 Document Revised: 07/27/2015 Document Reviewed: 04/29/2013 Elsevier Interactive Patient Education 2016 [employer]  

## 2015-10-31 NOTE — Progress Notes (Signed)
  Subjective:  Carmen Watts is a 34 y.o. [phone] at [redacted]w[redacted]d being seen today for ongoing prenatal care.  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  Patient reports contractions since 24-48 hours ago.  Contractions: Irregular. Vag. Bleeding: None.  Movement: Present. Denies leaking of fluid.   The following portions of the patient's history were reviewed and updated as appropriate: allergies, current medications, past family history, past medical history, past social history, past surgical history and problem list. Problem list updated.  Objective:   Filed Vitals:   10/31/15 0751  BP: 115/71  Pulse: 112  Weight: 257 lb 6.4 oz (116.756 kg)    Fetal Status: Fetal Heart Rate (bpm): NST   Movement: Present  Presentation: Vertex  General:  Alert, oriented and cooperative. Patient is in no acute distress.  Skin: Skin is warm and dry. No rash noted.   Cardiovascular: Normal heart rate noted  Respiratory: Normal respiratory effort, no problems with respiration noted  Abdomen: Soft, gravid, appropriate for gestational age. Pain/Pressure: Present     Pelvic: Vag. Bleeding: None Vag D/C Character: White   Cervical exam performed Dilation: 1 Effacement (%): 70 Station: -2  Extremities: Normal range of motion.  Edema: None  Mental Status: Normal mood and affect. Normal behavior. Normal judgment and thought content.   Urinalysis: Urine Protein: Negative Urine Glucose: Negative No book, but reports BS are "WNL" NST reviewed and reactive. Assessment and Plan:  Pregnancy: [id number] at [redacted]w[redacted]d  1. Diabetes mellitus during pregnancy, antepartum, third trimester Continue current insulin  dosing - Amniotic fluid index with NST - GC/Chlamydia probe amp ([place])not at [facility]  2. Polyhydramnios, third trimester, fetus 1 AFI 15 today - Amniotic fluid index with NST - GC/Chlamydia probe amp ([place])not at [facility]  3. Supervision of high risk pregnancy, antepartum, third trimester Continue prenatal care.  4. Previous cesarean delivery, antepartum condition or complication Scheduled for Repeat  Preterm labor symptoms and general obstetric precautions including but not limited to vaginal bleeding, contractions, leaking of fluid and fetal movement were reviewed in detail with the patient. Please refer to After Visit Summary for other counseling recommendations.  Return in about 4 days (around 11/04/2015) for as scheduled.   [name], MD

## 2015-10-31 NOTE — Progress Notes (Signed)
Pt reports increased pelvic pain - requests Cx exam.  [place] for growth scheduled 12/19.  Rpt C/S on 11/25/15

## 2015-11-01 LAB — GC/CHLAMYDIA PROBE AMP (~~LOC~~) NOT AT ARMC
Chlamydia: NEGATIVE
Neisseria Gonorrhea: NEGATIVE

## 2015-11-04 ENCOUNTER — Ambulatory Visit (INDEPENDENT_AMBULATORY_CARE_PROVIDER_SITE_OTHER): Payer: Medicaid Other | Admitting: *Deleted

## 2015-11-04 VITALS — BP 119/65 | HR 104

## 2015-11-04 DIAGNOSIS — O24913 Unspecified diabetes mellitus in pregnancy, third trimester: Secondary | ICD-10-CM

## 2015-11-04 MED ORDER — TRAMADOL-ACETAMINOPHEN 37.5-325 MG PO TABS: 1.0000 | ORAL_TABLET | Freq: Four times a day (QID) | ORAL | Status: DC | PRN

## 2015-11-04 NOTE — Patient Instructions (Signed)

## 2015-11-04 NOTE — Progress Notes (Signed)
Pt states her incision is burning, stinging and feels like it's going to pop open.

## 2015-11-04 NOTE — Progress Notes (Signed)
.   Subjective:  Carmen Watts is a 34 y.o. [phone] at [redacted]w[redacted]d being seen today for ongoing prenatal care.  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  Patient reports occasional contractions and s/p burning pain.   .  .  Movement: Present. Denies leaking of fluid.   The following portions of the patient's history were reviewed and updated as appropriate: allergies, current medications, past family history, past medical history, past social history, past surgical history and problem list. Problem list updated.  Objective:   Filed Vitals:   11/04/15 0816  BP: 119/65  Pulse: 104    Fetal Status:     Movement: Present     General:  Alert, oriented and cooperative. Patient is in no acute distress.  Skin: Skin is warm and dry. No rash noted.   Cardiovascular: Normal heart rate noted  Respiratory: Normal respiratory effort, no problems with respiration noted  Abdomen: Soft, gravid, appropriate for gestational age.       Pelvic:       Cervical exam performed        Extremities: Normal range of motion.     Mental Status: Normal mood and affect. Normal behavior. Normal judgment and thought content.   Urinalysis:      Assessment and Plan:  Pregnancy: [id number] at [redacted]w[redacted]d  1. Diabetes mellitus during pregnancy, antepartum, third trimester Good BS control - Fetal nonstress test - traMADol-acetaminophen (ULTRACET) 37.5-325 MG tablet; Take 1 tablet by mouth every 6 (six) hours as needed.  Dispense: 15 tablet; Refill: 0  Preterm labor symptoms and general obstetric precautions including but not limited to vaginal bleeding, contractions, leaking of fluid and fetal movement were reviewed in  detail with the patient. Please refer to After Visit Summary for other counseling recommendations.  Return in about 3 days (around 11/07/2015) for as scheduled. No evidence of PTL but has s/p burning pain, benign abd exam, c/w MS pain, Rx Ultracet prn.   Woodroe Mode, MD

## 2015-11-07 ENCOUNTER — Ambulatory Visit (HOSPITAL_COMMUNITY): Payer: Medicaid Other

## 2015-11-07 ENCOUNTER — Encounter (HOSPITAL_COMMUNITY): Payer: Self-pay

## 2015-11-07 ENCOUNTER — Ambulatory Visit (INDEPENDENT_AMBULATORY_CARE_PROVIDER_SITE_OTHER): Payer: Medicaid Other | Admitting: Family Medicine

## 2015-11-07 ENCOUNTER — Encounter: Payer: Self-pay | Admitting: *Deleted

## 2015-11-07 ENCOUNTER — Ambulatory Visit (HOSPITAL_COMMUNITY)
Admission: RE | Admit: 2015-11-07 | Discharge: 2015-11-07 | Disposition: A | Discharge status: Home / Self Care | Payer: Medicaid Other | Source: Ambulatory Visit | Attending: Maternal and Fetal Medicine | Admitting: Maternal and Fetal Medicine

## 2015-11-07 ENCOUNTER — Encounter: Payer: Self-pay | Admitting: Family Medicine

## 2015-11-07 ENCOUNTER — Other Ambulatory Visit: Payer: Medicaid Other

## 2015-11-07 VITALS — BP 117/73 | HR 122 | Wt 254.6 lb

## 2015-11-07 DIAGNOSIS — O24113 Pre-existing diabetes mellitus, type 2, in pregnancy, third trimester: Secondary | ICD-10-CM | POA: Diagnosis not present

## 2015-11-07 DIAGNOSIS — Z3A36 36 weeks gestation of pregnancy: Secondary | ICD-10-CM | POA: Insufficient documentation

## 2015-11-07 DIAGNOSIS — O403XX0 Polyhydramnios, third trimester, not applicable or unspecified: Secondary | ICD-10-CM | POA: Diagnosis not present

## 2015-11-07 DIAGNOSIS — O403XX1 Polyhydramnios, third trimester, fetus 1: Secondary | ICD-10-CM

## 2015-11-07 DIAGNOSIS — O24913 Unspecified diabetes mellitus in pregnancy, third trimester: Secondary | ICD-10-CM | POA: Diagnosis present

## 2015-11-07 DIAGNOSIS — O283 Abnormal ultrasonic finding on antenatal screening of mother: Secondary | ICD-10-CM | POA: Diagnosis not present

## 2015-11-07 DIAGNOSIS — O99213 Obesity complicating pregnancy, third trimester: Secondary | ICD-10-CM | POA: Insufficient documentation

## 2015-11-07 DIAGNOSIS — O34219 Maternal care for unspecified type scar from previous cesarean delivery: Secondary | ICD-10-CM | POA: Diagnosis not present

## 2015-11-07 DIAGNOSIS — O24919 Unspecified diabetes mellitus in pregnancy, unspecified trimester: Secondary | ICD-10-CM

## 2015-11-07 LAB — POCT URINALYSIS DIP (DEVICE)
BILIRUBIN URINE: NEGATIVE
Hgb urine dipstick: NEGATIVE
KETONES UR: 40 mg/dL — AB
Nitrite: NEGATIVE
PH: 5.5 (ref 5.0–8.0)
PROTEIN: 30 mg/dL — AB
SPECIFIC GRAVITY, URINE: 1.025 (ref 1.005–1.030)
Urobilinogen, UA: 1 mg/dL (ref 0.0–1.0)

## 2015-11-07 NOTE — Progress Notes (Signed)
Subjective:  Carmen Watts is a 34 y.o. [phone] at [redacted]w[redacted]d being seen today for ongoing prenatal care.  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  Patient reports backache, occasional contractions and low abdominal pain.  Contractions: Irregular. Vag. Bleeding: None.  Movement: Present. Denies leaking of fluid.   The following portions of the patient's history were reviewed and updated as appropriate: allergies, current medications, past family history, past medical history, past social history, past surgical history and problem list. Problem list updated.  Objective:   Filed Vitals:   11/07/15 0920  BP: 117/73  Pulse: 122  Weight: 254 lb 9.6 oz (115.486 kg)    Fetal Status: Fetal Heart Rate (bpm): NST   Movement: Present     General:  Alert, oriented and cooperative. Patient is in no acute distress.  Skin: Skin is warm and dry. No rash noted.   Cardiovascular: Normal heart rate noted  Respiratory: Normal respiratory effort, no problems with respiration noted  Abdomen: Soft, gravid, appropriate for gestational age. Pain/Pressure: Present     Pelvic: Vag. Bleeding: None     Cervical exam deferred        Extremities: Normal range of motion.  Edema: None  Mental Status: Normal mood and affect. Normal behavior. Normal judgment and thought content.   Urinalysis: Urine Protein: Negative Urine Glucose: Negative NST reviewed and reactive. U/s today 7 lb 3 oz 83%, AC > 97%, AFI 18, vtx  Assessment and Plan:  Pregnancy: [id number] at [redacted]w[redacted]d  1. Diabetes mellitus during pregnancy, antepartum, third trimester Continue insulin and meds - Fetal nonstress test and 2x/wk until  delivery  2. Polyhydramnios, third trimester, fetus 1 Resolved now - Fetal nonstress test  Preterm labor symptoms and general obstetric precautions including but not limited to vaginal bleeding, contractions, leaking of fluid and fetal movement were reviewed in detail with the patient. Please refer to After Visit Summary for other counseling recommendations.   Return in about 1 week (around 11/14/2015) for OB visit and NST, HRC, NST only in 4 days.  [name], MD

## 2015-11-07 NOTE — Progress Notes (Signed)
[place] for growth done today - polyhydramnios has resolved.  Pt having difficulty walking due to abdominal and pelvic pain.

## 2015-11-11 ENCOUNTER — Encounter (HOSPITAL_COMMUNITY): Payer: Self-pay | Admitting: *Deleted

## 2015-11-11 ENCOUNTER — Encounter (HOSPITAL_COMMUNITY)
Admission: AD | Disposition: A | Discharge status: Home / Self Care | Payer: Self-pay | Source: Ambulatory Visit | Attending: Family Medicine

## 2015-11-11 ENCOUNTER — Inpatient Hospital Stay (HOSPITAL_COMMUNITY)
Admission: AD | Admit: 2015-11-11 | Discharge: 2015-11-14 | DRG: 765 | Disposition: A | Discharge status: Home / Self Care | Payer: Medicaid Other | Source: Ambulatory Visit | Attending: Family Medicine | Admitting: Family Medicine

## 2015-11-11 ENCOUNTER — Inpatient Hospital Stay (HOSPITAL_COMMUNITY): Payer: Medicaid Other

## 2015-11-11 ENCOUNTER — Inpatient Hospital Stay (HOSPITAL_COMMUNITY): Payer: Medicaid Other | Admitting: Anesthesiology

## 2015-11-11 ENCOUNTER — Ambulatory Visit (INDEPENDENT_AMBULATORY_CARE_PROVIDER_SITE_OTHER): Payer: Medicaid Other | Admitting: *Deleted

## 2015-11-11 VITALS — BP 110/64 | HR 116

## 2015-11-11 DIAGNOSIS — Z3A37 37 weeks gestation of pregnancy: Secondary | ICD-10-CM

## 2015-11-11 DIAGNOSIS — O34211 Maternal care for low transverse scar from previous cesarean delivery: Secondary | ICD-10-CM | POA: Diagnosis present

## 2015-11-11 DIAGNOSIS — Z825 Family history of asthma and other chronic lower respiratory diseases: Secondary | ICD-10-CM | POA: Diagnosis not present

## 2015-11-11 DIAGNOSIS — Z8041 Family history of malignant neoplasm of ovary: Secondary | ICD-10-CM

## 2015-11-11 DIAGNOSIS — Z98891 History of uterine scar from previous surgery: Secondary | ICD-10-CM

## 2015-11-11 DIAGNOSIS — Z801 Family history of malignant neoplasm of trachea, bronchus and lung: Secondary | ICD-10-CM

## 2015-11-11 DIAGNOSIS — O99824 Streptococcus B carrier state complicating childbirth: Secondary | ICD-10-CM | POA: Diagnosis present

## 2015-11-11 DIAGNOSIS — E119 Type 2 diabetes mellitus without complications: Secondary | ICD-10-CM | POA: Diagnosis present

## 2015-11-11 DIAGNOSIS — J45909 Unspecified asthma, uncomplicated: Secondary | ICD-10-CM | POA: Diagnosis present

## 2015-11-11 DIAGNOSIS — O0990 Supervision of high risk pregnancy, unspecified, unspecified trimester: Secondary | ICD-10-CM

## 2015-11-11 DIAGNOSIS — Z302 Encounter for sterilization: Secondary | ICD-10-CM

## 2015-11-11 DIAGNOSIS — O24913 Unspecified diabetes mellitus in pregnancy, third trimester: Secondary | ICD-10-CM

## 2015-11-11 DIAGNOSIS — O9952 Diseases of the respiratory system complicating childbirth: Secondary | ICD-10-CM | POA: Diagnosis present

## 2015-11-11 DIAGNOSIS — O99214 Obesity complicating childbirth: Secondary | ICD-10-CM | POA: Diagnosis present

## 2015-11-11 DIAGNOSIS — Z8349 Family history of other endocrine, nutritional and metabolic diseases: Secondary | ICD-10-CM

## 2015-11-11 DIAGNOSIS — O2492 Unspecified diabetes mellitus in childbirth: Secondary | ICD-10-CM | POA: Diagnosis not present

## 2015-11-11 DIAGNOSIS — O409XX0 Polyhydramnios, unspecified trimester, not applicable or unspecified: Secondary | ICD-10-CM | POA: Diagnosis present

## 2015-11-11 DIAGNOSIS — R51 Headache: Secondary | ICD-10-CM | POA: Diagnosis present

## 2015-11-11 DIAGNOSIS — Z803 Family history of malignant neoplasm of breast: Secondary | ICD-10-CM | POA: Diagnosis not present

## 2015-11-11 DIAGNOSIS — O403XX0 Polyhydramnios, third trimester, not applicable or unspecified: Secondary | ICD-10-CM

## 2015-11-11 DIAGNOSIS — Z8249 Family history of ischemic heart disease and other diseases of the circulatory system: Secondary | ICD-10-CM | POA: Diagnosis not present

## 2015-11-11 DIAGNOSIS — R109 Unspecified abdominal pain: Secondary | ICD-10-CM

## 2015-11-11 DIAGNOSIS — Z794 Long term (current) use of insulin: Secondary | ICD-10-CM

## 2015-11-11 DIAGNOSIS — O26899 Other specified pregnancy related conditions, unspecified trimester: Secondary | ICD-10-CM

## 2015-11-11 DIAGNOSIS — Z7982 Long term (current) use of aspirin: Secondary | ICD-10-CM | POA: Diagnosis not present

## 2015-11-11 DIAGNOSIS — O34219 Maternal care for unspecified type scar from previous cesarean delivery: Secondary | ICD-10-CM | POA: Diagnosis present

## 2015-11-11 LAB — CBC
HEMATOCRIT: 35 % — AB (ref 36.0–46.0)
HEMOGLOBIN: 11.6 g/dL — AB (ref 12.0–15.0)
MCH: 25.3 pg — ABNORMAL LOW (ref 26.0–34.0)
MCHC: 33.1 g/dL (ref 30.0–36.0)
MCV: 76.3 fL — AB (ref 78.0–100.0)
Platelets: 278 10*3/uL (ref 150–400)
RBC: 4.59 MIL/uL (ref 3.87–5.11)
RDW: 15 % (ref 11.5–15.5)
WBC: 7.4 10*3/uL (ref 4.0–10.5)

## 2015-11-11 LAB — GLUCOSE, CAPILLARY
GLUCOSE-CAPILLARY: 139 mg/dL — AB (ref 65–99)
GLUCOSE-CAPILLARY: 197 mg/dL — AB (ref 65–99)
Glucose-Capillary: 143 mg/dL — ABNORMAL HIGH (ref 65–99)

## 2015-11-11 LAB — URINALYSIS, ROUTINE W REFLEX MICROSCOPIC
Bilirubin Urine: NEGATIVE
GLUCOSE, UA: 250 mg/dL — AB
HGB URINE DIPSTICK: NEGATIVE
KETONES UR: 40 mg/dL — AB
LEUKOCYTES UA: NEGATIVE
Nitrite: NEGATIVE
PH: 6 (ref 5.0–8.0)
Protein, ur: NEGATIVE mg/dL
Specific Gravity, Urine: >1.03 — ABNORMAL HIGH (ref 1.005–1.030)

## 2015-11-11 LAB — TYPE AND SCREEN
ABO/RH(D): B POS
Antibody Screen: NEGATIVE

## 2015-11-11 SURGERY — Surgical Case
Anesthesia: Regional | Site: Abdomen

## 2015-11-11 MED ORDER — OXYTOCIN 10 UNIT/ML IJ SOLN
40.0000 [IU] | INTRAVENOUS | Status: DC | PRN
  Administered 2015-11-11: 40 [IU] via INTRAVENOUS

## 2015-11-11 MED ORDER — ACETAMINOPHEN 500 MG PO TABS
1000.0000 mg | ORAL_TABLET | Freq: Four times a day (QID) | ORAL | Status: AC
  Administered 2015-11-12: 1000 mg via ORAL
  Filled 2015-11-11 (×2): qty 2

## 2015-11-11 MED ORDER — ONDANSETRON HCL 4 MG/2ML IJ SOLN: 4.0000 mg | Freq: Once | INTRAMUSCULAR | Status: DC | PRN

## 2015-11-11 MED ORDER — NALBUPHINE HCL 10 MG/ML IJ SOLN
5.0000 mg | Freq: Once | INTRAMUSCULAR | Status: AC | PRN
  Administered 2015-11-11: 5 mg via SUBCUTANEOUS

## 2015-11-11 MED ORDER — DIPHENHYDRAMINE HCL 25 MG PO CAPS: 25.0000 mg | ORAL_CAPSULE | Freq: Four times a day (QID) | ORAL | Status: DC | PRN

## 2015-11-11 MED ORDER — NALBUPHINE HCL 10 MG/ML IJ SOLN: 5.0000 mg | INTRAMUSCULAR | Status: DC | PRN

## 2015-11-11 MED ORDER — INSULIN ASPART 100 UNIT/ML ~~LOC~~ SOLN
0.0000 [IU] | Freq: Three times a day (TID) | SUBCUTANEOUS | Status: DC
  Administered 2015-11-12 (×3): 3 [IU] via SUBCUTANEOUS
  Administered 2015-11-13 (×3): 2 [IU] via SUBCUTANEOUS

## 2015-11-11 MED ORDER — WITCH HAZEL-GLYCERIN EX PADS: 1.0000 "application " | MEDICATED_PAD | CUTANEOUS | Status: DC | PRN

## 2015-11-11 MED ORDER — OXYTOCIN 40 UNITS IN LACTATED RINGERS INFUSION - SIMPLE MED: 62.5000 mL/h | INTRAVENOUS | Status: AC

## 2015-11-11 MED ORDER — KETOROLAC TROMETHAMINE 30 MG/ML IJ SOLN
INTRAMUSCULAR | Status: AC
  Administered 2015-11-11: 30 mg via INTRAMUSCULAR
  Filled 2015-11-11: qty 1

## 2015-11-11 MED ORDER — OXYTOCIN 10 UNIT/ML IJ SOLN
INTRAMUSCULAR | Status: AC
  Filled 2015-11-11: qty 4

## 2015-11-11 MED ORDER — LANOLIN HYDROUS EX OINT: 1.0000 "application " | TOPICAL_OINTMENT | CUTANEOUS | Status: DC | PRN

## 2015-11-11 MED ORDER — LACTATED RINGERS IV SOLN: INTRAVENOUS | Status: DC

## 2015-11-11 MED ORDER — LACTATED RINGERS IV SOLN: 500.0000 mL | INTRAVENOUS | Status: DC | PRN

## 2015-11-11 MED ORDER — DIPHENHYDRAMINE HCL 50 MG/ML IJ SOLN: 12.5000 mg | INTRAMUSCULAR | Status: DC | PRN

## 2015-11-11 MED ORDER — BUPIVACAINE HCL 0.5 % IJ SOLN
INTRAMUSCULAR | Status: DC | PRN
  Administered 2015-11-11: 20 mL

## 2015-11-11 MED ORDER — PHENYLEPHRINE HCL 10 MG/ML IJ SOLN
INTRAMUSCULAR | Status: DC | PRN
  Administered 2015-11-11: 80 ug via INTRAVENOUS

## 2015-11-11 MED ORDER — CITRIC ACID-SODIUM CITRATE 334-500 MG/5ML PO SOLN: 30.0000 mL | ORAL | Status: DC | PRN

## 2015-11-11 MED ORDER — CEFAZOLIN SODIUM-DEXTROSE 2-3 GM-% IV SOLR
2.0000 g | INTRAVENOUS | Status: AC
  Administered 2015-11-11: 2 g via INTRAVENOUS
  Filled 2015-11-11: qty 50

## 2015-11-11 MED ORDER — PHENYLEPHRINE 40 MCG/ML (10ML) SYRINGE FOR IV PUSH (FOR BLOOD PRESSURE SUPPORT)
PREFILLED_SYRINGE | INTRAVENOUS | Status: AC
  Filled 2015-11-11: qty 10

## 2015-11-11 MED ORDER — MORPHINE SULFATE (PF) 0.5 MG/ML IJ SOLN
INTRAMUSCULAR | Status: DC | PRN
  Administered 2015-11-11: .2 mg via INTRATHECAL

## 2015-11-11 MED ORDER — SIMETHICONE 80 MG PO CHEW
80.0000 mg | CHEWABLE_TABLET | ORAL | Status: DC
  Administered 2015-11-12 – 2015-11-13 (×3): 80 mg via ORAL
  Filled 2015-11-11 (×3): qty 1

## 2015-11-11 MED ORDER — FENTANYL CITRATE (PF) 100 MCG/2ML IJ SOLN
25.0000 ug | INTRAMUSCULAR | Status: DC | PRN
  Administered 2015-11-11: 25 ug via INTRAVENOUS
  Administered 2015-11-11: 50 ug via INTRAVENOUS

## 2015-11-11 MED ORDER — LIDOCAINE HCL (PF) 1 % IJ SOLN: 30.0000 mL | INTRAMUSCULAR | Status: DC | PRN

## 2015-11-11 MED ORDER — ALBUTEROL SULFATE (2.5 MG/3ML) 0.083% IN NEBU: 3.0000 mL | INHALATION_SOLUTION | Freq: Four times a day (QID) | RESPIRATORY_TRACT | Status: DC | PRN

## 2015-11-11 MED ORDER — DIBUCAINE 1 % RE OINT
1.0000 | TOPICAL_OINTMENT | RECTAL | Status: DC | PRN
Start: 2015-11-11 — End: 2015-11-14

## 2015-11-11 MED ORDER — MORPHINE SULFATE (PF) 0.5 MG/ML IJ SOLN
INTRAMUSCULAR | Status: AC
  Filled 2015-11-11: qty 10

## 2015-11-11 MED ORDER — ONDANSETRON HCL 4 MG/2ML IJ SOLN: 4.0000 mg | Freq: Four times a day (QID) | INTRAMUSCULAR | Status: DC | PRN

## 2015-11-11 MED ORDER — TETANUS-DIPHTH-ACELL PERTUSSIS 5-2.5-18.5 LF-MCG/0.5 IM SUSP: 0.5000 mL | Freq: Once | INTRAMUSCULAR | Status: DC

## 2015-11-11 MED ORDER — SCOPOLAMINE 1 MG/3DAYS TD PT72
1.0000 | MEDICATED_PATCH | Freq: Once | TRANSDERMAL | Status: AC
  Administered 2015-11-11: 1.5 mg via TRANSDERMAL

## 2015-11-11 MED ORDER — OXYCODONE-ACETAMINOPHEN 5-325 MG PO TABS
2.0000 | ORAL_TABLET | ORAL | Status: DC | PRN
  Administered 2015-11-12 – 2015-11-14 (×8): 2 via ORAL
  Filled 2015-11-11 (×8): qty 2

## 2015-11-11 MED ORDER — FENTANYL CITRATE (PF) 100 MCG/2ML IJ SOLN
INTRAMUSCULAR | Status: DC | PRN
  Administered 2015-11-11: 10 ug via INTRAVENOUS

## 2015-11-11 MED ORDER — KETOROLAC TROMETHAMINE 30 MG/ML IJ SOLN: 30.0000 mg | Freq: Four times a day (QID) | INTRAMUSCULAR | Status: AC | PRN

## 2015-11-11 MED ORDER — OXYCODONE-ACETAMINOPHEN 5-325 MG PO TABS
1.0000 | ORAL_TABLET | ORAL | Status: DC | PRN
  Administered 2015-11-12 – 2015-11-13 (×2): 1 via ORAL
  Filled 2015-11-11 (×2): qty 1

## 2015-11-11 MED ORDER — METFORMIN HCL ER 500 MG PO TB24
1000.0000 mg | ORAL_TABLET | Freq: Every day | ORAL | Status: DC
  Administered 2015-11-12 – 2015-11-14 (×3): 1000 mg via ORAL
  Filled 2015-11-11 (×3): qty 2

## 2015-11-11 MED ORDER — PRENATAL MULTIVITAMIN CH
1.0000 | ORAL_TABLET | Freq: Every day | ORAL | Status: DC
  Administered 2015-11-12 – 2015-11-14 (×3): 1 via ORAL
  Filled 2015-11-11 (×3): qty 1

## 2015-11-11 MED ORDER — BUPIVACAINE IN DEXTROSE 0.75-8.25 % IT SOLN
INTRATHECAL | Status: DC | PRN
  Administered 2015-11-11: 1.6 mg via INTRATHECAL

## 2015-11-11 MED ORDER — FAMOTIDINE IN NACL 20-0.9 MG/50ML-% IV SOLN
20.0000 mg | Freq: Once | INTRAVENOUS | Status: AC
  Administered 2015-11-11: 20 mg via INTRAVENOUS
  Filled 2015-11-11: qty 50

## 2015-11-11 MED ORDER — NALBUPHINE HCL 10 MG/ML IJ SOLN: 5.0000 mg | Freq: Once | INTRAMUSCULAR | Status: AC | PRN

## 2015-11-11 MED ORDER — ACETAMINOPHEN 325 MG PO TABS: 650.0000 mg | ORAL_TABLET | ORAL | Status: DC | PRN

## 2015-11-11 MED ORDER — PHENYLEPHRINE 8 MG IN D5W 100 ML (0.08MG/ML) PREMIX OPTIME
INJECTION | INTRAVENOUS | Status: DC | PRN
  Administered 2015-11-11: 60 ug/min via INTRAVENOUS

## 2015-11-11 MED ORDER — MEPERIDINE HCL 25 MG/ML IJ SOLN: 6.2500 mg | INTRAMUSCULAR | Status: DC | PRN

## 2015-11-11 MED ORDER — MENTHOL 3 MG MT LOZG: 1.0000 | LOZENGE | OROMUCOSAL | Status: DC | PRN

## 2015-11-11 MED ORDER — SODIUM CHLORIDE 0.9 % IR SOLN
Status: DC | PRN
  Administered 2015-11-11: 1

## 2015-11-11 MED ORDER — ONDANSETRON HCL 4 MG/2ML IJ SOLN
INTRAMUSCULAR | Status: DC | PRN
  Administered 2015-11-11: 4 mg via INTRAVENOUS

## 2015-11-11 MED ORDER — SIMETHICONE 80 MG PO CHEW
80.0000 mg | CHEWABLE_TABLET | Freq: Three times a day (TID) | ORAL | Status: DC
  Administered 2015-11-12 – 2015-11-14 (×6): 80 mg via ORAL
  Filled 2015-11-11 (×7): qty 1

## 2015-11-11 MED ORDER — INSULIN ASPART 100 UNIT/ML ~~LOC~~ SOLN: 0.0000 [IU] | Freq: Every day | SUBCUTANEOUS | Status: DC

## 2015-11-11 MED ORDER — SENNOSIDES-DOCUSATE SODIUM 8.6-50 MG PO TABS
2.0000 | ORAL_TABLET | ORAL | Status: DC
  Administered 2015-11-12 – 2015-11-13 (×3): 2 via ORAL
  Filled 2015-11-11 (×3): qty 2

## 2015-11-11 MED ORDER — BUPIVACAINE IN DEXTROSE 0.75-8.25 % IT SOLN
INTRATHECAL | Status: AC
  Filled 2015-11-11: qty 2

## 2015-11-11 MED ORDER — ONDANSETRON HCL 4 MG/2ML IJ SOLN: 4.0000 mg | Freq: Three times a day (TID) | INTRAMUSCULAR | Status: DC | PRN

## 2015-11-11 MED ORDER — LACTATED RINGERS IV SOLN
INTRAVENOUS | Status: DC
  Administered 2015-11-11 (×3): via INTRAVENOUS

## 2015-11-11 MED ORDER — KETOROLAC TROMETHAMINE 30 MG/ML IJ SOLN
30.0000 mg | Freq: Four times a day (QID) | INTRAMUSCULAR | Status: AC | PRN
  Administered 2015-11-11: 30 mg via INTRAMUSCULAR

## 2015-11-11 MED ORDER — NALOXONE HCL 2 MG/2ML IJ SOSY: 1.0000 ug/kg/h | PREFILLED_SYRINGE | INTRAVENOUS | Status: DC | PRN

## 2015-11-11 MED ORDER — DIPHENHYDRAMINE HCL 25 MG PO CAPS: 25.0000 mg | ORAL_CAPSULE | ORAL | Status: DC | PRN

## 2015-11-11 MED ORDER — CITRIC ACID-SODIUM CITRATE 334-500 MG/5ML PO SOLN
30.0000 mL | Freq: Once | ORAL | Status: AC
  Administered 2015-11-11: 30 mL via ORAL
  Filled 2015-11-11: qty 15

## 2015-11-11 MED ORDER — NALBUPHINE HCL 10 MG/ML IJ SOLN
INTRAMUSCULAR | Status: AC
  Administered 2015-11-11: 5 mg via SUBCUTANEOUS
  Filled 2015-11-11: qty 1

## 2015-11-11 MED ORDER — NALOXONE HCL 0.4 MG/ML IJ SOLN: 0.4000 mg | INTRAMUSCULAR | Status: DC | PRN

## 2015-11-11 MED ORDER — IBUPROFEN 600 MG PO TABS
600.0000 mg | ORAL_TABLET | Freq: Four times a day (QID) | ORAL | Status: DC
  Administered 2015-11-12 – 2015-11-14 (×11): 600 mg via ORAL
  Filled 2015-11-11 (×11): qty 1

## 2015-11-11 MED ORDER — FENTANYL CITRATE (PF) 100 MCG/2ML IJ SOLN
INTRAMUSCULAR | Status: AC
  Filled 2015-11-11: qty 2

## 2015-11-11 MED ORDER — ONDANSETRON HCL 4 MG/2ML IJ SOLN
INTRAMUSCULAR | Status: AC
  Filled 2015-11-11: qty 2

## 2015-11-11 MED ORDER — FENTANYL CITRATE (PF) 100 MCG/2ML IJ SOLN
INTRAMUSCULAR | Status: AC
  Administered 2015-11-11: 50 ug via INTRAVENOUS
  Filled 2015-11-11: qty 2

## 2015-11-11 MED ORDER — OXYTOCIN BOLUS FROM INFUSION
500.0000 mL | INTRAVENOUS | Status: DC
Start: 2015-11-11 — End: 2015-11-11

## 2015-11-11 MED ORDER — INSULIN NPH (HUMAN) (ISOPHANE) 100 UNIT/ML ~~LOC~~ SUSP
12.0000 [IU] | Freq: Once | SUBCUTANEOUS | Status: DC
  Filled 2015-11-11: qty 10

## 2015-11-11 MED ORDER — LACTATED RINGERS IV BOLUS (SEPSIS): 1000.0000 mL | Freq: Once | INTRAVENOUS | Status: DC

## 2015-11-11 MED ORDER — PHENYLEPHRINE 8 MG IN D5W 100 ML (0.08MG/ML) PREMIX OPTIME
INJECTION | INTRAVENOUS | Status: AC
  Filled 2015-11-11: qty 100

## 2015-11-11 MED ORDER — SCOPOLAMINE 1 MG/3DAYS TD PT72
MEDICATED_PATCH | TRANSDERMAL | Status: AC
  Administered 2015-11-11: 1.5 mg via TRANSDERMAL
  Filled 2015-11-11: qty 1

## 2015-11-11 MED ORDER — SODIUM CHLORIDE 0.9 % IJ SOLN
INTRAMUSCULAR | Status: AC
  Filled 2015-11-11: qty 10

## 2015-11-11 MED ORDER — ZOLPIDEM TARTRATE 5 MG PO TABS: 5.0000 mg | ORAL_TABLET | Freq: Every evening | ORAL | Status: DC | PRN

## 2015-11-11 MED ORDER — LACTATED RINGERS IV SOLN
INTRAVENOUS | Status: DC
  Administered 2015-11-11: 21:00:00 via INTRAVENOUS

## 2015-11-11 MED ORDER — OXYTOCIN 40 UNITS IN LACTATED RINGERS INFUSION - SIMPLE MED
62.5000 mL/h | INTRAVENOUS | Status: DC
Start: 2015-11-11 — End: 2015-11-11

## 2015-11-11 MED ORDER — SODIUM CHLORIDE 0.9 % IJ SOLN: 3.0000 mL | INTRAMUSCULAR | Status: DC | PRN

## 2015-11-11 MED ORDER — SIMETHICONE 80 MG PO CHEW
80.0000 mg | CHEWABLE_TABLET | ORAL | Status: DC | PRN
  Administered 2015-11-14: 80 mg via ORAL

## 2015-11-11 SURGICAL SUPPLY — 42 items
ADAPTOR W/DBL PLUG 281-5352 (MISCELLANEOUS) ×3 IMPLANT
BENZOIN TINCTURE PRP APPL 2/3 (GAUZE/BANDAGES/DRESSINGS) ×3 IMPLANT
CATH ROBINSON RED A/P 16FR (CATHETERS) IMPLANT
CLAMP CORD UMBIL (MISCELLANEOUS) IMPLANT
CLIP FILSHIE TUBAL LIGA STRL (Clip) ×3 IMPLANT
CLOSURE WOUND 1/2 X4 (GAUZE/BANDAGES/DRESSINGS) ×2
CLOTH BEACON ORANGE TIMEOUT ST (SAFETY) ×3 IMPLANT
DECANTER SPIKE VIAL GLASS SM (MISCELLANEOUS) ×3 IMPLANT
DRAPE SHEET LG 3/4 BI-LAMINATE (DRAPES) IMPLANT
DRSG OPSITE POSTOP 4X10 (GAUZE/BANDAGES/DRESSINGS) ×3 IMPLANT
DURAPREP 26ML APPLICATOR (WOUND CARE) ×3 IMPLANT
ELECT REM PT RETURN 9FT ADLT (ELECTROSURGICAL) ×3
ELECTRODE REM PT RTRN 9FT ADLT (ELECTROSURGICAL) ×1 IMPLANT
EXTRACTOR VACUUM KIWI (MISCELLANEOUS) ×3 IMPLANT
EXTRACTOR VACUUM M CUP 4 TUBE (SUCTIONS) IMPLANT
EXTRACTOR VACUUM M CUP 4' TUBE (SUCTIONS)
GLOVE BIOGEL PI IND STRL 7.0 (GLOVE) ×1 IMPLANT
GLOVE BIOGEL PI IND STRL 7.5 (GLOVE) ×2 IMPLANT
GLOVE BIOGEL PI INDICATOR 7.0 (GLOVE) ×2
GLOVE BIOGEL PI INDICATOR 7.5 (GLOVE) ×4
GLOVE ECLIPSE 7.5 STRL STRAW (GLOVE) ×3 IMPLANT
GOWN STRL REUS W/TWL LRG LVL3 (GOWN DISPOSABLE) ×9 IMPLANT
KIT ABG SYR 3ML LUER SLIP (SYRINGE) IMPLANT
NEEDLE HYPO 25X5/8 SAFETYGLIDE (NEEDLE) IMPLANT
NS IRRIG 1000ML POUR BTL (IV SOLUTION) ×3 IMPLANT
PACK C SECTION WH (CUSTOM PROCEDURE TRAY) ×3 IMPLANT
PAD ABD 7.5X8 STRL (GAUZE/BANDAGES/DRESSINGS) ×3 IMPLANT
PAD OB MATERNITY 4.3X12.25 (PERSONAL CARE ITEMS) ×3 IMPLANT
PENCIL SMOKE EVAC W/HOLSTER (ELECTROSURGICAL) ×3 IMPLANT
RTRCTR C-SECT PINK 25CM LRG (MISCELLANEOUS) ×3 IMPLANT
SPONGE DRAIN TRACH 4X4 STRL 2S (GAUZE/BANDAGES/DRESSINGS) ×3 IMPLANT
SPONGE GAUZE 4X4 12PLY STER LF (GAUZE/BANDAGES/DRESSINGS) ×3 IMPLANT
STRIP CLOSURE SKIN 1/2X4 (GAUZE/BANDAGES/DRESSINGS) ×4 IMPLANT
SUT MNCRL 0 VIOLET CTX 36 (SUTURE) IMPLANT
SUT MONOCRYL 0 CTX 36 (SUTURE)
SUT VIC AB 0 CTX 36 (SUTURE) ×6
SUT VIC AB 0 CTX36XBRD ANBCTRL (SUTURE) ×3 IMPLANT
SUT VIC AB 2-0 CT1 27 (SUTURE) ×2
SUT VIC AB 2-0 CT1 TAPERPNT 27 (SUTURE) ×1 IMPLANT
SUT VIC AB 4-0 KS 27 (SUTURE) ×6 IMPLANT
TOWEL OR 17X24 6PK STRL BLUE (TOWEL DISPOSABLE) ×3 IMPLANT
TRAY FOLEY CATH SILVER 14FR (SET/KITS/TRAYS/PACK) ×3 IMPLANT

## 2015-11-11 NOTE — Transfer of Care (Signed)
Immediate Anesthesia Transfer of Care Note  Patient: Carmen Watts  Procedure(s) Performed: Procedure(s): CESAREAN SECTION (N/A)  Patient Location: PACU  Anesthesia Type:Spinal  Level of Consciousness: awake  Airway & Oxygen Therapy: Patient Spontanous Breathing  Post-op Assessment: Report given to RN  Post vital signs: Reviewed and stable  Last Vitals:  Filed Vitals:   11/11/15 0944  BP: 120/56  Pulse: 101  Temp: 36.3 C  Resp: 20    Complications: No apparent anesthesia complications

## 2015-11-11 NOTE — MAU Note (Signed)
Pt sent from MFM with severe lower abd & lower back pain - "feels like my incision is being ripped apart from the inside out."  Denies bleeding or LOF.  Previous C/S x 3.

## 2015-11-11 NOTE — MAU Note (Signed)
Notified provider that patient's ulltrasound report was done. Provider said he would be done to see the patient.

## 2015-11-11 NOTE — Anesthesia Postprocedure Evaluation (Signed)
Anesthesia Post Note  Patient: Carmen Watts  Procedure(s) Performed: Procedure(s) (LRB): CESAREAN SECTION (N/A)  Patient location during evaluation: PACU Anesthesia Type: Combined Spinal/Epidural Level of consciousness: awake and alert Pain management: pain level controlled Vital Signs Assessment: post-procedure vital signs reviewed and stable Respiratory status: spontaneous breathing, nonlabored ventilation, respiratory function stable and patient connected to nasal cannula oxygen Cardiovascular status: blood pressure returned to baseline and stable Postop Assessment: no signs of nausea or vomiting, patient able to bend at knees and spinal receding Anesthetic complications: no    Last Vitals:  Filed Vitals:   11/11/15 1615 11/11/15 1617  BP:    Pulse: 68 68  Temp:    Resp: 23 23    Last Pain:  Filed Vitals:   11/11/15 1619  PainSc: 0-No pain                 Remmie Bembenek JENNETTE

## 2015-11-11 NOTE — Op Note (Signed)
Cesarean Section Operative Report  Carmen Watts  11/11/2015  Indications: category 2 fetal heart tracing, latent labor  Pre-operative Diagnosis: REPEAT CESAREAN SECTION, bilateral tubal ligation with Filshie clips  Post-operative Diagnosis: Same   Surgeon: Surgeon(s) and Role:    * Emeterio Reeve, MD - Primary    * Laurey Arrow, MD - assisting  Attending Attestation: I was present and scrubbed for the entire procedure.   Anesthesia: spinal    Estimated Blood Loss: 800 ml  Total IV Fluids: 2500 ml LR  Urine Output:: 150 ml clear yellow urine  Specimens: none  Findings: Viable female infant in cephalic presentation; Apgars 7/8; weight 3470 g; arterial cord pH not obtained; copious clear amniotic fluid; intact placenta with three vessel cord; normal uterus, fallopian tubes and ovaries bilaterally. No significant adhesive disease encountered.  Baby condition / location:  Couplet care / Skin to Skin   Complications: no complications  Indications: Carmen Watts is a 34 y.o. [id number] with an IUP [redacted]w[redacted]d presenting with 2-3 days painful contractions. Not found to make significant cervical change after 2 hours observation, but with frequent contractions and category 2 fetal heart tracing. Risks benefits of early term c/s in setting of category 2 FHT discussed with patient, and she very strongly desired to proceed with c/s.  The risks, benefits, complications, treatment options, and expected outcomes were discussed with the patient  The patient concurred with the proposed plan, giving informed consent. identified as Carmen Watts and the procedure verified as C-Section Delivery.  Procedure Details:  The patient was taken back to the operative suite where spinal anesthesia was placed.  A time out was held and the above information confirmed.   After induction of anesthesia, the patient was draped and prepped in the usual sterile manner and placed in a dorsal supine position with a leftward  tilt. A Pfannenstiel incision was made and carried down through the subcutaneous tissue to the fascia. Fascial incision was made and sharply extended transversely. The fascia was separated from the underlying rectus tissue superiorly and inferiorly. The peritoneum was identified and sharply entered and extended longitudinally. Alexis retractor was placed. Bladder flap created. A low transverse uterine incision was made and extended bluntly. Delivered from cephalic presentation with vacuum assistance (no pop-offs) was a viable infant with Apgars and weight as above. The umbilical cord was clamped and cut cord blood was obtained for evaluation. Cord ph was not sent. The placenta was removed Intact and appeared normal. The uterine outline, tubes and ovaries appeared normal}. The uterine incision was closed with running locked sutures of 0Vicryl with an imbricating layer of the same.   Hemostasis was observed after placement of one figure-of-eight 0 vicryl suture and bovie cautery. The peritoneum was closed with 0 vicryl. The rectus muscles were examined and hemostasis observed. The fascia was then reapproximated with running sutures of 0Vicryl. The subcuticular closure was performed using 2-0plain gut. The skin was closed with 4-0Vicryl. 20 ml 0.5% marcaine infused subcutaneously prior to skin closure.  Instrument, sponge, and needle counts were correct prior the abdominal closure and were correct at the conclusion of the case.     Disposition: PACU - hemodynamically stable.   Maternal Condition: stable       Signed: Ennis Forts 11/11/2015 2:46 PM

## 2015-11-11 NOTE — Anesthesia Preprocedure Evaluation (Signed)
Anesthesia Evaluation  Patient identified by MRN, date of birth, ID band Patient awake    Reviewed: Allergy & Precautions, NPO status , Patient's Chart, lab work & pertinent test results  History of Anesthesia Complications Negative for: history of anesthetic complications  Airway Mallampati: II  TM Distance: >3 FB Neck ROM: Full    Dental no notable dental hx. (+) Dental Advisory Given   Pulmonary asthma ,    Pulmonary exam normal breath sounds clear to auscultation       Cardiovascular negative cardio ROS Normal cardiovascular exam Rhythm:Regular Rate:Normal     Neuro/Psych  Headaches, negative psych ROS   GI/Hepatic negative GI ROS, Neg liver ROS,   Endo/Other  diabetes, Type 2, Insulin DependentMorbid obesity  Renal/GU negative Renal ROS  negative genitourinary   Musculoskeletal negative musculoskeletal ROS (+)   Abdominal   Peds negative pediatric ROS (+)  Hematology negative hematology ROS (+)   Anesthesia Other Findings   Reproductive/Obstetrics (+) Pregnancy Prior C/S x3                             Anesthesia Physical Anesthesia Plan  ASA: III  Anesthesia Plan: Combined Spinal and Epidural   Post-op Pain Management:    Induction:   Airway Management Planned:   Additional Equipment:   Intra-op Plan:   Post-operative Plan:   Informed Consent: I have reviewed the patients History and Physical, chart, labs and discussed the procedure including the risks, benefits and alternatives for the proposed anesthesia with the patient or authorized representative who has indicated his/her understanding and acceptance.   Dental advisory given  Plan Discussed with: CRNA  Anesthesia Plan Comments:         Anesthesia Quick Evaluation

## 2015-11-11 NOTE — Progress Notes (Signed)
NST Reactive, though has a deceleration. Patient sent to MAU for prolonged monitoring.

## 2015-11-11 NOTE — Anesthesia Procedure Notes (Signed)
Epidural Patient location during procedure: OB  Staffing Anesthesiologist: Josia Cueva Performed by: anesthesiologist   Preanesthetic Checklist Completed: patient identified, site marked, surgical consent, pre-op evaluation, timeout performed, IV checked, risks and benefits discussed and monitors and equipment checked  Epidural Patient position: sitting Prep: site prepped and draped and DuraPrep Patient monitoring: continuous pulse ox and blood pressure Approach: midline Location: L3-L4 Injection technique: LOR saline  Needle:  Needle type: Tuohy  Needle gauge: 17 G Needle length: 9 cm and 9 Needle insertion depth: 7 cm Catheter type: closed end flexible Catheter size: 19 Gauge Catheter at skin depth: 12 cm Test dose: negative  Assessment Events: blood not aspirated, injection not painful, no injection resistance, negative IV test and no paresthesia  Additional Notes Patient identified. Risks/Benefits/Options discussed with patient including but not limited to bleeding, infection, nerve damage, paralysis, failed block, incomplete pain control, headache, blood pressure changes, nausea, vomiting, reactions to medication both or allergic, itching and postpartum back pain. Confirmed with bedside nurse the patient's most recent platelet count. Confirmed with patient that they are not currently taking any anticoagulation, have any bleeding history or any family history of bleeding disorders. Patient expressed understanding and wished to proceed. All questions were answered. Sterile technique was used throughout the entire procedure. Please see nursing notes for vital signs. Test dose was given through epidural catheter and negative prior to continuing to dose epidural or start infusion.   This was a CSE. After LOR with tuohy, a 27ga sprotte was advanced and clear csf return with no paresthesia or heme. Bupivacaine injected and then sprottte removed and epidural catheter placed.

## 2015-11-11 NOTE — Progress Notes (Signed)
Pt continues to report burning and "tearing" sensation @ incision and that her abdomen never seems to be fully relaxed. She states that UC's are more uncomfortable - pt observed to be moaning and have labored breathing during UC's. Discussed pt c/o and NST results w/Dr. Nehemiah Settle - orders received for pt to go to MAU for further evaluation.

## 2015-11-11 NOTE — H&P (Signed)
LABOR AND DELIVERY ADMISSION HISTORY AND PHYSICAL NOTE  Carmen Watts is a 34 y.o. female [phone] with IUP at 46w2dby 5 wk u/s presenting for abdominal pain.  Lower abdominal, presente for 2-3 weeks, with worsening past 2-3 days. Comes and goes. Feels like "insides are ripping." No vaginal bleeding, no LOF, no dysuria or fevers/chills. Feeling baby move.    Prenatal History/Complications:  Past Medical History: Past Medical History  Diagnosis Date  . Asthma   . Ovarian cyst   . Gonorrhea   . Diabetes mellitus   . Chlamydia   . HJSRPRXYV(859.2     Past Surgical History: Past Surgical History  Procedure Laterality Date  . Cesarean section      x3  . Therapeutic abortion      Obstetrical History: OB History    Gravida Para Term Preterm AB TAB SAB Ectopic Multiple Living   6 3 3  0 2 2 0 0 0 3      Social History: Social History   Social History  . Marital Status: Single    Spouse Name: N/A  . Number of Children: N/A  . Years of Education: N/A   Social History Main Topics  . Smoking status: Never Smoker   . Smokeless tobacco: Never Used  . Alcohol Use: No     Comment: Socially before pregnancy  . Drug Use: No  . Sexual Activity: Yes    Birth Control/ Protection: None   Other Topics Concern  . None   Social History Narrative    Family History: Family History  Problem Relation Age of Onset  . Thyroid disease Mother   . Diabetes Father   . Hypertension Father   . Ovarian cancer Maternal Grandmother   . Breast cancer Maternal Grandmother   . Cancer Maternal Grandmother   . Cancer Maternal Grandfather   . Lung cancer Maternal Grandfather   . Asthma Son     Allergies: Allergies  Allergen Reactions  . Other Shortness Of Breath and Itching    Fresh fruit    Prescriptions prior to admission  Medication Sig Dispense Refill Last Dose  . ACCU-CHEK FASTCLIX LANCETS MISC Inject 1 each into the skin 4 (four) times daily. 648.83 for testing 4 times daily  102 each 12 11/10/2015  . aspirin EC 81 MG tablet Take 1 tablet (81 mg total) by mouth daily. Start after [redacted] weeks gestation 90 tablet 3 11/10/2015 at Unknown time  . Blood Glucose Monitoring Suppl (ACCU-CHEK NANO SMARTVIEW) W/DEVICE KIT 1 Units by Does not apply route 3 (three) times daily. 1 kit 0 11/10/2015  . calcium carbonate (TUMS - DOSED IN MG ELEMENTAL CALCIUM) 500 MG chewable tablet Chew 3 tablets by mouth daily as needed for heartburn.    11/10/2015 at Unknown time  . glucose blood (ACCU-CHEK ACTIVE STRIPS) test strip Use as instructed 1 each 12 11/10/2015  . glucose blood test strip Use as instructed 120 each 12 11/10/2015  . insulin aspart (NOVOLOG FLEXPEN) 100 UNIT/ML FlexPen Inject 24-26 Units into the skin 3 (three) times daily with meals. 24 units breakfast and lunch and 26 u dinner 15 mL 11 11/10/2015 at Unknown time  . insulin NPH Human (HUMULIN N,NOVOLIN N) 100 UNIT/ML injection Take 35 units in the morning and 40 units before bedtime. 10 mL 11 11/10/2015 at Unknown time  . Insulin Pen Needle (BD PEN NEEDLE NANO U/F) 32G X 4 MM MISC 1 applicator by Does not apply route 2 (two) times daily before lunch  and supper. 30 each 11 11/10/2015  . albuterol (PROVENTIL HFA;VENTOLIN HFA) 108 (90 BASE) MCG/ACT inhaler Inhale 2 puffs into the lungs every 6 (six) hours as needed for shortness of breath. For asthma syptoms   prn  . nystatin cream (MYCOSTATIN) Apply 1 application topically daily as needed for dry skin.   0 prn  . traMADol-acetaminophen (ULTRACET) 37.5-325 MG tablet Take 1 tablet by mouth every 6 (six) hours as needed. (Patient not taking: Reported on 11/07/2015) 15 tablet 0 Not Taking at Unknown time     Review of Systems   All systems reviewed and negative except as stated in HPI  Blood pressure 120/56, pulse 101, temperature 97.4 F (36.3 C), temperature source Oral, resp. rate 20, last menstrual period 01/02/2015, unknown if currently breastfeeding. General appearance:  alert, cooperative and appears stated age Lungs: clear to auscultation bilaterally Heart: regular rate and rhythm Abdomen: soft, non-tender; bowel sounds normal Extremities: No calf swelling or tenderness Presentation: cephalic Fetal monitoring: 140/mod/+a/one late and one variable decel Uterine activity: irritable  Dilation: 1.5 Effacement (%): 50 Station: -3 Exam by:: Dr. Si Raider    Prenatal labs: ABO, Rh: --/--/B POS (05/18 1453) Antibody: NEG (06/13 1229) Rubella: !Error!immune RPR: NON REAC (10/24 1008)  HBsAg: NEGATIVE (06/13 1229)  HIV: NONREACTIVE (10/24 1008)  GBS:   pos 1 hr Glucola: n/a Genetic screening:  First wnl Anatomy US: wnl  Prenatal Transfer Tool  Maternal Diabetes: Yes:  Diabetes Type:  Pre-pregnancy, Insulin/Medication controlled Genetic Screening: Normal Maternal Ultrasounds/Referrals: Normal Fetal Ultrasounds or other Referrals:  Fetal echo Maternal Substance Abuse:  No Significant Maternal Medications:  Insulin, aspirin Significant Maternal Lab Results: Lab values include: Group B Strep positive  No results found for this or any previous visit (from the past 24 hour(s)).  Patient Active Problem List   Diagnosis Date Noted  . Polyhydramnios 10/12/2015  . Diabetes mellitus during pregnancy, antepartum 04/26/2014  . Supervision of high risk pregnancy, antepartum 04/26/2014  . Previous cesarean delivery, antepartum condition or complication [id number]/[id number]  . RUQ pain 07/28/2012  . 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 Neuropathic pain 10/17/2011    Assessment: Carmen Watts is a 34 y.o. [id number] at 38w2dhere for abdominal pain. Pt concerned for uterine rupture but low concern for this as no significant abnormalities on fetal heart tracing, no vaginal bleeding, and u/s without evidence of rupture. However, FHT is category 2, with one variable and one late decel. BPP is 8/8 with uterine window noted and persistent polyhydramnios. Patient  strongly desires c/s. Given category 2 fetal heart tracing, BDM, and polyhydramnios, think is reasonable to proceed with c/s as patient is now early term.   #Labor: admit, npo since last night per patient, cefazolin 2 g, f/u cbc #Pain: spinal #FWB: Cat 2, bpp 8/8, overall reassuring #ID:  gbs pos, no indication for ppx at this time #MOF: breast #MOC: btl at time of c/s #Circ:  N/a #BDM: will check f/s prior to c/s and give insulin if indicated   NDesma Maxim12/23/2016, 12:50 PM

## 2015-11-11 NOTE — Progress Notes (Signed)
Spoke with Dr. Levada Dy about blood sugar being 139, before first meal after surgery. Sliding scale meal insulin starts in the morning with breakfast, per Dr. Levada Dy, recheck sugar at bedtime and give needed sliding scale insulin at bedtime. Carmen Watts

## 2015-11-11 NOTE — Lactation Note (Signed)
This note was copied from the chart of Carmen Watts. Lactation Consultation Note Initial visit at 8 hours of age.  Mom reports being sleepy recovering from c/s.  Baby asleep in crib and FOB just returned to room.  LC provided basic breastfeeding information and mom will need a review when she is more awake and alert.  FOB is supportive and denies further questions.  Mom is able to return demonstration of hand expression with colostrum visible.  Encouraged STS with feedings.  [facility] LC resources given and discussed.  Encouraged to feed with early cues on demand.  Early newborn behavior discussed.   Mom to call for assist as needed.     Patient Name: Carmen Watts [id number] Date: 11/11/2015 Reason for consult: Initial assessment   Maternal Data Has patient been taught Hand Expression?: Yes Does the patient have breastfeeding experience prior to this delivery?: No  Feeding Feeding Type: Breast Fed Length of feed: 10 min (off &on sucking with alot of stimulation/ see progress notes)  LATCH Score/Interventions Latch: Repeated attempts needed to sustain latch, nipple held in mouth throughout feeding, stimulation needed to elicit sucking reflex.  Audible Swallowing: A few with stimulation  Type of Nipple: Everted at rest and after stimulation  Comfort (Breast/Nipple): Soft / non-tender     Hold (Positioning): Full assist, staff holds infant at breast (mom falls asleep breastfeeding)  LATCH Score: 6  Lactation Tools Discussed/Used     Consult Status Consult Status: Follow-up Date: 11/12/15 Follow-up type: In-patient    Seanpatrick Maisano, Justine Null 11/11/2015, 10:35 PM

## 2015-11-12 LAB — CBC
HEMATOCRIT: 29.3 % — AB (ref 36.0–46.0)
HEMOGLOBIN: 9.6 g/dL — AB (ref 12.0–15.0)
MCH: 25.5 pg — ABNORMAL LOW (ref 26.0–34.0)
MCHC: 32.8 g/dL (ref 30.0–36.0)
MCV: 77.9 fL — AB (ref 78.0–100.0)
Platelets: 252 10*3/uL (ref 150–400)
RBC: 3.76 MIL/uL — ABNORMAL LOW (ref 3.87–5.11)
RDW: 15.2 % (ref 11.5–15.5)
WBC: 9.8 10*3/uL (ref 4.0–10.5)

## 2015-11-12 LAB — GLUCOSE, CAPILLARY
GLUCOSE-CAPILLARY: 180 mg/dL — AB (ref 65–99)
GLUCOSE-CAPILLARY: 174 mg/dL — AB (ref 65–99)
Glucose-Capillary: 146 mg/dL — ABNORMAL HIGH (ref 65–99)
Glucose-Capillary: 156 mg/dL — ABNORMAL HIGH (ref 65–99)

## 2015-11-12 LAB — RPR: RPR: NONREACTIVE

## 2015-11-12 MED ORDER — SUMATRIPTAN SUCCINATE 50 MG PO TABS
50.0000 mg | ORAL_TABLET | Freq: Once | ORAL | Status: AC
  Administered 2015-11-12: 50 mg via ORAL
  Filled 2015-11-12: qty 1

## 2015-11-12 NOTE — Progress Notes (Signed)
Subjective: Postpartum Day 1: Cesarean Delivery Patient reports incisional pain and tolerating PO.    Objective: Vital signs in last 24 hours: Temp:  [97.3 F (36.3 C)-97.9 F (36.6 C)] 97.9 F (36.6 C) (12/24 0321) Pulse Rate:  [60-123] 123 (12/24 0333) Resp:  [15-23] 20 (12/24 0333) BP: (97-130)/(42-98) 115/52 mmHg (12/24 0333) SpO2:  [96 %-100 %] 100 % (12/24 0327)  Physical Exam:  General: alert, cooperative and no distress  Heart rate RRR, no murmur Lungs CTAB Lochia: appropriate Uterine Fundus: firm Incision: healing well, no significant drainage DVT Evaluation: No evidence of DVT seen on physical exam.   Recent Labs  11/11/15 1249  HGB 11.6*  HCT 35.0*    Assessment/Plan: Status post Cesarean section. Doing well postoperatively.  Continue current care.  Carmen Watts 11/12/2015, 6:22 AM

## 2015-11-12 NOTE — Lactation Note (Signed)
This note was copied from the chart of Carmen Aeriell [place]. Lactation Consultation Note  Patient Name: Carmen Watts [id number] Date: 11/12/2015 Reason for consult: Follow-up assessment Baby at 32 hr of life and mom has decided not to bf. She declined DEBP or manual expression demonstration. She stated that she is in pain and to ask FOB. FOB stated that it is better for him the baby just stays on formula. Discussed breast changes and encouraged mom to call for help if she changes her mind when she starts to feel better.    Maternal Data    Feeding    LATCH Score/Interventions                      Lactation Tools Discussed/Used     Consult Status Consult Status: Complete Date: 11/13/15 Follow-up type: In-patient    Denzil Hughes 11/12/2015, 10:18 PM

## 2015-11-12 NOTE — Lactation Note (Signed)
This note was copied from the chart of Carmen Sybella [place]. Lactation Consultation Note  Patient Name: Carmen Watts [id number] Date: 11/12/2015 Reason for consult: Follow-up assessment Mom was in the shower. MGM stated that mom is formula feeding right now but she might have questions about bf later when her milk comes in.   Maternal Data    Feeding Feeding Type: Bottle Fed - Formula Nipple Type: Slow - flow  LATCH Score/Interventions                      Lactation Tools Discussed/Used     Consult Status Consult Status: Follow-up Date: 11/13/15 Follow-up type: In-patient    Denzil Hughes 11/12/2015, 7:54 PM

## 2015-11-13 LAB — GLUCOSE, CAPILLARY
GLUCOSE-CAPILLARY: 134 mg/dL — AB (ref 65–99)
GLUCOSE-CAPILLARY: 126 mg/dL — AB (ref 65–99)
Glucose-Capillary: 152 mg/dL — ABNORMAL HIGH (ref 65–99)
Glucose-Capillary: 147 mg/dL — ABNORMAL HIGH (ref 65–99)

## 2015-11-13 MED ORDER — INSULIN GLARGINE 100 UNIT/ML ~~LOC~~ SOLN
10.0000 [IU] | Freq: Every day | SUBCUTANEOUS | Status: DC
  Administered 2015-11-13 – 2015-11-14 (×2): 10 [IU] via SUBCUTANEOUS
  Filled 2015-11-13 (×2): qty 0.1

## 2015-11-13 NOTE — Progress Notes (Signed)
POSTPARTUM PROGRESS NOTE  Post Partum Day 2 Subjective:  Carmen Watts is a 34 y.o. [id number] [redacted]w[redacted]d s/p rltcs with btl.  No acute events overnight.  Pt denies problems with ambulating, voiding or po intake.  She denies nausea or vomiting.  Pain is moderately controlled.  She has had flatus. She has not had bowel movement.  Lochia Small.   Objective: Blood pressure 106/54, pulse 65, temperature 99.5 F (37.5 C), temperature source Oral, resp. rate 16, last menstrual period 01/02/2015, SpO2 100 %, unknown if currently breastfeeding.  Physical Exam:  General: alert, cooperative and no distress Lochia:normal flow Chest: CTAB Heart: RRR no m/r/g Abdomen: +BS, soft, nontender,  Uterine Fundus: firm,  DVT Evaluation: No calf swelling or tenderness Extremities: trace edema   Recent Labs  11/11/15 1249 11/12/15 0545  HGB 11.6* 9.6*  HCT 35.0* 29.3*    Assessment/Plan:  ASSESSMENT: Carmen Watts is a 34 y.o. [id number] [redacted]w[redacted]d s/p rltcs and btl. Wants to stay an extra day. Required 9 units ssi yesterday; will continue ssi and start lantus 10 qd in addition to metformin.   Plan for discharge tomorrow   LOS: 2 days   Desma Maxim 11/13/2015, 7:05 AM

## 2015-11-14 ENCOUNTER — Encounter (HOSPITAL_COMMUNITY): Payer: Self-pay | Admitting: *Deleted

## 2015-11-14 DIAGNOSIS — Z98891 History of uterine scar from previous surgery: Secondary | ICD-10-CM

## 2015-11-14 MED ORDER — GLYCERIN (LAXATIVE) 2.1 G RE SUPP
1.0000 | Freq: Once | RECTAL | Status: AC
  Administered 2015-11-14: 10:00:00 via RECTAL
  Filled 2015-11-14: qty 1

## 2015-11-14 MED ORDER — IBUPROFEN 600 MG PO TABS: 600.0000 mg | ORAL_TABLET | Freq: Four times a day (QID) | ORAL | Status: DC

## 2015-11-14 MED ORDER — OXYCODONE-ACETAMINOPHEN 5-325 MG PO TABS: 1.0000 | ORAL_TABLET | ORAL | Status: DC | PRN

## 2015-11-14 MED ORDER — INSULIN GLARGINE 100 UNIT/ML ~~LOC~~ SOLN: 10.0000 [IU] | Freq: Every day | SUBCUTANEOUS | Status: DC

## 2015-11-14 MED ORDER — METFORMIN HCL ER (OSM) 1000 MG PO TB24: 1000.0000 mg | ORAL_TABLET | Freq: Every day | ORAL | Status: DC

## 2015-11-14 NOTE — Discharge Summary (Signed)
OB Discharge Summary     Patient Name: Carmen Watts DOB: Jul 31, 1981 MRN: [id number]  Date of admission: 11/11/2015 Delivering MD: Woodroe Mode   Date of discharge: 11/14/2015  Admitting diagnosis: 21WKS, ABD PAIN Intrauterine pregnancy: [redacted]w[redacted]d    Secondary diagnosis:  Principal Problem:   Status post cesarean delivery Active Problems:   Diabetes mellitus during pregnancy, antepartum   Supervision of high risk pregnancy, antepartum   Previous cesarean delivery, antepartum condition or complication   Polyhydramnios   History of cesarean delivery  Additional problems: None      Discharge diagnosis: Term Pregnancy Delivered                                                                                                Post partum procedures:None  Augmentation: None   Complications: None  Hospital course:  Sceduled C/S   34y.o. yo [id number] admitted to the hospital 11/11/2015 for scheduled cesarean section with the following indication:Elective Repeat.  Membrane Rupture Time/Date: 2:00 PM ,11/11/2015   Patient delivered a Viable infant.11/11/2015  Details of operation can be found in separate operative note.  Pateint had an uncomplicated postpartum course.  She is ambulating, tolerating a regular diet, passing flatus, and urinating well. Patient is discharged home in stable condition on 11/14/2015    Physical exam  Filed Vitals:   11/12/15 1815 11/13/15 0602 11/13/15 1900 11/14/15 0435  BP: 121/70 106/54 124/55 97/60  Pulse: 80 65 63 56  Temp: 98.4 F (36.9 C) 99.5 F (37.5 C) 98.2 F (36.8 C) 97.4 F (36.3 C)  TempSrc: Oral Oral Oral Axillary  Resp: _0 SpO2:   100%    General: alert, cooperative and no distress Lochia: appropriate Uterine Fundus: firm Incision: Dressing is clean, dry, and intact DVT Evaluation: No evidence of DVT seen on physical exam. Labs: Lab Results  Component Value Date   WBC 9.8 11/12/2015   HGB 9.6*  11/12/2015   HCT 29.3* 11/12/2015   MCV 77.9* 11/12/2015   PLT 252 11/12/2015   CMP Latest Ref Rng 07/05/2015  Glucose 65 - 99 mg/dL 130(H)  BUN 6 - 20 mg/dL 8  Creatinine 0.44 - 1.00 mg/dL 0.48  Sodium 135 - 145 mmol/L 134(L)  Potassium 3.5 - 5.1 mmol/L 3.7  Chloride 101 - 111 mmol/L 103  CO2 22 - 32 mmol/L 22  Calcium 8.9 - 10.3 mg/dL 9.1  Total Protein 6.5 - 8.1 g/dL 7.3  Total Bilirubin 0.3 - 1.2 mg/dL 0.6  Alkaline Phos 38 - 126 U/L 45  AST 15 - 41 U/L 12(L)  ALT 14 - 54 U/L 12(L)    Discharge instruction: per After Visit Summary and "Baby and Me Booklet".  After visit meds:    Medication List    STOP taking these medications        insulin NPH Human 100 UNIT/ML injection  Commonly known as:  HUMULIN N,NOVOLIN N     Insulin Pen Needle 32G X 4 MM Misc  Commonly known as:  BD PEN NEEDLE NANO U/F  traMADol-acetaminophen 37.5-325 MG tablet  Commonly known as:  ULTRACET      TAKE these medications        ACCU-CHEK FASTCLIX LANCETS Misc  Inject 1 each into the skin 4 (four) times daily. 648.83 for testing 4 times daily     ACCU-CHEK NANO SMARTVIEW W/DEVICE Kit  1 Units by Does not apply route 3 (three) times daily.     albuterol 108 (90 BASE) MCG/ACT inhaler  Commonly known as:  PROVENTIL HFA;VENTOLIN HFA  Inhale 2 puffs into the lungs every 6 (six) hours as needed for shortness of breath. For asthma syptoms     aspirin EC 81 MG tablet  Take 1 tablet (81 mg total) by mouth daily. Start after [redacted] weeks gestation     calcium carbonate 500 MG chewable tablet  Commonly known as:  TUMS - dosed in mg elemental calcium  Chew 3 tablets by mouth daily as needed for heartburn.     glucose blood test strip  Use as instructed     ibuprofen 600 MG tablet  Commonly known as:  ADVIL,MOTRIN  Take 1 tablet (600 mg total) by mouth every 6 (six) hours.     insulin aspart 100 UNIT/ML FlexPen  Commonly known as:  NOVOLOG FLEXPEN  Inject 24-26 Units into the skin 3  (three) times daily with meals. 24 units breakfast and lunch and 26 u dinner     insulin glargine 100 UNIT/ML injection  Commonly known as:  LANTUS  Inject 0.1 mLs (10 Units total) into the skin daily.     metformin 1000 MG (OSM) 24 hr tablet  Commonly known as:  FORTAMET  Take 1 tablet (1,000 mg total) by mouth daily with breakfast.     nystatin cream  Commonly known as:  MYCOSTATIN  Apply 1 application topically daily as needed for dry skin.     oxyCODONE-acetaminophen 5-325 MG tablet  Commonly known as:  PERCOCET/ROXICET  Take 1 tablet by mouth every 4 (four) hours as needed (for pain scale 4-7).        Diet: routine diet  Activity: Advance as tolerated. Pelvic rest for 6 weeks.   Outpatient follow up:6 weeks, 1 week follow up for diabetes  Follow up Appt:No future appointments. Follow up Visit:No Follow-up on file.  Postpartum contraception: Tubal Ligation  Newborn Data: Live born female  Birth Weight: 7 lb 10.4 oz (3470 g) APGAR: 7, 8  Baby Feeding: Breast Disposition:home with mother   11/14/2015 Lockie Pares, MD

## 2015-11-14 NOTE — Discharge Instructions (Signed)
Cesarean Delivery, Care After  Refer to this sheet in the next few weeks. These instructions provide you with information on caring for yourself after your procedure. Your health care provider may also give you specific instructions. Your treatment has been planned according to current medical practices, but problems sometimes occur. Call your health care provider if you have any problems or questions after you go home.  HOME CARE INSTRUCTIONS   Only take over-the-counter or prescription medications as directed by your health care provider.   Do not drink alcohol, especially if you are breastfeeding or taking medication to relieve pain.   Do not chew or smoke tobacco.   Continue to use good perineal care. Good perineal care includes:    Wiping your perineum from front to back.    Keeping your perineum clean.   Check your surgical cut (incision) daily for increased redness, drainage, swelling, or separation of skin.   Clean your incision gently with soap and water every day, and then pat it dry. If your health care provider says it is okay, leave the incision uncovered. Use a bandage (dressing) if the incision is draining fluid or appears irritated. If the adhesive strips across the incision do not fall off within 7 days, carefully peel them off.   Hug a pillow when coughing or sneezing until your incision is healed. This helps to relieve pain.   Do not use tampons or douche until your health care provider says it is okay.   Shower, wash your hair, and take tub baths as directed by your health care provider.   Wear a well-fitting bra that provides breast support.   Limit wearing support panties or control-top hose.   Drink enough fluids to keep your urine clear or pale yellow.   Eat high-fiber foods such as whole grain cereals and breads, brown rice, beans, and fresh fruits and vegetables every day. These foods may help prevent or relieve constipation.   Resume activities such as climbing stairs,  driving, lifting, exercising, or traveling as directed by your health care provider.   Talk to your health care provider about resuming sexual activities. This is dependent upon your risk of infection, your rate of healing, and your comfort and desire to resume sexual activity.   Try to have someone help you with your household activities and your newborn for at least a few days after you leave the hospital.   Rest as much as possible. Try to rest or take a nap when your newborn is sleeping.   Increase your activities gradually.   Keep all of your scheduled postpartum appointments. It is very important to keep your scheduled follow-up appointments. At these appointments, your health care provider will be checking to make sure that you are healing physically and emotionally.  SEEK MEDICAL CARE IF:    You are passing large clots from your vagina. Save any clots to show your health care provider.   You have a foul smelling discharge from your vagina.   You have trouble urinating.   You are urinating frequently.   You have pain when you urinate.   You have a change in your bowel movements.   You have increasing redness, pain, or swelling near your incision.   You have pus draining from your incision.   Your incision is separating.   You have painful, hard, or reddened breasts.   You have a severe headache.   You have blurred vision or see spots.   You feel sad   or depressed.   You have thoughts of hurting yourself or your newborn.   You have questions about your care, the care of your newborn, or medications.   You are dizzy or light-headed.   You have a rash.   You have pain, redness, or swelling at the site of the removed intravenous access (IV) tube.   You have nausea or vomiting.   You stopped breastfeeding and have not had a menstrual period within 12 weeks of stopping.   You are not breastfeeding and have not had a menstrual period within 12 weeks of delivery.   You have a fever.  SEEK  IMMEDIATE MEDICAL CARE IF:   You have persistent pain.   You have chest pain.   You have shortness of breath.   You faint.   You have leg pain.   You have stomach pain.   Your vaginal bleeding saturates 2 or more sanitary pads in 1 hour.  MAKE SURE YOU:    Understand these instructions.   Will watch your condition.   Will get help right away if you are not doing well or get worse.     This information is not intended to replace advice given to you by your health care provider. Make sure you discuss any questions you have with your health care provider.     Document Released: 07/28/2002 Document Revised: 11/26/2014 Document Reviewed: 07/02/2012  Elsevier Interactive Patient Education 2016 [employer]

## 2015-11-15 ENCOUNTER — Other Ambulatory Visit: Payer: Medicaid Other

## 2015-11-17 ENCOUNTER — Other Ambulatory Visit: Payer: Medicaid Other

## 2015-11-25 ENCOUNTER — Inpatient Hospital Stay (HOSPITAL_COMMUNITY)
Admission: AD | Admit: 2015-11-25 | Discharge: 2015-11-27 | DRG: 776 | Disposition: A | Discharge status: Home / Self Care | Payer: Medicaid Other | Source: Ambulatory Visit | Attending: Obstetrics and Gynecology | Admitting: Obstetrics and Gynecology

## 2015-11-25 ENCOUNTER — Encounter (HOSPITAL_COMMUNITY): Admission: RE | Payer: Self-pay | Source: Ambulatory Visit

## 2015-11-25 ENCOUNTER — Inpatient Hospital Stay (HOSPITAL_COMMUNITY)
Admission: RE | Admit: 2015-11-25 | Payer: Medicaid Other | Source: Ambulatory Visit | Admitting: Obstetrics and Gynecology

## 2015-11-25 ENCOUNTER — Encounter (HOSPITAL_COMMUNITY): Payer: Self-pay | Admitting: *Deleted

## 2015-11-25 ENCOUNTER — Encounter: Payer: Self-pay | Admitting: *Deleted

## 2015-11-25 ENCOUNTER — Telehealth: Payer: Self-pay | Admitting: *Deleted

## 2015-11-25 DIAGNOSIS — Z833 Family history of diabetes mellitus: Secondary | ICD-10-CM

## 2015-11-25 DIAGNOSIS — Z8249 Family history of ischemic heart disease and other diseases of the circulatory system: Secondary | ICD-10-CM

## 2015-11-25 DIAGNOSIS — O1495 Unspecified pre-eclampsia, complicating the puerperium: Secondary | ICD-10-CM | POA: Diagnosis present

## 2015-11-25 DIAGNOSIS — B951 Streptococcus, group B, as the cause of diseases classified elsewhere: Secondary | ICD-10-CM | POA: Insufficient documentation

## 2015-11-25 DIAGNOSIS — R51 Headache: Secondary | ICD-10-CM | POA: Diagnosis present

## 2015-11-25 HISTORY — DX: Essential (primary) hypertension: I10

## 2015-11-25 LAB — GLUCOSE, CAPILLARY: Glucose-Capillary: 161 mg/dL — ABNORMAL HIGH (ref 65–99)

## 2015-11-25 LAB — CBC
HCT: 37.5 % (ref 36.0–46.0)
Hemoglobin: 12.1 g/dL (ref 12.0–15.0)
MCH: 25.5 pg — AB (ref 26.0–34.0)
MCHC: 32.3 g/dL (ref 30.0–36.0)
MCV: 78.9 fL (ref 78.0–100.0)
Platelets: 401 10*3/uL — ABNORMAL HIGH (ref 150–400)
RBC: 4.75 MIL/uL (ref 3.87–5.11)
RDW: 15.3 % (ref 11.5–15.5)
WBC: 7.1 10*3/uL (ref 4.0–10.5)

## 2015-11-25 LAB — URINALYSIS, ROUTINE W REFLEX MICROSCOPIC
BILIRUBIN URINE: NEGATIVE
GLUCOSE, UA: NEGATIVE mg/dL
KETONES UR: NEGATIVE mg/dL
Leukocytes, UA: NEGATIVE
Nitrite: NEGATIVE
PROTEIN: NEGATIVE mg/dL
Specific Gravity, Urine: 1.01 (ref 1.005–1.030)
pH: 6 (ref 5.0–8.0)

## 2015-11-25 LAB — URINE MICROSCOPIC-ADD ON: BACTERIA UA: NONE SEEN

## 2015-11-25 LAB — COMPREHENSIVE METABOLIC PANEL
ALBUMIN: 3.4 g/dL — AB (ref 3.5–5.0)
ALT: 25 U/L (ref 14–54)
ANION GAP: 11 (ref 5–15)
AST: 22 U/L (ref 15–41)
Alkaline Phosphatase: 80 U/L (ref 38–126)
BILIRUBIN TOTAL: 0.2 mg/dL — AB (ref 0.3–1.2)
BUN: 10 mg/dL (ref 6–20)
CO2: 26 mmol/L (ref 22–32)
Calcium: 8.9 mg/dL (ref 8.9–10.3)
Chloride: 106 mmol/L (ref 101–111)
Creatinine, Ser: 0.53 mg/dL (ref 0.44–1.00)
GFR calc non Af Amer: >60 mL/min (ref >60)
GLUCOSE: 107 mg/dL — AB (ref 65–99)
POTASSIUM: 3.6 mmol/L (ref 3.5–5.1)
SODIUM: 143 mmol/L (ref 135–145)
TOTAL PROTEIN: 6.9 g/dL (ref 6.5–8.1)

## 2015-11-25 LAB — PROTEIN / CREATININE RATIO, URINE
Creatinine, Urine: 69 mg/dL
PROTEIN CREATININE RATIO: 0.13 mg/mg{creat} (ref 0.00–0.15)
TOTAL PROTEIN, URINE: 9 mg/dL

## 2015-11-25 SURGERY — Surgical Case
Anesthesia: Regional | Site: Abdomen | Laterality: Bilateral

## 2015-11-25 MED ORDER — CALCIUM CARBONATE ANTACID 500 MG PO CHEW: 2.0000 | CHEWABLE_TABLET | ORAL | Status: DC | PRN

## 2015-11-25 MED ORDER — MAGNESIUM SULFATE BOLUS VIA INFUSION
4.0000 g | Freq: Once | INTRAVENOUS | Status: AC
  Administered 2015-11-25: 4 g via INTRAVENOUS
  Filled 2015-11-25: qty 500

## 2015-11-25 MED ORDER — DOCUSATE SODIUM 100 MG PO CAPS
100.0000 mg | ORAL_CAPSULE | Freq: Every day | ORAL | Status: DC
  Administered 2015-11-26: 100 mg via ORAL
  Filled 2015-11-25 (×2): qty 1

## 2015-11-25 MED ORDER — HYDRALAZINE HCL 20 MG/ML IJ SOLN: 10.0000 mg | Freq: Once | INTRAMUSCULAR | Status: DC | PRN

## 2015-11-25 MED ORDER — INSULIN GLARGINE 100 UNIT/ML ~~LOC~~ SOLN
10.0000 [IU] | Freq: Every day | SUBCUTANEOUS | Status: DC
  Administered 2015-11-25 – 2015-11-26 (×2): 10 [IU] via SUBCUTANEOUS
  Filled 2015-11-25 (×2): qty 0.1

## 2015-11-25 MED ORDER — ACETAMINOPHEN 325 MG PO TABS
650.0000 mg | ORAL_TABLET | ORAL | Status: DC | PRN
  Administered 2015-11-25 – 2015-11-26 (×4): 650 mg via ORAL
  Filled 2015-11-25 (×5): qty 2

## 2015-11-25 MED ORDER — SODIUM CHLORIDE 0.9 % IV SOLN
INTRAVENOUS | Status: DC
  Administered 2015-11-25 – 2015-11-26 (×2): via INTRAVENOUS

## 2015-11-25 MED ORDER — ZOLPIDEM TARTRATE 5 MG PO TABS: 5.0000 mg | ORAL_TABLET | Freq: Every evening | ORAL | Status: DC | PRN

## 2015-11-25 MED ORDER — LABETALOL HCL 5 MG/ML IV SOLN
20.0000 mg | INTRAVENOUS | Status: DC | PRN
  Administered 2015-11-25: 20 mg via INTRAVENOUS
  Administered 2015-11-25: 40 mg via INTRAVENOUS
  Filled 2015-11-25: qty 8
  Filled 2015-11-25: qty 4

## 2015-11-25 MED ORDER — PRENATAL MULTIVITAMIN CH
1.0000 | ORAL_TABLET | Freq: Every day | ORAL | Status: DC
  Administered 2015-11-26: 1 via ORAL
  Filled 2015-11-25: qty 1

## 2015-11-25 MED ORDER — MAGNESIUM SULFATE 50 % IJ SOLN
2.0000 g/h | INTRAVENOUS | Status: DC
  Administered 2015-11-25 – 2015-11-26 (×2): 2 g/h via INTRAVENOUS
  Filled 2015-11-25 (×2): qty 80

## 2015-11-25 NOTE — MAU Note (Signed)
Pt had C/S on 12/23, was seen by home nurse on Tuesday, was told BP was elevated.  Has been taking her own BP @ home since then & it has been elevated.  Has HA, dizziness, states her chest feels heavy.

## 2015-11-25 NOTE — Telephone Encounter (Signed)
Received message from medical call center, they received call 11/25/15 @ 0743 from patient. She had c/s on 12/23 and complains of severe headache not responding to tylenol. On Tuesday this week a home nurse checked her BP and it was elevated. Last night BP was between 180/83 and 190/100. This morning 168/97. Was advised to go to the ED.   Attempted to call patient but her phone was not accepting calls. Called both her emergency contacts and left a message with her mother stating we are trying to contact the patient regarding her call this morning and to please have her call the clinics. Her second emergency contact was not accepting calls.

## 2015-11-25 NOTE — H&P (Signed)
Carmen Watts is a 35 y.o. female presenting for Hypertension, new onset with headache and visual changes.  Also has intermittent RUQ pain.  Pregnancy was remarkable for diabetes. Is currently taking Lantus 10 units QHS.  She had a C/S 2 weeks ago.   Maternal Medical History:  Reason for admission: Nausea. Hypertension, new, severe   Contractions: Htn noted on home visit  Fetal activity: Postpartum  Prenatal complications: No placental abnormality, pre-eclampsia or thrombocytopenia.   Prenatal Complications - Diabetes: gestational.    OB History    Gravida Para Term Preterm AB TAB SAB Ectopic Multiple Living   6 4 4  0 2 2 0 0 0 4     Past Medical History  Diagnosis Date  . Asthma   . Ovarian cyst   . Gonorrhea   . Diabetes mellitus   . Chlamydia   . Headache(784.0)   . Hypertension    Past Surgical History  Procedure Laterality Date  . Cesarean section      x3  . Therapeutic abortion    . Cesarean section N/A 11/11/2015    Procedure: CESAREAN SECTION;  Surgeon: Truett Mainland, DO;  Location: [facility];  Service: Obstetrics;  Laterality: N/A;   Family History: family history includes Asthma in her son; Breast cancer in her maternal grandmother; Cancer in her maternal grandfather and maternal grandmother; Diabetes in her father; Hypertension in her father; Lung cancer in her maternal grandfather; Ovarian cancer in her maternal grandmother; Thyroid disease in her mother. Social History:  reports that she has never smoked. She has never used smokeless tobacco. She reports that she does not drink alcohol or use illicit drugs.    Review of Systems  Constitutional: Negative for fever, chills and malaise/fatigue.  Eyes: Positive for blurred vision (seeing floaters).  Respiratory: Negative for shortness of breath (but chest feels "heavy").   Cardiovascular: Positive for leg swelling (new). Negative for chest pain.  Gastrointestinal: Negative for nausea, vomiting, abdominal pain,  diarrhea and constipation.  Musculoskeletal: Negative for back pain.  Neurological: Positive for headaches. Negative for dizziness, sensory change, speech change, focal weakness and weakness.      Blood pressure 175/95, pulse 67, temperature 98.2 F (36.8 C), temperature source Oral, resp. rate 18, last menstrual period 01/02/2015, SpO2 100 %, unknown if currently breastfeeding. Maternal Exam:  Uterine Assessment: Postpartum   Abdomen: Surgical scars: low transverse.   Introitus: Vaginal discharge: scant lochia.    Physical Exam  Constitutional: She is oriented to person, place, and time. She appears well-developed and well-nourished. No distress.  HENT:  Head: Normocephalic.  Neck: Normal range of motion. Neck supple.  Cardiovascular: Normal rate, regular rhythm and normal heart sounds.  Exam reveals no gallop and no friction rub.   No murmur heard. Respiratory: Effort normal and breath sounds normal. No respiratory distress (Pulse Oximetry 100%). She has no wheezes. She has no rales. She exhibits no tenderness.  GI: Soft. She exhibits no distension and no mass. There is tenderness (incisional). There is no rebound and no guarding.  Genitourinary: Vaginal discharge: scant lochia.  Musculoskeletal: Normal range of motion. She exhibits edema (1+).  Neurological: She is alert and oriented to person, place, and time. She displays normal reflexes. No cranial nerve deficit. She exhibits normal muscle tone (no clonus). Coordination normal.  Skin: Skin is warm and dry.  Psychiatric: She has a normal mood and affect.    Prenatal labs: ABO, Rh: --/--/B POS (12/23 1249) Antibody: NEG (12/23 1249) Rubella:  RPR: Non Reactive (12/23 1249)  HBsAg: NEGATIVE (06/13 1229)  HIV: NONREACTIVE (10/24 1008)  GBS:     Assessment/Plan: Postpartum x 2 weeks Preeclampsia Severe range blood pressures Headache  Plan:  Consulted Dr Glo Herring            Preeclampsia labs done            Hypertension protocol initiated          Magnesium sulfate x 24 hrs           Lantus insulin qhs    [facility] 11/25/2015, 6:32 PM

## 2015-11-26 DIAGNOSIS — O1495 Unspecified pre-eclampsia, complicating the puerperium: Principal | ICD-10-CM

## 2015-11-26 LAB — GLUCOSE, CAPILLARY
GLUCOSE-CAPILLARY: 124 mg/dL — AB (ref 65–99)
Glucose-Capillary: 146 mg/dL — ABNORMAL HIGH (ref 65–99)
Glucose-Capillary: 162 mg/dL — ABNORMAL HIGH (ref 65–99)
Glucose-Capillary: 165 mg/dL — ABNORMAL HIGH (ref 65–99)

## 2015-11-26 MED ORDER — CYCLOBENZAPRINE HCL 10 MG PO TABS
10.0000 mg | ORAL_TABLET | Freq: Once | ORAL | Status: AC
  Administered 2015-11-26: 10 mg via ORAL
  Filled 2015-11-26: qty 1

## 2015-11-26 MED ORDER — AMLODIPINE BESYLATE 5 MG PO TABS
5.0000 mg | ORAL_TABLET | Freq: Every day | ORAL | Status: DC
Start: 2015-11-26 — End: 2015-11-27
  Administered 2015-11-26 – 2015-11-27 (×2): 5 mg via ORAL
  Filled 2015-11-26 (×2): qty 1

## 2015-11-26 NOTE — Progress Notes (Signed)
Patient ID: Carmen Watts, female   DOB: 1981-10-27, 35 y.o.   MRN: [id number]   Subjective: Interval History:reports swelling is improved and she is urinating a lot since starting magnesium.  Objective: Vital signs in last 24 hours: Temp:  [97.5 F (36.4 C)-98.2 F (36.8 C)] 97.9 F (36.6 C) (01/07 0802) Pulse Rate:  [25-106] 88 (01/07 0902) Resp:  [18-20] 18 (01/07 0902) BP: (103-185)/(70-108) 145/80 mmHg (01/07 0902) SpO2:  [86 %-100 %] 100 % (01/07 0701) Weight:  [246 lb (111.585 kg)] 246 lb (111.585 kg) (01/06 1951)  Intake/Output from previous day: 01/06 0701 - 01/07 0700 In: 2071.3 [P.O.:1140; I.V.:931.3] Out: 2200 [Urine:2200] Intake/Output this shift: Total I/O In: -  Out: 800 [Urine:800]  General appearance: alert, cooperative and appears stated age Head: Normocephalic, without obvious abnormality, atraumatic Lungs: normal effort Abdomen: soft, non-tender; bowel sounds normal; no masses,  no organomegaly Extremities: edema 1+ Skin: Skin color, texture, turgor normal. No rashes or lesions or incision is clean and dry and healing well. Neurologic: Grossly normal  Results for orders placed or performed during the hospital encounter of 11/25/15 (from the past 24 hour(s))  Urinalysis, Routine w reflex microscopic (not at [facility])     Status: Abnormal   Collection Time: 11/25/15  5:40 PM  Result Value Ref Range   Color, Urine YELLOW YELLOW   APPearance CLEAR CLEAR   Specific Gravity, Urine 1.010 1.005 - 1.030   pH 6.0 5.0 - 8.0   Glucose, UA NEGATIVE NEGATIVE mg/dL   Hgb urine dipstick LARGE (A) NEGATIVE   Bilirubin Urine NEGATIVE NEGATIVE   Ketones, ur NEGATIVE NEGATIVE mg/dL   Protein, ur NEGATIVE NEGATIVE mg/dL   Nitrite NEGATIVE NEGATIVE   Leukocytes, UA NEGATIVE NEGATIVE  Protein / creatinine ratio, urine     Status: None   Collection Time: 11/25/15  5:40 PM  Result Value Ref Range   Creatinine, Urine 69.00 mg/dL   Total Protein, Urine 9 mg/dL   Protein  Creatinine Ratio 0.13 0.00 - 0.15 mg/mg[Cre]  Urine microscopic-add on     Status: Abnormal   Collection Time: 11/25/15  5:40 PM  Result Value Ref Range   Squamous Epithelial / LPF 0-5 (A) NONE SEEN   WBC, UA 0-5 0 - 5 WBC/hpf   RBC / HPF 6-30 0 - 5 RBC/hpf   Bacteria, UA NONE SEEN NONE SEEN  CBC     Status: Abnormal   Collection Time: 11/25/15  6:43 PM  Result Value Ref Range   WBC 7.1 4.0 - 10.5 K/uL   RBC 4.75 3.87 - 5.11 MIL/uL   Hemoglobin 12.1 12.0 - 15.0 g/dL   HCT 37.5 36.0 - 46.0 %   MCV 78.9 78.0 - 100.0 fL   MCH 25.5 (L) 26.0 - 34.0 pg   MCHC 32.3 30.0 - 36.0 g/dL   RDW 15.3 11.5 - 15.5 %   Platelets 401 (H) 150 - 400 K/uL  Comprehensive metabolic panel     Status: Abnormal   Collection Time: 11/25/15  6:43 PM  Result Value Ref Range   Sodium 143 135 - 145 mmol/L   Potassium 3.6 3.5 - 5.1 mmol/L   Chloride 106 101 - 111 mmol/L   CO2 26 22 - 32 mmol/L   Glucose, Bld 107 (H) 65 - 99 mg/dL   BUN 10 6 - 20 mg/dL   Creatinine, Ser 0.53 0.44 - 1.00 mg/dL   Calcium 8.9 8.9 - 10.3 mg/dL   Total Protein 6.9 6.5 - 8.1 g/dL  Albumin 3.4 (L) 3.5 - 5.0 g/dL   AST 22 15 - 41 U/L   ALT 25 14 - 54 U/L   Alkaline Phosphatase 80 38 - 126 U/L   Total Bilirubin 0.2 (L) 0.3 - 1.2 mg/dL   GFR calc non Af Amer >60 >60 mL/min   GFR calc Af Amer >60 >60 mL/min   Anion gap 11 5 - 15  Glucose, capillary     Status: Abnormal   Collection Time: 11/25/15 11:23 PM  Result Value Ref Range   Glucose-Capillary 161 (H) 65 - 99 mg/dL  Glucose, capillary     Status: Abnormal   Collection Time: 11/26/15  6:40 AM  Result Value Ref Range   Glucose-Capillary 146 (H) 65 - 99 mg/dL    Scheduled Meds: . docusate sodium  100 mg Oral Daily  . insulin glargine  10 Units Subcutaneous QHS  . prenatal multivitamin  1 tablet Oral Q1200   Continuous Infusions: . sodium chloride 50 mL/hr at 11/25/15 1856  . magnesium sulfate 2 g/hr (11/25/15 1953)   PRN Meds:acetaminophen, calcium carbonate,  hydrALAZINE, labetalol, zolpidem  Assessment/Plan: Principal Problem:   Preeclampsia in postpartum period  BP is improved, she is negative from a I/O perspective.  Will begin an antihypertensive.   LOS: 1 day   [name], MD 11/26/2015 9:16 AM

## 2015-11-27 LAB — GLUCOSE, CAPILLARY: Glucose-Capillary: 113 mg/dL — ABNORMAL HIGH (ref 65–99)

## 2015-11-27 MED ORDER — AMLODIPINE BESYLATE 5 MG PO TABS: 5.0000 mg | ORAL_TABLET | Freq: Every day | ORAL | Status: DC

## 2015-11-27 NOTE — Discharge Instructions (Signed)
DASH Eating Plan °DASH stands for "Dietary Approaches to Stop Hypertension." The DASH eating plan is a healthy eating plan that has been shown to reduce high blood pressure (hypertension). Additional health benefits may include reducing the risk of type 2 diabetes mellitus, heart disease, and stroke. The DASH eating plan may also help with weight loss. °WHAT DO I NEED TO KNOW ABOUT THE DASH EATING PLAN? °For the DASH eating plan, you will follow these general guidelines: °· Choose foods with a percent daily value for sodium of less than 5% (as listed on the food label). °· Use salt-free seasonings or herbs instead of table salt or sea salt. °· Check with your health care provider or pharmacist before using salt substitutes. °· Eat lower-sodium products, often labeled as "lower sodium" or "no salt added." °· Eat fresh foods. °· Eat more vegetables, fruits, and low-fat dairy products. °· Choose whole grains. Look for the word "whole" as the first word in the ingredient list. °· Choose fish and skinless chicken or turkey more often than red meat. Limit fish, poultry, and meat to 6 oz (170 g) each day. °· Limit sweets, desserts, sugars, and sugary drinks. °· Choose heart-healthy fats. °· Limit cheese to 1 oz (28 g) per day. °· Eat more home-cooked food and less restaurant, buffet, and fast food. °· Limit fried foods. °· Cook foods using methods other than frying. °· Limit canned vegetables. If you do use them, rinse them well to decrease the sodium. °· When eating at a restaurant, ask that your food be prepared with less salt, or no salt if possible. °WHAT FOODS CAN I EAT? °Seek help from a dietitian for individual calorie needs. °Grains °Whole grain or whole wheat bread. Brown rice. Whole grain or whole wheat pasta. Quinoa, bulgur, and whole grain cereals. Low-sodium cereals. Corn or whole wheat flour tortillas. Whole grain cornbread. Whole grain crackers. Low-sodium crackers. °Vegetables °Fresh or frozen vegetables  (raw, steamed, roasted, or grilled). Low-sodium or reduced-sodium tomato and vegetable juices. Low-sodium or reduced-sodium tomato sauce and paste. Low-sodium or reduced-sodium canned vegetables.  °Fruits °All fresh, canned (in natural juice), or frozen fruits. °Meat and Other Protein Products °Ground beef (85% or leaner), grass-fed beef, or beef trimmed of fat. Skinless chicken or turkey. Ground chicken or turkey. Pork trimmed of fat. All fish and seafood. Eggs. Dried beans, peas, or lentils. Unsalted nuts and seeds. Unsalted canned beans. °Dairy °Low-fat dairy products, such as skim or 1% milk, 2% or reduced-fat cheeses, low-fat ricotta or cottage cheese, or plain low-fat yogurt. Low-sodium or reduced-sodium cheeses. °Fats and Oils °Tub margarines without trans fats. Light or reduced-fat mayonnaise and salad dressings (reduced sodium). Avocado. Safflower, olive, or canola oils. Natural peanut or almond butter. °Other °Unsalted popcorn and pretzels. °The items listed above may not be a complete list of recommended foods or beverages. Contact your dietitian for more options. °WHAT FOODS ARE NOT RECOMMENDED? °Grains °White bread. White pasta. White rice. Refined cornbread. Bagels and croissants. Crackers that contain trans fat. °Vegetables °Creamed or fried vegetables. Vegetables in a cheese sauce. Regular canned vegetables. Regular canned tomato sauce and paste. Regular tomato and vegetable juices. °Fruits °Dried fruits. Canned fruit in light or heavy syrup. Fruit juice. °Meat and Other Protein Products °Fatty cuts of meat. Ribs, chicken wings, bacon, sausage, bologna, salami, chitterlings, fatback, hot dogs, bratwurst, and packaged luncheon meats. Salted nuts and seeds. Canned beans with salt. °Dairy °Whole or 2% milk, cream, half-and-half, and cream cheese. Whole-fat or sweetened yogurt. Full-fat   cheeses or blue cheese. Nondairy creamers and whipped toppings. Processed cheese, cheese spreads, or cheese  curds. Condiments Onion and garlic salt, seasoned salt, table salt, and sea salt. Canned and packaged gravies. Worcestershire sauce. Tartar sauce. Barbecue sauce. Teriyaki sauce. Soy sauce, including reduced sodium. Steak sauce. Fish sauce. Oyster sauce. Cocktail sauce. Horseradish. Ketchup and mustard. Meat flavorings and tenderizers. Bouillon cubes. Hot sauce. Tabasco sauce. Marinades. Taco seasonings. Relishes. Fats and Oils Butter, stick margarine, lard, shortening, ghee, and bacon fat. Coconut, palm kernel, or palm oils. Regular salad dressings. Other Pickles and olives. Salted popcorn and pretzels. The items listed above may not be a complete list of foods and beverages to avoid. Contact your dietitian for more information. WHERE CAN I FIND MORE INFORMATION? National Heart, Lung, and [employer] Institute: travelstabloid.com   This information is not intended to replace advice given to you by your health care provider. Make sure you discuss any questions you have with your health care provider.   Document Released: 10/25/2011 Document Revised: 11/26/2014 Document Reviewed: 09/09/2013 Elsevier Interactive Patient Education 2016 [employer] Preeclampsia and Eclampsia Preeclampsia is a serious condition that develops only during pregnancy. It is also called toxemia of pregnancy. This condition causes high blood pressure along with other symptoms, such as swelling and headaches. These may develop as the condition gets worse. Preeclampsia may occur 20 weeks or later into your pregnancy.  Diagnosing and treating preeclampsia early is very important. If not treated early, it can cause serious problems for you and your baby. One problem it can lead to is eclampsia, which is a condition that causes muscle jerking or shaking (convulsions) in the mother. Delivering your baby is the best treatment for preeclampsia or eclampsia.  RISK FACTORS The cause of preeclampsia is not  known. You may be more likely to develop preeclampsia if you have certain risk factors. These include:   Being pregnant for the first time.  Having preeclampsia in a past pregnancy.  Having a family history of preeclampsia.  Having high blood pressure.  Being pregnant with twins or triplets.  Being 16 or older.  Being African American.  Having kidney disease or diabetes.  Having medical conditions such as lupus or blood diseases.  Being very overweight (obese). SIGNS AND SYMPTOMS  The earliest signs of preeclampsia are:  High blood pressure.  Increased protein in your urine. Your health care provider will check for this at every prenatal visit. Other symptoms that can develop include:   Severe headaches.  Sudden weight gain.  Swelling of your hands, face, legs, and feet.  Feeling sick to your stomach (nauseous) and throwing up (vomiting).  Vision problems (blurred or double vision).  Numbness in your face, arms, legs, and feet.  Dizziness.  Slurred speech.  Sensitivity to bright lights.  Abdominal pain. DIAGNOSIS  There are no screening tests for preeclampsia. Your health care provider will ask you about symptoms and check for signs of preeclampsia during your prenatal visits. You may also have tests, including:  Urine testing.  Blood testing.  Checking your baby's heart rate.  Checking the health of your baby and your placenta using images created with sound waves (ultrasound). TREATMENT  You can work out the best treatment approach together with your health care provider. It is very important to keep all prenatal appointments. If you have an increased risk of preeclampsia, you may need more frequent prenatal exams.  Your health care provider may prescribe bed rest.  You may have to eat as little  salt as possible.  You may need to take medicine to lower your blood pressure if the condition does not respond to more conservative measures.  You may  need to stay in the hospital if your condition is severe. There, treatment will focus on controlling your blood pressure and fluid retention. You may also need to take medicine to prevent seizures.  If the condition gets worse, your baby may need to be delivered early to protect you and the baby. You may have your labor started with medicine (be induced), or you may have a cesarean delivery.  Preeclampsia usually goes away after the baby is born. HOME CARE INSTRUCTIONS   Only take over-the-counter or prescription medicines as directed by your health care provider.  Lie on your left side while resting. This keeps pressure off your baby.  Elevate your feet while resting.  Get regular exercise. Ask your health care provider what type of exercise is safe for you.  Avoid caffeine and alcohol.  Do not smoke.  Drink 6-8 glasses of water every day.  Eat a balanced diet that is low in salt. Do not add salt to your food.  Avoid stressful situations as much as possible.  Get plenty of rest and sleep.  Keep all prenatal appointments and tests as scheduled. SEEK MEDICAL CARE IF:  You are gaining more weight than expected.  You have any headaches, abdominal pain, or nausea.  You are bruising more than usual.  You feel dizzy or light-headed. SEEK IMMEDIATE MEDICAL CARE IF:   You develop sudden or severe swelling anywhere in your body. This usually happens in the legs.  You gain 5 lb (2.3 kg) or more in a week.  You have a severe headache, dizziness, problems with your vision, or confusion.  You have severe abdominal pain.  You have lasting nausea or vomiting.  You have a seizure.  You have trouble moving any part of your body.  You develop numbness in your body.  You have trouble speaking.  You have any abnormal bleeding.  You develop a stiff neck.  You pass out. MAKE SURE YOU:   Understand these instructions.  Will watch your condition.  Will get help right away  if you are not doing well or get worse.   This information is not intended to replace advice given to you by your health care provider. Make sure you discuss any questions you have with your health care provider.   Document Released: 11/02/2000 Document Revised: 11/10/2013 Document Reviewed: 08/28/2013 Elsevier Interactive Patient Education [employer].

## 2015-11-27 NOTE — Discharge Summary (Signed)
Physician Discharge Summary  Patient ID: Carmen Watts MRN: [id number] DOB/AGE: 05-19-1981 35 y.o.  Admit date: 11/25/2015 Discharge date: 11/27/2015   Discharge Diagnoses:  Principal Problem:   Preeclampsia in postpartum period   Consults: None  Significant Diagnostic Studies: labs:  CBC    Component Value Date/Time   WBC 7.1 11/25/2015 1843   WBC 10.0 06/10/2012 1033   RBC 4.75 11/25/2015 1843   RBC 4.74 06/10/2012 1033   HGB 12.1 11/25/2015 1843   HGB 12.7 06/10/2012 1033   HCT 37.5 11/25/2015 1843   HCT 41.9 06/10/2012 1033   PLT 401* 11/25/2015 1843   MCV 78.9 11/25/2015 1843   MCV 88.3 06/10/2012 1033   MCH 25.5* 11/25/2015 1843   MCH 26.7* 06/10/2012 1033   MCHC 32.3 11/25/2015 1843   MCHC 30.3* 06/10/2012 1033   RDW 15.3 11/25/2015 1843   LYMPHSABS 3.4 04/06/2015 1453   MONOABS 0.6 04/06/2015 1453   EOSABS 0.2 04/06/2015 1453   BASOSABS 0.0 04/06/2015 1453    CMP Latest Ref Rng 11/25/2015 07/05/2015 05/09/2015  Glucose 65 - 99 mg/dL 107(H) 130(H) 178(H)  BUN 6 - 20 mg/dL _0 Creatinine 0.44 - 1.00 mg/dL 0.53 0.48 0.58  Sodium 135 - 145 mmol/L 143 134(L) 135  Potassium 3.5 - 5.1 mmol/L 3.6 3.7 3.8  Chloride 101 - 111 mmol/L 106 103 101  CO2 22 - 32 mmol/L _1 Calcium 8.9 - 10.3 mg/dL 8.9 9.1 9.2  Total Protein 6.5 - 8.1 g/dL 6.9 7.3 6.2  Total Bilirubin 0.3 - 1.2 mg/dL 0.2(L) 0.6 0.4  Alkaline Phos 38 - 126 U/L 80 45 50  AST 15 - 41 U/L 22 12(L) 6  ALT 14 - 54 U/L 25 12(L) <8     Hospital Course: Admitted with significantly elevated BP's. Begun on Magnesium Sulfate x 24 hours. I/O showed negative 2-3 L. Weight loss was 8 lbs in 24 hours. Edema significantly improved.  BP's down to the 120-150/80-90 range.  Begun on Norvasc 5 mg daily with BP improvement. Stable for discharge.  Discharge exam: Filed Vitals:   11/26/15 2343 11/27/15 0557  BP: 121/69 140/81  Pulse: 95 82  Temp: 98.2 F (36.8 C) 98.2 F (36.8 C)  Resp: 18 20    Physical  Examination: General appearance - alert, well appearing, and in no distress Neck - supple, no significant adenopathy Chest - normal effort Abdomen - soft, nontender, nondistended, no masses or organomegaly Extremities - peripheral pulses normal, no pedal edema, no clubbing or cyanosis   Disposition: 01-Home or Self Care  Discharged Condition: good  Discharge Instructions    Activity as tolerated    Complete by:  As directed      Call MD for:  difficulty breathing, headache or visual disturbances    Complete by:  As directed      Call MD for:  redness, tenderness, or signs of infection (pain, swelling, redness, odor or green/yellow discharge around incision site)    Complete by:  As directed      Diet - low sodium heart healthy    Complete by:  As directed   Low carb     Discharge wound care:    Complete by:  As directed   Keep incision clean and dry     Sexual acrtivity    Complete by:  As directed   None x 4 wks            Medication List  STOP taking these medications        aspirin EC 81 MG tablet     ibuprofen 600 MG tablet  Commonly known as:  ADVIL,MOTRIN     insulin aspart 100 UNIT/ML FlexPen  Commonly known as:  NOVOLOG FLEXPEN     metformin 1000 MG (OSM) 24 hr tablet  Commonly known as:  FORTAMET      TAKE these medications        ACCU-CHEK FASTCLIX LANCETS Misc  Inject 1 each into the skin 4 (four) times daily. 648.83 for testing 4 times daily     ACCU-CHEK NANO SMARTVIEW w/Device Kit  1 Units by Does not apply route 3 (three) times daily.     albuterol 108 (90 Base) MCG/ACT inhaler  Commonly known as:  PROVENTIL HFA;VENTOLIN HFA  Inhale 2 puffs into the lungs every 6 (six) hours as needed for shortness of breath. For asthma syptoms     amLODipine 5 MG tablet  Commonly known as:  NORVASC  Take 1 tablet (5 mg total) by mouth daily.     ferrous sulfate 325 (65 FE) MG tablet  Take 325 mg by mouth daily with breakfast.     glucose blood test  strip  Use as instructed     insulin glargine 100 UNIT/ML injection  Commonly known as:  LANTUS  Inject 0.1 mLs (10 Units total) into the skin daily.     nystatin cream  Commonly known as:  MYCOSTATIN  Apply 1 application topically daily as needed for dry skin.           Follow-up Information    Follow up with [facility] In 2 weeks.   Specialty:  Obstetrics and Gynecology   Why:  they will call you with an appointment for blood pressure check   Contact information:   [facility] [phone]      Signed: [name] 11/27/2015, 7:26 AM

## 2015-11-28 NOTE — Telephone Encounter (Signed)
Patient was in [facility] on 11/25/15 and admitted.

## 2015-12-12 ENCOUNTER — Ambulatory Visit: Payer: Medicaid Other

## 2015-12-12 VITALS — BP 128/72 | HR 71 | Temp 98.4°F | Wt 242.5 lb

## 2015-12-12 DIAGNOSIS — O1495 Unspecified pre-eclampsia, complicating the puerperium: Secondary | ICD-10-CM

## 2015-12-28 ENCOUNTER — Encounter: Payer: Self-pay | Admitting: Student

## 2015-12-28 ENCOUNTER — Ambulatory Visit (INDEPENDENT_AMBULATORY_CARE_PROVIDER_SITE_OTHER): Payer: Medicaid Other | Admitting: Student

## 2015-12-28 DIAGNOSIS — Z794 Long term (current) use of insulin: Secondary | ICD-10-CM

## 2015-12-28 DIAGNOSIS — E119 Type 2 diabetes mellitus without complications: Secondary | ICD-10-CM

## 2015-12-28 LAB — POCT PREGNANCY, URINE: PREG TEST UR: NEGATIVE

## 2015-12-28 MED ORDER — INSULIN ASPART 100 UNIT/ML ~~LOC~~ SOLN: 10.0000 [IU] | Freq: Three times a day (TID) | SUBCUTANEOUS | Status: DC

## 2015-12-28 MED ORDER — "INSULIN SYRINGE-NEEDLE U-100 30G X 5/16"" 0.5 ML MISC": 1.0000 | Freq: Three times a day (TID) | Status: DC

## 2015-12-28 NOTE — Progress Notes (Signed)
Patient ID: Carmen Watts, female   DOB: February 16, 1981, 35 y.o.   MRN: [id number] Subjective:     Carmen Watts is a 35 y.o. female who presents for a postpartum visit. She is 6 weeks postpartum following a low cervical transverse Cesarean section. I have fully reviewed the prenatal and intrapartum course. The delivery was at 78 gestational weeks. Outcome: repeat cesarean section, low transverse incision. Anesthesia: spinal. Postpartum course has been complicated by readmission for pre eclampsia. Baby's course has been unremarkable. Baby is feeding by bottle - Similac Isomil. Bleeding staining only. Bowel function is normal. Bladder function is normal. Patient is not sexually active. Contraception method is tubal ligation. Postpartum depression screening: negative.  The following portions of the patient's history were reviewed and updated as appropriate: allergies, current medications, past family history, past medical history, past social history, past surgical history and problem list.  Review of Systems A comprehensive review of systems was negative.   Objective:    BP 128/76 mmHg  Pulse 92  Temp(Src) 98.7 F (37.1 C) (Oral)  Wt 246 lb 4.8 oz (111.721 kg)  Breastfeeding? No  General:  alert, cooperative, appears stated age, no distress and moderately obese  Lungs: clear to auscultation bilaterally  Heart:  regular rate and rhythm, S1, S2 normal, no murmur, click, rub or gallop  Abdomen: soft, non-tender; bowel sounds normal; no masses,  no organomegaly Incision scar well healed        Assessment:     Normal postpartum exam. Pap smear not done at today's visit. Due next year.  Pt diabetic on insulin. States BS have been elevated after meals but forget to bring log with her. Was taking novolog with meals prior to pregnancy & requesting rx at this time since she hasn't been able to get in with her PCP.   Plan:  1. Postpartum care and examination   2. Type 2 diabetes mellitus treated with  insulin ([facility]) -Rx novolog -F/u with PCP to manage diabetes as per pre pregnancy   F/u with [place] in 1 year for routine gyn care & pap smear  [name], NP

## 2015-12-28 NOTE — Patient Instructions (Signed)
Health Maintenance, Female Adopting a healthy lifestyle and getting preventive care can go a long way to promote health and wellness. Talk with your health care provider about what schedule of regular examinations is right for you. This is a good chance for you to check in with your provider about disease prevention and staying healthy. In between checkups, there are plenty of things you can do on your own. Experts have done a lot of research about which lifestyle changes and preventive measures are most likely to keep you healthy. Ask your health care provider for more information. WEIGHT AND DIET  Eat a healthy diet  Be sure to include plenty of vegetables, fruits, low-fat dairy products, and lean protein.  Do not eat a lot of foods high in solid fats, added sugars, or salt.  Get regular exercise. This is one of the most important things you can do for your health.  Most adults should exercise for at least 150 minutes each week. The exercise should increase your heart rate and make you sweat (moderate-intensity exercise).  Most adults should also do strengthening exercises at least twice a week. This is in addition to the moderate-intensity exercise.  Maintain a healthy weight  Body mass index (BMI) is a measurement that can be used to identify possible weight problems. It estimates body fat based on height and weight. Your health care provider can help determine your BMI and help you achieve or maintain a healthy weight.  For females 20 years of age and older:   A BMI below 18.5 is considered underweight.  A BMI of 18.5 to 24.9 is normal.  A BMI of 25 to 29.9 is considered overweight.  A BMI of 30 and above is considered obese.  Watch levels of cholesterol and blood lipids  You should start having your blood tested for lipids and cholesterol at 33 years of age, then have this test every 5 years.  You may need to have your cholesterol levels checked more often if:  Your lipid  or cholesterol levels are high.  You are older than 33 years of age.  You are at high risk for heart disease.  CANCER SCREENING   Lung Cancer  Lung cancer screening is recommended for adults 55-80 years old who are at high risk for lung cancer because of a history of smoking.  A yearly low-dose CT scan of the lungs is recommended for people who:  Currently smoke.  Have quit within the past 15 years.  Have at least a 30-pack-year history of smoking. A pack year is smoking an average of one pack of cigarettes a day for 1 year.  Yearly screening should continue until it has been 15 years since you quit.  Yearly screening should stop if you develop a health problem that would prevent you from having lung cancer treatment.  Breast Cancer  Practice breast self-awareness. This means understanding how your breasts normally appear and feel.  It also means doing regular breast self-exams. Let your health care provider know about any changes, no matter how small.  If you are in your 20s or 30s, you should have a clinical breast exam (CBE) by a health care provider every 1-3 years as part of a regular health exam.  If you are 40 or older, have a CBE every year. Also consider having a breast X-ray (mammogram) every year.  If you have a family history of breast cancer, talk to your health care provider about genetic screening.  If you   are at high risk for breast cancer, talk to your health care provider about having an MRI and a mammogram every year.  Breast cancer gene (BRCA) assessment is recommended for women who have family members with BRCA-related cancers. BRCA-related cancers include:  Breast.  Ovarian.  Tubal.  Peritoneal cancers.  Results of the assessment will determine the need for genetic counseling and BRCA1 and BRCA2 testing. Cervical Cancer Your health care provider may recommend that you be screened regularly for cancer of the pelvic organs (ovaries, uterus, and  vagina). This screening involves a pelvic examination, including checking for microscopic changes to the surface of your cervix (Pap test). You may be encouraged to have this screening done every 3 years, beginning at age 21.  For women ages 30-65, health care providers may recommend pelvic exams and Pap testing every 3 years, or they may recommend the Pap and pelvic exam, combined with testing for human papilloma virus (HPV), every 5 years. Some types of HPV increase your risk of cervical cancer. Testing for HPV may also be done on women of any age with unclear Pap test results.  Other health care providers may not recommend any screening for nonpregnant women who are considered low risk for pelvic cancer and who do not have symptoms. Ask your health care provider if a screening pelvic exam is right for you.  If you have had past treatment for cervical cancer or a condition that could lead to cancer, you need Pap tests and screening for cancer for at least 20 years after your treatment. If Pap tests have been discontinued, your risk factors (such as having a new sexual partner) need to be reassessed to determine if screening should resume. Some women have medical problems that increase the chance of getting cervical cancer. In these cases, your health care provider may recommend more frequent screening and Pap tests. Colorectal Cancer  This type of cancer can be detected and often prevented.  Routine colorectal cancer screening usually begins at 33 years of age and continues through 33 years of age.  Your health care provider may recommend screening at an earlier age if you have risk factors for colon cancer.  Your health care provider may also recommend using home test kits to check for hidden blood in the stool.  A small camera at the end of a tube can be used to examine your colon directly (sigmoidoscopy or colonoscopy). This is done to check for the earliest forms of colorectal  cancer.  Routine screening usually begins at age 50.  Direct examination of the colon should be repeated every 5-10 years through 33 years of age. However, you may need to be screened more often if early forms of precancerous polyps or small growths are found. Skin Cancer  Check your skin from head to toe regularly.  Tell your health care provider about any new moles or changes in moles, especially if there is a change in a mole's shape or color.  Also tell your health care provider if you have a mole that is larger than the size of a pencil eraser.  Always use sunscreen. Apply sunscreen liberally and repeatedly throughout the day.  Protect yourself by wearing long sleeves, pants, a wide-brimmed hat, and sunglasses whenever you are outside. HEART DISEASE, DIABETES, AND HIGH BLOOD PRESSURE   High blood pressure causes heart disease and increases the risk of stroke. High blood pressure is more likely to develop in:  People who have blood pressure in the high end   of the normal range (130-139/85-89 mm Hg).  People who are overweight or obese.  People who are African American.  If you are 38-23 years of age, have your blood pressure checked every 3-5 years. If you are 61 years of age or older, have your blood pressure checked every year. You should have your blood pressure measured twice--once when you are at a hospital or clinic, and once when you are not at a hospital or clinic. Record the average of the two measurements. To check your blood pressure when you are not at a hospital or clinic, you can use:  An automated blood pressure machine at a pharmacy.  A home blood pressure monitor.  If you are between 45 years and 39 years old, ask your health care provider if you should take aspirin to prevent strokes.  Have regular diabetes screenings. This involves taking a blood sample to check your fasting blood sugar level.  If you are at a normal weight and have a low risk for diabetes,  have this test once every three years after 33 years of age.  If you are overweight and have a high risk for diabetes, consider being tested at a younger age or more often. PREVENTING INFECTION  Hepatitis B  If you have a higher risk for hepatitis B, you should be screened for this virus. You are considered at high risk for hepatitis B if:  You were born in a country where hepatitis B is common. Ask your health care provider which countries are considered high risk.  Your parents were born in a high-risk country, and you have not been immunized against hepatitis B (hepatitis B vaccine).  You have HIV or AIDS.  You use needles to inject street drugs.  You live with someone who has hepatitis B.  You have had sex with someone who has hepatitis B.  You get hemodialysis treatment.  You take certain medicines for conditions, including cancer, organ transplantation, and autoimmune conditions. Hepatitis C  Blood testing is recommended for:  Everyone born from [birth year] through 1963.  Anyone with known risk factors for hepatitis C. Sexually transmitted infections (STIs)  You should be screened for sexually transmitted infections (STIs) including gonorrhea and chlamydia if:  You are sexually active and are younger than 33 years of age.  You are older than 33 years of age and your health care provider tells you that you are at risk for this type of infection.  Your sexual activity has changed since you were last screened and you are at an increased risk for chlamydia or gonorrhea. Ask your health care provider if you are at risk.  If you do not have HIV, but are at risk, it may be recommended that you take a prescription medicine daily to prevent HIV infection. This is called pre-exposure prophylaxis (PrEP). You are considered at risk if:  You are sexually active and do not regularly use condoms or know the HIV status of your partner(s).  You take drugs by injection.  You are sexually  active with a partner who has HIV. Talk with your health care provider about whether you are at high risk of being infected with HIV. If you choose to begin PrEP, you should first be tested for HIV. You should then be tested every 3 months for as long as you are taking PrEP.  PREGNANCY   If you are premenopausal and you may become pregnant, ask your health care provider about preconception counseling.  If you may  become pregnant, take 400 to 800 micrograms (mcg) of folic acid every day.  If you want to prevent pregnancy, talk to your health care provider about birth control (contraception). OSTEOPOROSIS AND MENOPAUSE   Osteoporosis is a disease in which the bones lose minerals and strength with aging. This can result in serious bone fractures. Your risk for osteoporosis can be identified using a bone density scan.  If you are 61 years of age or older, or if you are at risk for osteoporosis and fractures, ask your health care provider if you should be screened.  Ask your health care provider whether you should take a calcium or vitamin D supplement to lower your risk for osteoporosis.  Menopause may have certain physical symptoms and risks.  Hormone replacement therapy may reduce some of these symptoms and risks. Talk to your health care provider about whether hormone replacement therapy is right for you.  HOME CARE INSTRUCTIONS   Schedule regular health, dental, and eye exams.  Stay current with your immunizations.   Do not use any tobacco products including cigarettes, chewing tobacco, or electronic cigarettes.  If you are pregnant, do not drink alcohol.  If you are breastfeeding, limit how much and how often you drink alcohol.  Limit alcohol intake to no more than 1 drink per day for nonpregnant women. One drink equals 12 ounces of beer, 5 ounces of wine, or 1 ounces of hard liquor.  Do not use street drugs.  Do not share needles.  Ask your health care provider for help if  you need support or information about quitting drugs.  Tell your health care provider if you often feel depressed.  Tell your health care provider if you have ever been abused or do not feel safe at home.   This information is not intended to replace advice given to you by your health care provider. Make sure you discuss any questions you have with your health care provider.   Document Released: 05/21/2011 Document Revised: 11/26/2014 Document Reviewed: 10/07/2013 Elsevier Interactive Patient Education [employer].

## 2016-02-23 IMAGING — US US OB TRANSVAGINAL
1 series · 14 of 28 positions shown · non-contrast
Comparison: None.

CLINICAL DATA: Pregnant, left lower quadrant pain x1 week

EXAM:
OBSTETRIC <14 WK US AND TRANSVAGINAL OB US
TECHNIQUE: Both transabdominal and transvaginal ultrasound examinations were
performed for complete evaluation of the gestation as well as the
maternal uterus, adnexal regions, and pelvic cul-de-sac.
Transvaginal technique was performed to assess early pregnancy.

[Series 1: us ob comp less 14 wk · 14 of 43 slices shown]
[im 2/43]
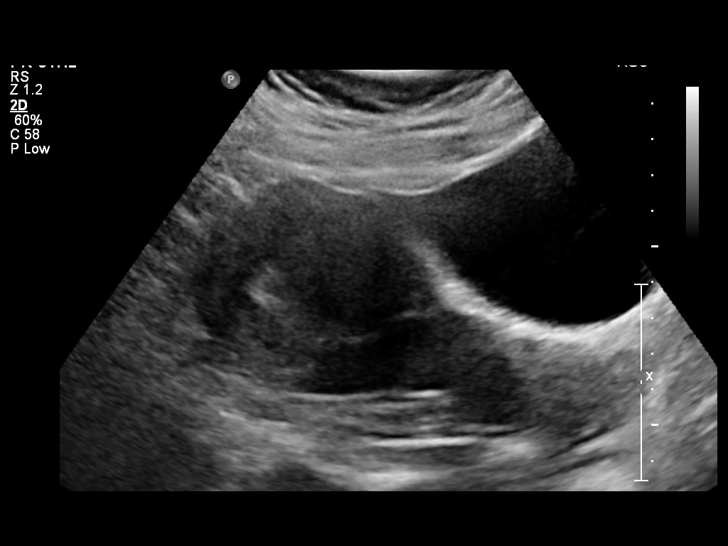
[im 5/43]
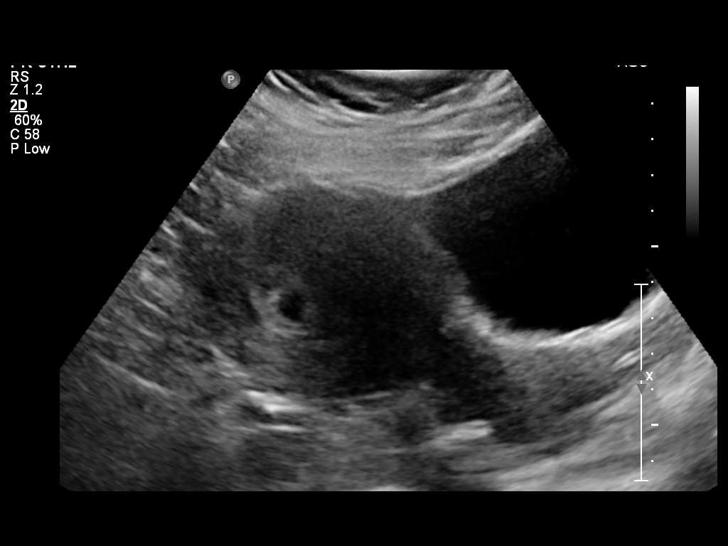
[im 8/43]
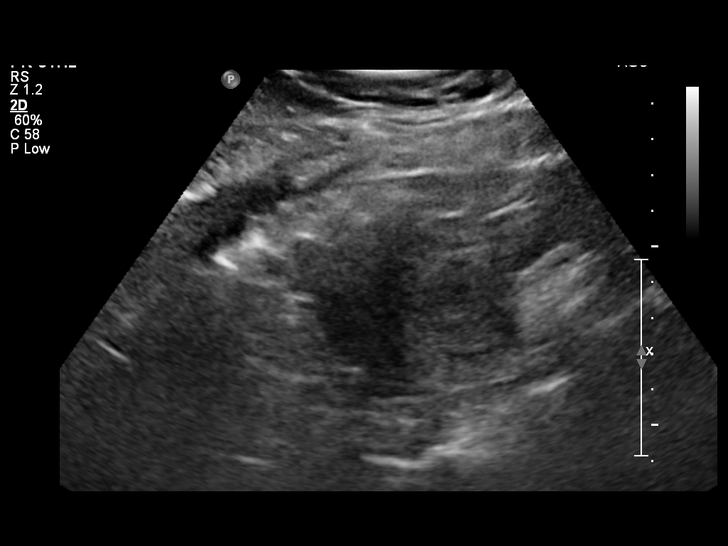
[im 11/43]
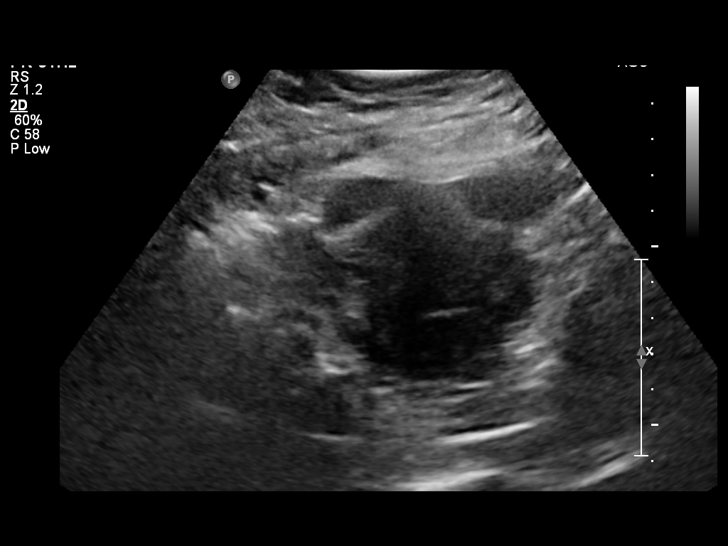
[im 15/43]
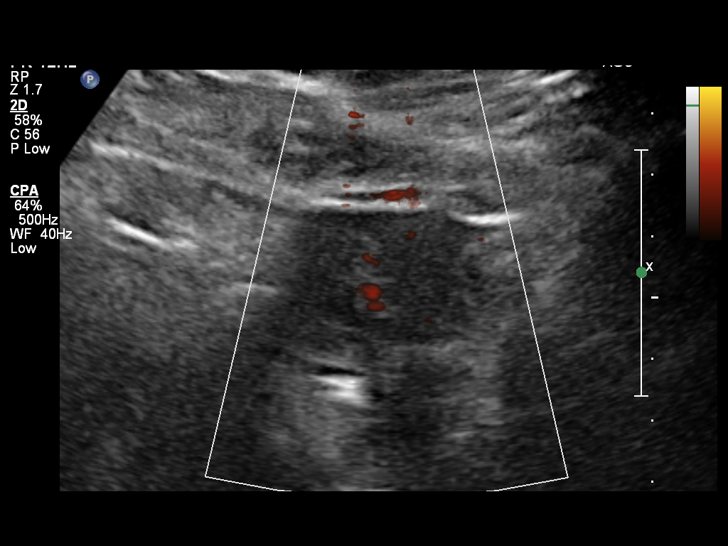
[im 18/43]
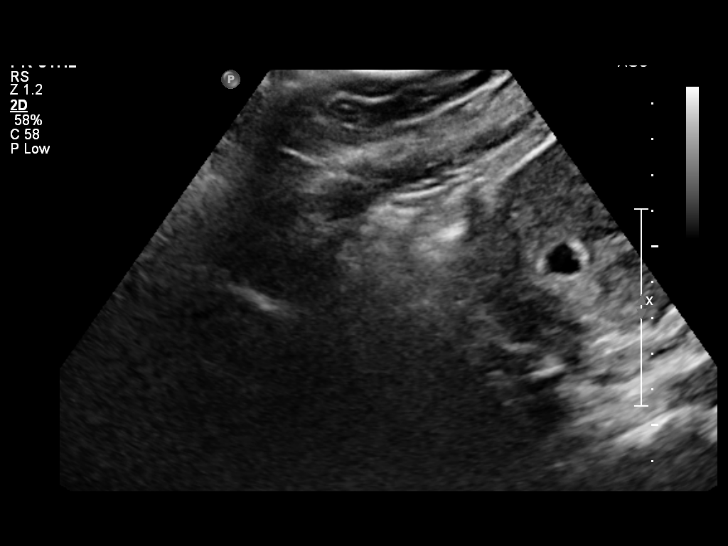
[im 21/43]
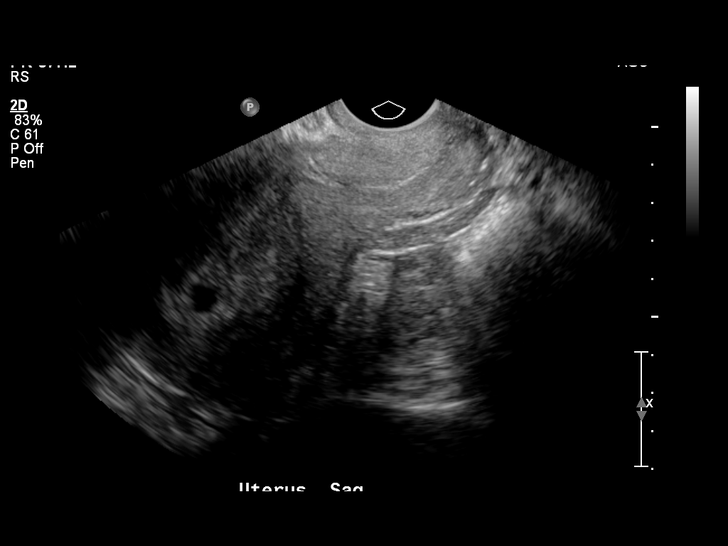
[im 24/43]
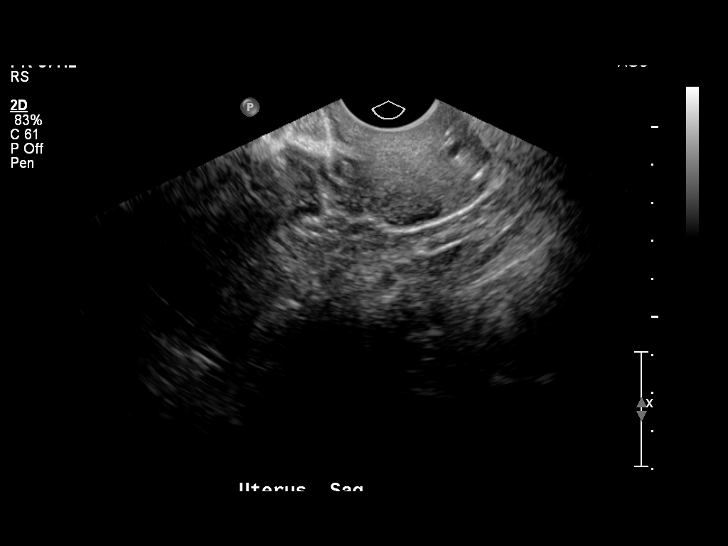
[im 27/43]
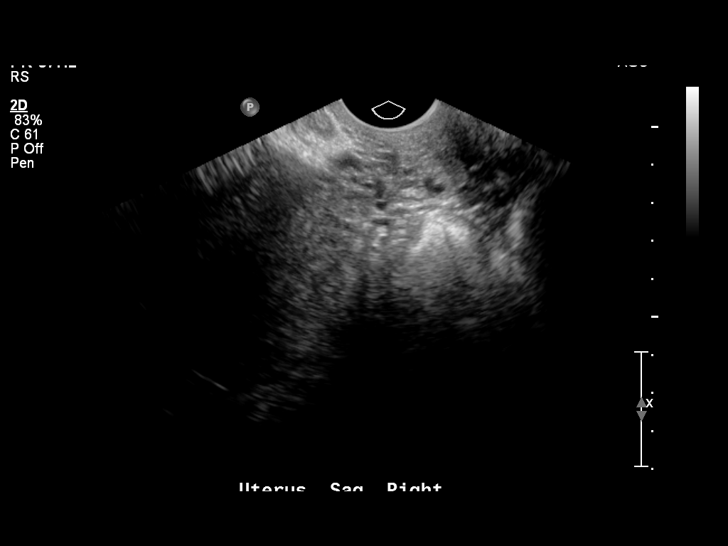
[im 30/43]
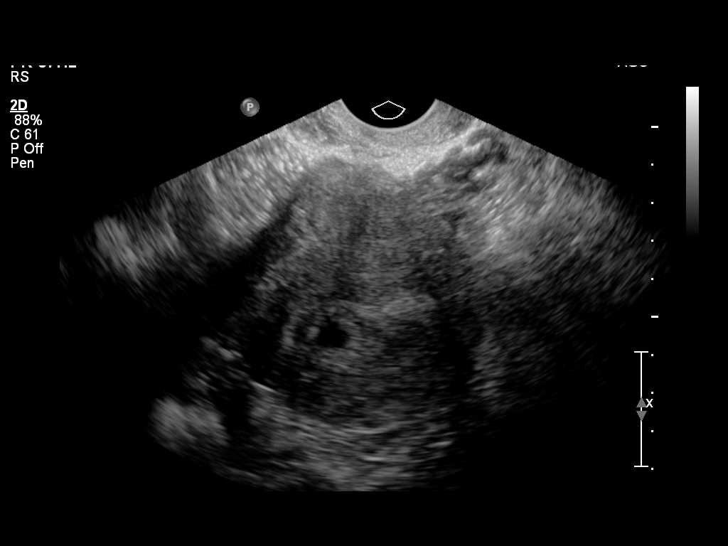
[im 33/43]
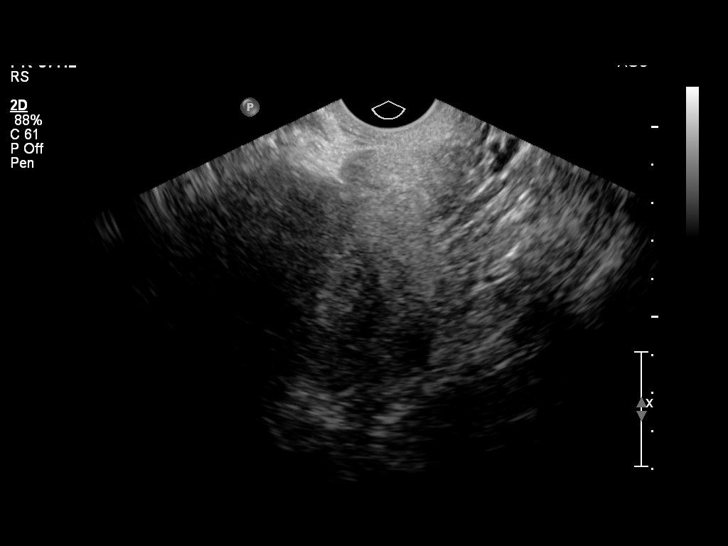
[im 36/43]
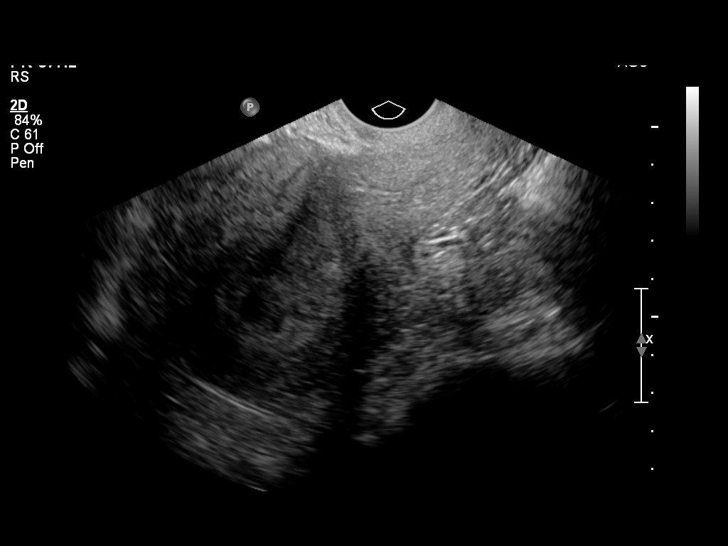
[im 39/43]
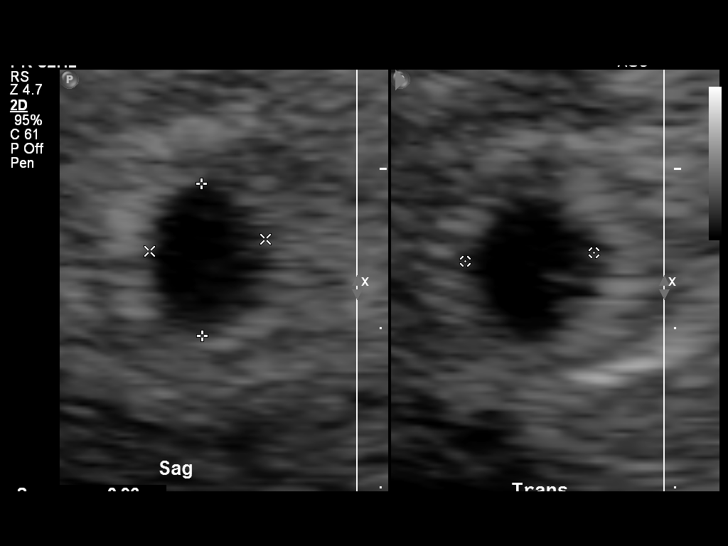
[im 43/43]
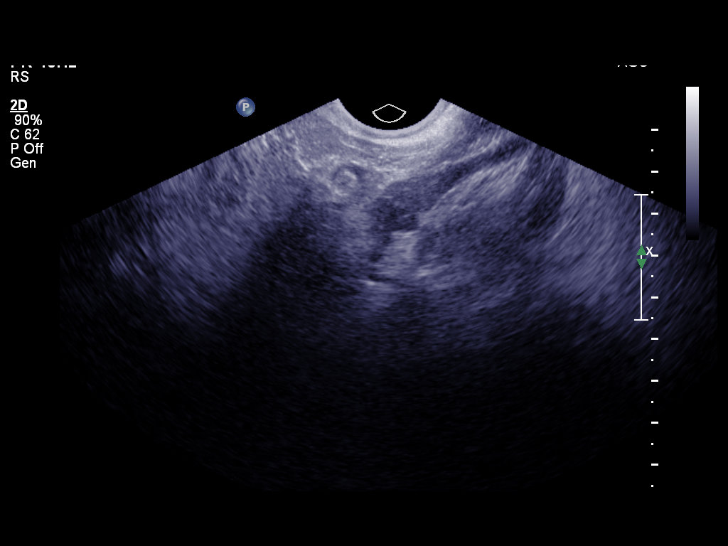

[14 of 28 positions shown; findings below may reference images not displayed]

FINDINGS: Intrauterine gestational sac: Visualized/normal in shape.

Yolk sac:  Present

Embryo:  Not visualized

MSD: 8.3  mm   5 w   3  d

Maternal uterus/adnexae: No subchronic hemorrhage.

Bilateral ovaries are within normal limits.

No free fluid.
IMPRESSION: Single intrauterine gestational sac with yolk sac, measuring 5 weeks
3 days by mean sac diameter.

No fetal pole is visualized.

Consider follow-up pelvic ultrasound in 14 days to confirm
viability.

## 2016-04-30 ENCOUNTER — Other Ambulatory Visit: Payer: Self-pay | Admitting: Family Medicine

## 2016-07-03 IMAGING — US US MFM OB FOLLOW-UP
1 series · 14 of 28 positions shown · non-contrast
Comparison: none

[Series 1: us mfm ob follow-up · 14 of 35 slices shown]
[im 2/35]
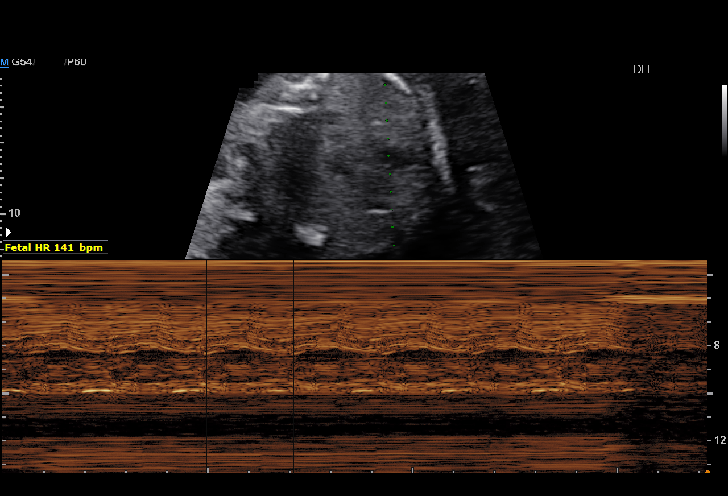
[im 4/35]
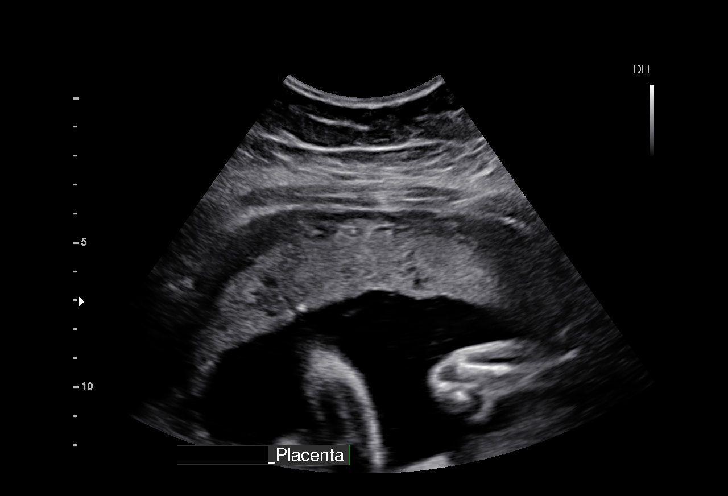
[im 7/35]
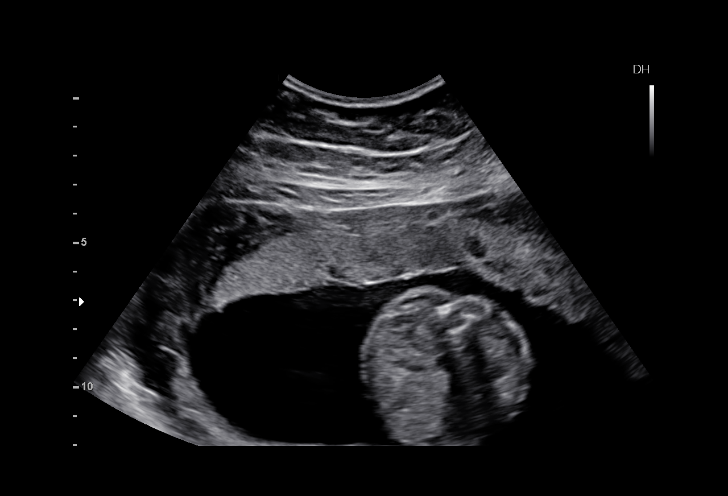
[im 9/35]
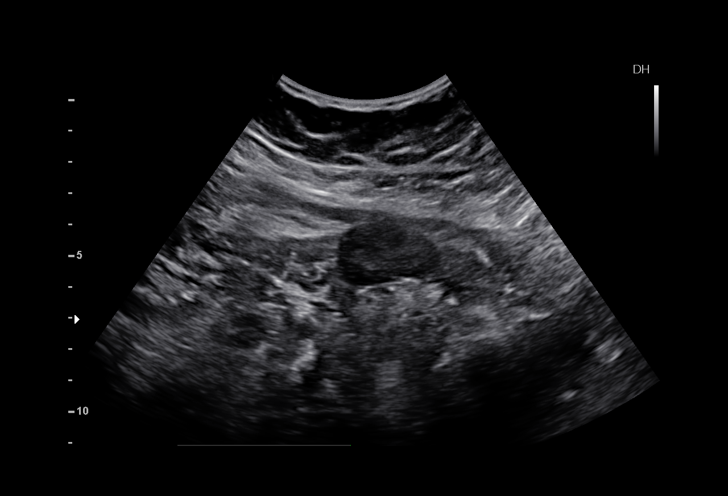
[im 12/35]
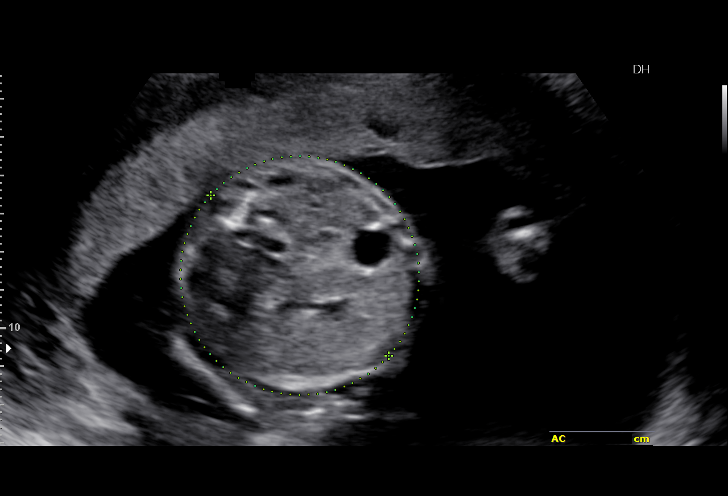
[im 14/35]
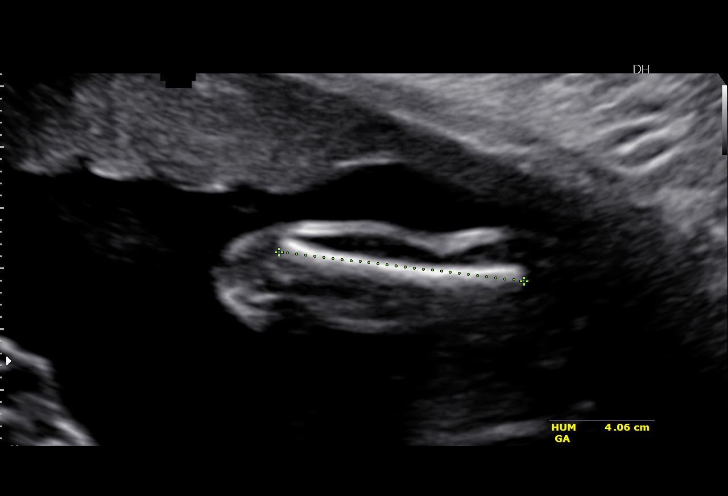
[im 17/35]
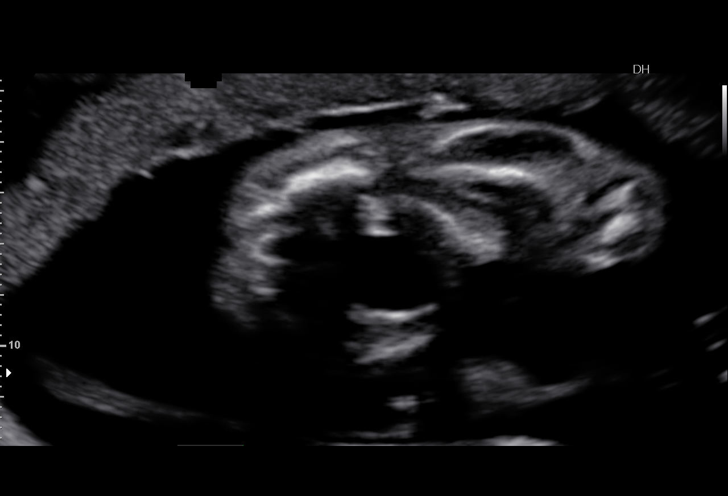
[im 19/35]
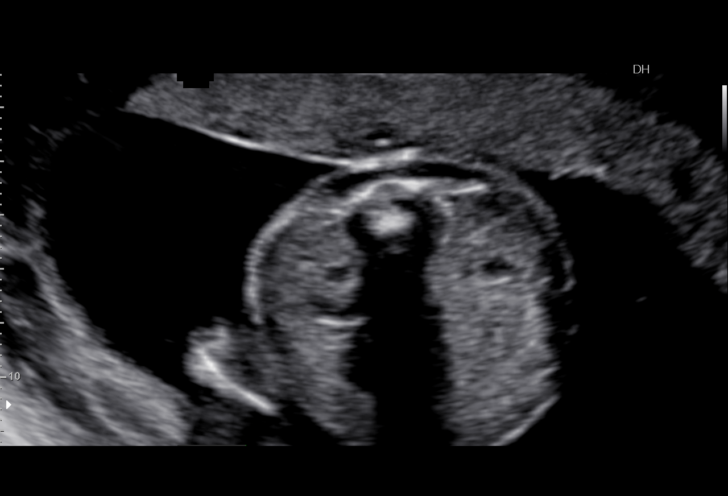
[im 22/35]
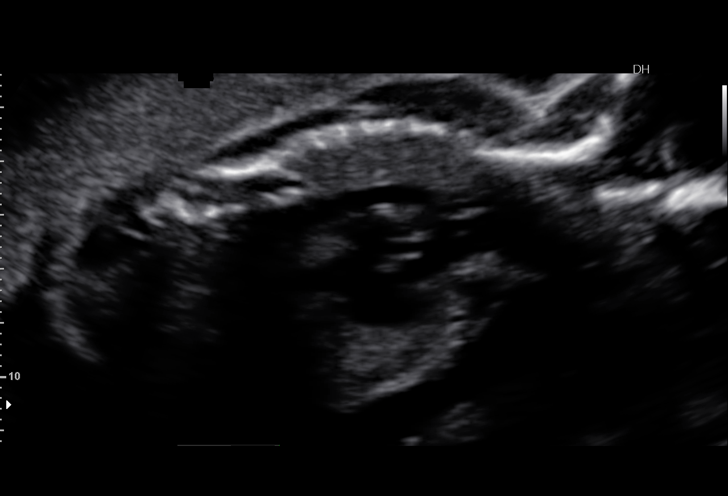
[im 24/35]
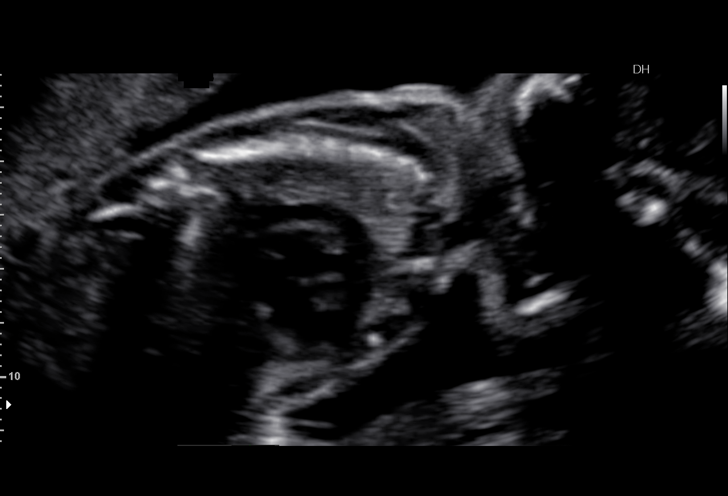
[im 27/35]
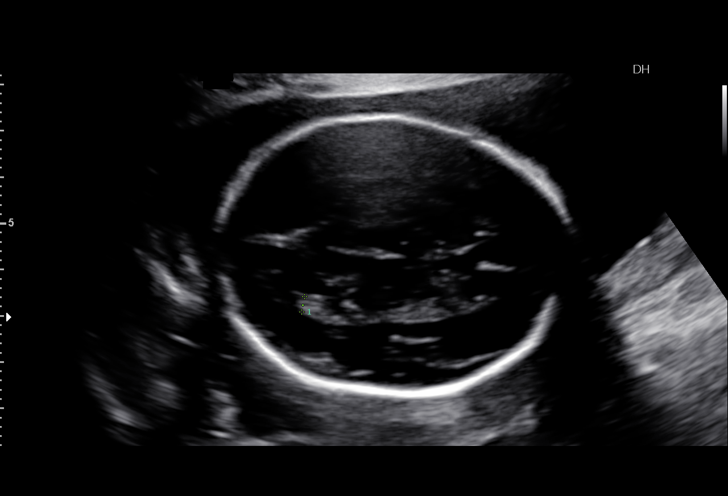
[im 29/35]
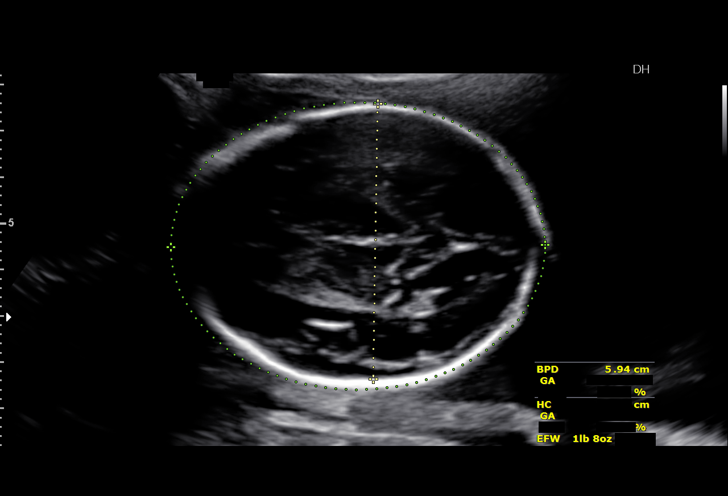
[im 32/35]
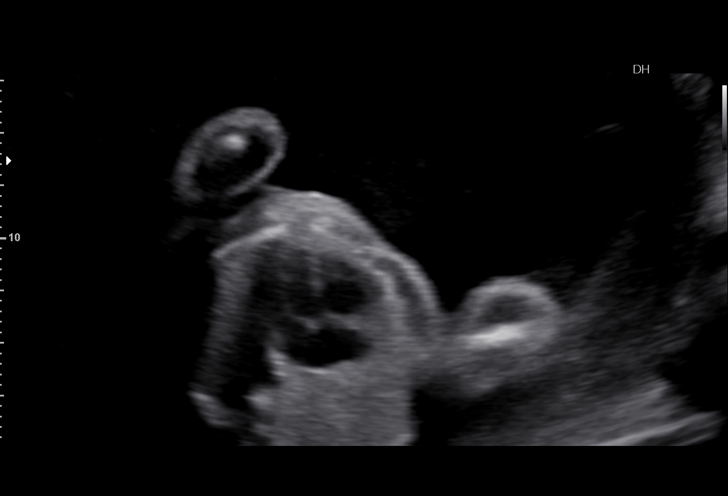
[im 35/35]
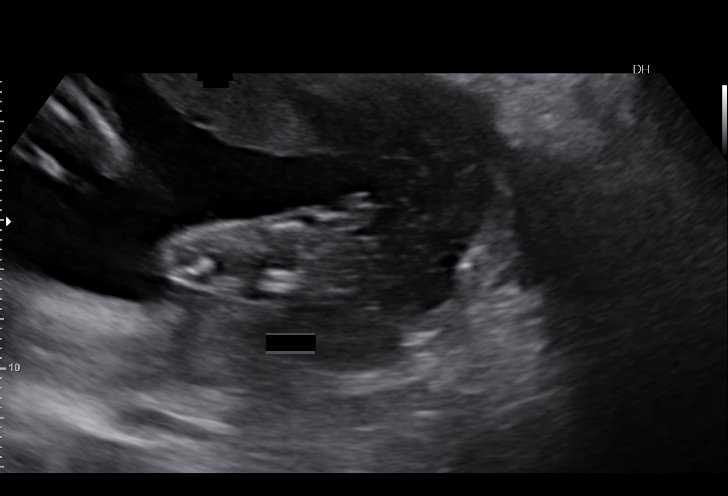

[14 of 28 positions shown; findings below may reference images not displayed]

OBSTETRICS REPORT
(Signed Final 08/15/2015 [DATE])

Date:

Service(s) Provided

Indications

24 weeks gestation of pregnancy
History of cesarean delivery x3, currently pregnant
Obesity complicating pregnancy, second
trimester
Diabetes - Pregestational, 2nd trimester (on
insulin)
Fetal Evaluation

Num Of             1
Fetuses:
Fetal Heart        141                          bpm
Rate:
Cardiac Activity:  Observed
Presentation:      Cephalic
Placenta:          Anterior, above cervical
os
P. Cord            Previously Visualized
Insertion:

Amniotic Fluid
AFI FV:      Subjectively within normal limits
Larg Pckt:      7.4  cm
Biometry

BPD:     59.7   m    G. Age:   24w 3d                 CI:        71.73   70 - 86
m
FL/HC:      19.2   18.7 -
20.3
HC:     224.4   m    G. Age:   24w 3d        22  %    HC/AC:      1.17   1.04 -
m
AC:     191.6   m    G. Age:   23w 6d        19  %    FL/BPD      72.2   71 - 87
m                                     :
FL:      43.1   m    G. Age:   24w 1d        20  %    FL/AC:      22.5   20 - 24
m
HUM:     40.2   m    G. Age:   24w 3d        36  %
m

Est.         655   gm    1 lb 7 oz      37   %
FW:
Gestational Age

U/S Today:     24w 2d                                         EDD:   12/03/15
Anatomy

Cranium:          Appears normal         Aortic Arch:       Appears normal
Fetal Cavum:      Appears normal         Ductal Arch:       Appears normal
Ventricles:       Appears normal         Diaphragm:         Previously seen
Choroid Plexus:   Previously seen        Stomach:           Appears normal,
left sided
Cerebellum:       Previously seen        Abdomen:           Appears normal
Posterior         Previously seen        Abdominal          Previously seen
Fossa:                                   Wall:
Nuchal Fold:      Previously seen        Cord Vessels:      Previously seen
Face:             Orbits and profile     Kidneys:           Appear normal
previously seen
Lips:             Previously seen        Bladder:           Appears normal
Heart:            Appears normal         Spine:             Previously seen
(4CH, axis, and
situs)
RVOT:             Previously seen        Lower              Previously seen
Extremities:
LVOT:             Previously seen        Upper              Previously seen
Extremities:

Other:   Female gender previously seen. Heels and 5th digit previously
seen. Technically difficult due to  maternal habitus.
Cervix Uterus Adnexa

Cervical Length:    3         cm

Cervix:       Normal appearance by transabdominal scan.

Left Ovary:    Within normal limits.
Right Ovary:   Within normal limits.

Adnexa:     No abnormality visualized.
Impression

Single IUP at 24w 5d
Pre-existing diabetes, obesity
Normal interval anatomy
Fetal growth is appropriate (37th %tile)
Anterior placenta without previa
Normal amniotic fluid volume
Recommendations

Recommend follow-up ultrasound examination in 4 weeks
for interval growth
Fetal echo rescheduled (missed first appointment)

## 2016-11-19 ENCOUNTER — Encounter (HOSPITAL_COMMUNITY): Payer: Self-pay | Admitting: *Deleted

## 2016-11-19 ENCOUNTER — Emergency Department (HOSPITAL_COMMUNITY): Payer: Self-pay

## 2016-11-19 ENCOUNTER — Emergency Department (HOSPITAL_COMMUNITY)
Admission: EM | Admit: 2016-11-19 | Discharge: 2016-11-19 | Disposition: A | Discharge status: Home / Self Care | Payer: Self-pay | Attending: Emergency Medicine | Admitting: Emergency Medicine

## 2016-11-19 DIAGNOSIS — J45909 Unspecified asthma, uncomplicated: Secondary | ICD-10-CM | POA: Insufficient documentation

## 2016-11-19 DIAGNOSIS — Z794 Long term (current) use of insulin: Secondary | ICD-10-CM | POA: Insufficient documentation

## 2016-11-19 DIAGNOSIS — J069 Acute upper respiratory infection, unspecified: Secondary | ICD-10-CM | POA: Insufficient documentation

## 2016-11-19 DIAGNOSIS — E119 Type 2 diabetes mellitus without complications: Secondary | ICD-10-CM | POA: Insufficient documentation

## 2016-11-19 DIAGNOSIS — B9789 Other viral agents as the cause of diseases classified elsewhere: Secondary | ICD-10-CM

## 2016-11-19 DIAGNOSIS — I10 Essential (primary) hypertension: Secondary | ICD-10-CM | POA: Insufficient documentation

## 2016-11-19 MED ORDER — IPRATROPIUM BROMIDE 0.02 % IN SOLN
0.5000 mg | Freq: Once | RESPIRATORY_TRACT | Status: AC
  Administered 2016-11-19: 0.5 mg via RESPIRATORY_TRACT
  Filled 2016-11-19: qty 2.5

## 2016-11-19 MED ORDER — ALBUTEROL SULFATE (2.5 MG/3ML) 0.083% IN NEBU
5.0000 mg | INHALATION_SOLUTION | Freq: Once | RESPIRATORY_TRACT | Status: AC
  Administered 2016-11-19: 5 mg via RESPIRATORY_TRACT
  Filled 2016-11-19: qty 6

## 2016-11-19 MED ORDER — ALBUTEROL SULFATE HFA 108 (90 BASE) MCG/ACT IN AERS: 2.0000 | INHALATION_SPRAY | RESPIRATORY_TRACT | 0 refills | Status: AC | PRN

## 2016-11-19 NOTE — Discharge Instructions (Signed)
Continue to stay well-hydrated. Gargle warm salt water and spit it out. Use chloraseptic spray as needed for sore throat. Continue to alternate between Tylenol and Ibuprofen for pain or fever. Use Mucinex for cough suppression/expectoration of mucus. Use netipot and flonase to help with nasal congestion. May consider over-the-counter Benadryl or other antihistamine to decrease secretions and for help with your symptoms. Use inhaler as directed, as needed for cough/chest congestion/wheezing/shortness of breath. Follow up with your primary care doctor in 5-7 days for recheck of ongoing symptoms. Return to emergency department for emergent changing or worsening of symptoms.  °

## 2016-11-19 NOTE — ED Notes (Signed)
Pt reports continued coughing but less often.

## 2016-11-19 NOTE — ED Provider Notes (Signed)
[facility] DEPT Provider Note   CSN: [id number] Arrival date & time: 11/19/16  0946    By signing my name below, I, Carmen Watts, attest that this documentation has been prepared under the direction and in the presence of Carmen Wendell Camprubi-Soms, PA-C. Electronically Signed: Macon Watts, ED Scribe. 11/19/16. 10:12 AM.  History   Chief Complaint Chief Complaint  Patient presents with  . Cough   The history is provided by the patient. No language interpreter was used.  URI   This is a new problem. The current episode started more than 2 days ago. The problem has not changed since onset.There has been no fever. Associated symptoms include congestion, rhinorrhea, sneezing, sore throat, cough and wheezing. Pertinent negatives include no chest pain, no abdominal pain, no diarrhea, no nausea, no vomiting, no dysuria and no ear pain. She has tried other medications for the symptoms. The treatment provided mild relief.   HPI Comments: Carmen Watts is a 36 y.o. female with PMHx of asthma, DM2, and HTN, who presents to the Emergency Department complaining of moderate, constant congestion and URI symptoms onset four days ago. Pt notes having a frequent cough with "greenish-yellowish" sputum production, followed by chills, sore throat, green/yellow rhinorrhea, and sneezing. Pt states her "throat is burning and is on fire" accompanied by chest tightness and wheezing related to the coughing. Pt notes taking Tylenol cold/flu, DayQuil/Nyquil, Mucinex, and Tylenol with minimal relief. Pt reports recent sick contact (one year old daughter) at home. No known aggravating factors. She denies fever, ear pain/drainage, drooling, trismus, trouble swallowing, CP, SOB, abdominal pain, n/v/d/c, dysuria, hematuria, myalgias, arthralgias, rashes, numbness, weakness, tingling, or any other complaints at this time.   Past Medical History:  Diagnosis Date  . Asthma   . Chlamydia   . Diabetes mellitus   .  Gonorrhea   . Headache(784.0)   . Hypertension   . Ovarian cyst     Patient Active Problem List   Diagnosis Date Noted  . Preeclampsia in postpartum period 11/25/2015  . Diabetes type 2, controlled ([name]) 04/26/2014  . RUQ pain 07/28/2012  . 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 Neuropathic pain 10/17/2011    Past Surgical History:  Procedure Laterality Date  . CESAREAN SECTION     x3  . CESAREAN SECTION N/A 11/11/2015   Procedure: CESAREAN SECTION;  Surgeon: Truett Mainland, DO;  Location: [facility];  Service: Obstetrics;  Laterality: N/A;  . THERAPEUTIC ABORTION      OB History    Gravida Para Term Preterm AB Living   _0 0 2 4   SAB TAB Ectopic Multiple Live Births   0 2 0 0 4       Home Medications    Prior to Admission medications   Medication Sig Start Date End Date Taking? Authorizing Provider  ACCU-CHEK FASTCLIX LANCETS MISC Inject 1 each into the skin 4 (four) times daily. 104.24 for testing 4 times daily 08/15/15   [name], MD  albuterol (PROVENTIL HFA;VENTOLIN HFA) 108 (90 BASE) MCG/ACT inhaler Inhale 2 puffs into the lungs every 6 (six) hours as needed for shortness of breath. For asthma syptoms    Historical Provider, MD  amLODipine (NORVASC) 5 MG tablet Take 1 tablet (5 mg total) by mouth daily. 11/27/15   [name], MD  Blood Glucose Monitoring Suppl (ACCU-CHEK NANO SMARTVIEW) W/DEVICE KIT 1 Units by Does not apply route 3 (three) times daily. 09/26/15   [name],  MD  ferrous sulfate 325 (65 FE) MG tablet Take 325 mg by mouth daily with breakfast.    Historical Provider, MD  glucose blood test strip Use as instructed 08/15/15   [name], MD  insulin aspart (NOVOLOG) 100 UNIT/ML injection Inject 10 Units into the skin 3 (three) times daily before meals. 12/28/15   [name], NP  insulin glargine (LANTUS) 100 UNIT/ML injection Inject 0.1 mLs (10 Units total) into the skin daily. 11/14/15   [name], MD  Insulin Syringe-Needle U-100 30G X 5/16" 0.5 ML MISC 1 each by Does not apply route 3 (three) times daily before meals. 12/28/15   [name], NP  nystatin cream (MYCOSTATIN) Apply 1 application topically daily as needed for dry skin. Reported on 12/28/2015 10/03/15   Historical Provider, MD    Family History Family History  Problem Relation Age of Onset  . Thyroid disease Mother   . Diabetes Father   . Hypertension Father   . Ovarian cancer Maternal Grandmother   . Breast cancer Maternal Grandmother   . Cancer Maternal Grandmother   . Cancer Maternal Grandfather   . Lung cancer Maternal Grandfather   . Asthma Son     Social History Social History  Substance Use Topics  . Smoking status: Never Smoker  . Smokeless tobacco: Never Used  . Alcohol use No     Comment: Socially before pregnancy     Allergies   Other   Review of Systems Review of Systems  Constitutional: Positive for chills. Negative for fever.  HENT: Positive for congestion, rhinorrhea, sneezing and sore throat. Negative for drooling, ear discharge, ear pain and trouble swallowing.   Respiratory: Positive for cough, chest tightness and wheezing. Negative for shortness of breath.   Cardiovascular: Negative for chest pain.  Gastrointestinal: Negative for abdominal pain, constipation, diarrhea, nausea and vomiting.  Genitourinary: Negative for dysuria and hematuria.  Musculoskeletal: Negative for arthralgias and myalgias.  Skin: Negative for color change.  Allergic/Immunologic: Positive for immunocompromised state (DM2).  Neurological: Negative for weakness and numbness.  Psychiatric/Behavioral: Negative for confusion.  10 Systems reviewed and all are negative for acute change except as noted in the HPI.    Physical Exam Updated Vital Signs BP 148/90 (BP Location: Left Arm)   Pulse 89   Temp 99 F (37.2 C) (Oral)   Resp 24   LMP 11/15/2016   SpO2 100%   Physical Exam  Constitutional: She  is oriented to person, place, and time. Vital signs are normal. She appears well-developed and well-nourished.  Non-toxic appearance. No distress.  Afebrile, nontoxic, NAD  HENT:  Head: Normocephalic and atraumatic.  Nose: Mucosal edema and rhinorrhea present.  Mouth/Throat: Uvula is midline, oropharynx is clear and moist and mucous membranes are normal. No trismus in the jaw. No uvula swelling. Tonsils are 0 on the right. Tonsils are 0 on the left. No tonsillar exudate.  Nose with mild mucosal edema and rhinorrhea. Oropharynx clear and moist, without uvular swelling or deviation, no trismus or drooling, no tonsillar swelling or erythema, no exudates.   Eyes: Conjunctivae and EOM are normal. Right eye exhibits no discharge. Left eye exhibits no discharge.  Neck: Normal range of motion. Neck supple.  Cardiovascular: Normal rate, regular rhythm, normal heart sounds and intact distal pulses.  Exam reveals no gallop and no friction rub.   No murmur heard. RRR, nl s1/s2, no m/r/g, distal pulses intact, no pedal edema  Pulmonary/Chest: Effort normal. No respiratory distress. She has  decreased breath sounds in the left lower field. She has wheezes. She has no rhonchi. She has no rales. She exhibits no tenderness, no crepitus, no deformity and no retraction.  Mild diminished lung sounds in the LLF, faint expiratory wheezing throughout , no rhonchi or rales, no hypoxia or increased WOB, speaking in full sentences, SpO2 100% on RA Chest wall nonTTP without crepitus, deformities, or retractions  Abdominal: Soft. Normal appearance and bowel sounds are normal. She exhibits no distension. There is no tenderness. There is no rigidity, no rebound, no guarding, no CVA tenderness, no tenderness at McBurney's point and negative Murphy's sign.  Musculoskeletal: Normal range of motion.  Neurological: She is alert and oriented to person, place, and time. She has normal strength. No sensory deficit.  Skin: Skin is warm,  dry and intact. No rash noted.  Psychiatric: She has a normal mood and affect.  Nursing note and vitals reviewed.    ED Treatments / Results   DIAGNOSTIC STUDIES: Oxygen Saturation is 100% on RA, normal by my interpretation.    COORDINATION OF CARE: 10:05 AM Discussed treatment plan with pt at bedside which includes chest imaging and breathing treatment and pt agreed to plan.   Labs (all labs ordered are listed, but only abnormal results are displayed) Labs Reviewed - No data to display  EKG  EKG Interpretation None       Radiology Dg Chest 2 View  Result Date: 11/19/2016 CLINICAL DATA:  Pt c/o productive cough and chest congestion for 5 days with no relief. Pt states that she was given a breathing treatment today and doesn't feel that it has changed any of her symptoms. Pt reports that she had the same symptoms at Thanksgiving that lasted for about 2 weeks. Non smoker. Type 2 diabetic. No further chest history per pt. EXAM: CHEST  2 VIEW COMPARISON:  02/11/2014 FINDINGS: The heart size and mediastinal contours are within normal limits. Both lungs are clear. No pleural effusion or pneumothorax. The visualized skeletal structures are unremarkable. IMPRESSION: Normal chest radiographs. Electronically Signed   By: Amie Portland M.D.   On: 11/19/2016 11:10    Procedures Procedures (including critical care time)  Medications Ordered in ED Medications  albuterol (PROVENTIL) (2.5 MG/3ML) 0.083% nebulizer solution 5 mg (5 mg Nebulization Given 11/19/16 1018)  ipratropium (ATROVENT) nebulizer solution 0.5 mg (0.5 mg Nebulization Given 11/19/16 1018)     Initial Impression / Assessment and Plan / ED Course  I have reviewed the triage vital signs and the nursing notes.  Pertinent labs & imaging results that were available during my care of the patient were reviewed by me and considered in my medical decision making (see chart for details).  Clinical Course     36 y.o. female here  with cough/congestion/URI symptoms x4 days. Pt is afebrile with a clear throat exam. Mild rhinorrhea. Lung sounds with mild wheezing and slightly diminished lung sounds in LLF; will obtain CXR. Will give duoneb then reassess shortly  11:40 AM CXR neg. Lung sounds improved after breathing treatment and pt feels better. Likely viral URI. Rx inhaler given, doubt need for prednisone especially since this will spike her sugars.  Pt is agreeable to symptomatic treatment with close follow up with PCP as needed but spoke at length about emergent changing or worsening of symptoms that should prompt return to ER. Pt voices understanding and is agreeable to plan. Stable at time of discharge.   I personally performed the services described in this documentation, which  was scribed in my presence. The recorded information has been reviewed and is accurate.   Final Clinical Impressions(s) / ED Diagnoses   Final diagnoses:  Viral URI with cough    New Prescriptions New Prescriptions   ALBUTEROL (PROVENTIL HFA;VENTOLIN HFA) 108 (90 BASE) MCG/ACT INHALER    Inhale 2 puffs into the lungs every 4 (four) hours as needed for wheezing or shortness of breath (cough).     Michaelene Dutan Camprubi-Soms, PA-C 11/19/16 [place], MD 11/21/16 [phone]

## 2016-11-19 NOTE — ED Triage Notes (Signed)
Pt reports cough, congestion, chills, runny nose for 1 week.

## 2016-11-19 NOTE — ED Notes (Signed)
Gave pt sprite 

## 2017-07-16 ENCOUNTER — Encounter (HOSPITAL_COMMUNITY): Payer: Self-pay

## 2017-07-16 ENCOUNTER — Inpatient Hospital Stay (HOSPITAL_COMMUNITY)
Admission: AD | Admit: 2017-07-16 | Discharge: 2017-07-16 | Disposition: A | Discharge status: Home / Self Care | Payer: 59 | Source: Ambulatory Visit | Attending: Family Medicine | Admitting: Family Medicine

## 2017-07-16 DIAGNOSIS — A599 Trichomoniasis, unspecified: Secondary | ICD-10-CM

## 2017-07-16 DIAGNOSIS — Z9851 Tubal ligation status: Secondary | ICD-10-CM | POA: Insufficient documentation

## 2017-07-16 DIAGNOSIS — Z794 Long term (current) use of insulin: Secondary | ICD-10-CM | POA: Insufficient documentation

## 2017-07-16 DIAGNOSIS — E119 Type 2 diabetes mellitus without complications: Secondary | ICD-10-CM | POA: Diagnosis not present

## 2017-07-16 DIAGNOSIS — Z833 Family history of diabetes mellitus: Secondary | ICD-10-CM | POA: Insufficient documentation

## 2017-07-16 DIAGNOSIS — Z825 Family history of asthma and other chronic lower respiratory diseases: Secondary | ICD-10-CM | POA: Insufficient documentation

## 2017-07-16 DIAGNOSIS — Z801 Family history of malignant neoplasm of trachea, bronchus and lung: Secondary | ICD-10-CM | POA: Diagnosis not present

## 2017-07-16 DIAGNOSIS — N760 Acute vaginitis: Secondary | ICD-10-CM

## 2017-07-16 DIAGNOSIS — A5901 Trichomonal vulvovaginitis: Secondary | ICD-10-CM | POA: Insufficient documentation

## 2017-07-16 DIAGNOSIS — Z91018 Allergy to other foods: Secondary | ICD-10-CM | POA: Diagnosis not present

## 2017-07-16 DIAGNOSIS — J45909 Unspecified asthma, uncomplicated: Secondary | ICD-10-CM | POA: Insufficient documentation

## 2017-07-16 DIAGNOSIS — Z9889 Other specified postprocedural states: Secondary | ICD-10-CM | POA: Insufficient documentation

## 2017-07-16 DIAGNOSIS — Z8619 Personal history of other infectious and parasitic diseases: Secondary | ICD-10-CM | POA: Diagnosis not present

## 2017-07-16 DIAGNOSIS — Z803 Family history of malignant neoplasm of breast: Secondary | ICD-10-CM | POA: Diagnosis not present

## 2017-07-16 DIAGNOSIS — B9689 Other specified bacterial agents as the cause of diseases classified elsewhere: Secondary | ICD-10-CM | POA: Insufficient documentation

## 2017-07-16 DIAGNOSIS — Z8249 Family history of ischemic heart disease and other diseases of the circulatory system: Secondary | ICD-10-CM | POA: Diagnosis not present

## 2017-07-16 DIAGNOSIS — I10 Essential (primary) hypertension: Secondary | ICD-10-CM | POA: Diagnosis not present

## 2017-07-16 DIAGNOSIS — Z809 Family history of malignant neoplasm, unspecified: Secondary | ICD-10-CM | POA: Insufficient documentation

## 2017-07-16 DIAGNOSIS — N83209 Unspecified ovarian cyst, unspecified side: Secondary | ICD-10-CM | POA: Insufficient documentation

## 2017-07-16 DIAGNOSIS — N898 Other specified noninflammatory disorders of vagina: Secondary | ICD-10-CM | POA: Diagnosis present

## 2017-07-16 DIAGNOSIS — Z8041 Family history of malignant neoplasm of ovary: Secondary | ICD-10-CM | POA: Insufficient documentation

## 2017-07-16 LAB — URINALYSIS, ROUTINE W REFLEX MICROSCOPIC
BACTERIA UA: NONE SEEN
BILIRUBIN URINE: NEGATIVE
Glucose, UA: >=500 mg/dL — AB
Ketones, ur: 5 mg/dL — AB
LEUKOCYTES UA: NEGATIVE
NITRITE: NEGATIVE
PH: 5 (ref 5.0–8.0)
Protein, ur: NEGATIVE mg/dL
SPECIFIC GRAVITY, URINE: 1.033 — AB (ref 1.005–1.030)

## 2017-07-16 LAB — WET PREP, GENITAL
Clue Cells Wet Prep HPF POC: NONE SEEN
SPERM: NONE SEEN
Yeast Wet Prep HPF POC: NONE SEEN

## 2017-07-16 MED ORDER — METRONIDAZOLE 500 MG PO TABS
2000.0000 mg | ORAL_TABLET | Freq: Once | ORAL | Status: AC
  Administered 2017-07-16: 2000 mg via ORAL
  Filled 2017-07-16: qty 4

## 2017-07-16 MED ORDER — TERCONAZOLE 0.8 % VA CREA: 1.0000 | TOPICAL_CREAM | Freq: Every day | VAGINAL | 0 refills | Status: DC

## 2017-07-16 MED ORDER — FLUCONAZOLE 150 MG PO TABS
150.0000 mg | ORAL_TABLET | Freq: Every day | ORAL | 1 refills | Status: DC
Start: 2017-07-16 — End: 2018-04-23

## 2017-07-16 NOTE — Discharge Instructions (Signed)

## 2017-07-16 NOTE — MAU Note (Signed)
Reports she is a diabetic and that her sugars have been elevated. Vaginal itching inside and outside. Some increased discharge. Reports she used Monistat last week with no relief.

## 2017-07-16 NOTE — MAU Provider Note (Signed)
History     CSN: [id number]  Arrival date and time: 07/16/17 4097  First Provider Initiated Contact with Patient 07/16/17 [phone]      Chief Complaint  Patient presents with  . Vaginal Discharge  . Vaginal Itching   HPI Carmen Watts is a 36 y.o. [id number] female who presents with vaginal discharge & irritation. Symptoms began last week. Reports white curd-like discharge, no odor. Associated with vaginal itching & burning. Patient is sexually active with 1 partner; denies concerns for STI. Denies abdominal pain, dysuria, vaginal bleeding, or vaginal lesions. Patient is diabetic & being followed by PCP; next appointment is next week.   Past Medical History:  Diagnosis Date  . Asthma   . Chlamydia   . Diabetes mellitus   . Gonorrhea   . Headache(784.0)   . Hypertension   . Ovarian cyst     Past Surgical History:  Procedure Laterality Date  . CESAREAN SECTION     x3  . CESAREAN SECTION N/A 11/11/2015   Procedure: CESAREAN SECTION;  Surgeon: Truett Mainland, DO;  Location: [facility];  Service: Obstetrics;  Laterality: N/A;  . THERAPEUTIC ABORTION    . TUBAL LIGATION      Family History  Problem Relation Age of Onset  . Thyroid disease Mother   . Diabetes Father   . Hypertension Father   . Ovarian cancer Maternal Grandmother   . Breast cancer Maternal Grandmother   . Cancer Maternal Grandmother   . Cancer Maternal Grandfather   . Lung cancer Maternal Grandfather   . Asthma Son     Social History  Substance Use Topics  . Smoking status: Never Smoker  . Smokeless tobacco: Never Used  . Alcohol use No     Comment: Socially before pregnancy    Allergies:  Allergies  Allergen Reactions  . Other Shortness Of Breath and Itching    Fresh fruit    Prescriptions Prior to Admission  Medication Sig Dispense Refill Last Dose  . ACCU-CHEK FASTCLIX LANCETS MISC Inject 1 each into the skin 4 (four) times daily. 648.83 for testing 4 times daily 102 each 12 Taking  . albuterol  (PROVENTIL HFA;VENTOLIN HFA) 108 (90 BASE) MCG/ACT inhaler Inhale 2 puffs into the lungs every 6 (six) hours as needed for shortness of breath. For asthma syptoms   Taking  . albuterol (PROVENTIL HFA;VENTOLIN HFA) 108 (90 Base) MCG/ACT inhaler Inhale 2 puffs into the lungs every 4 (four) hours as needed for wheezing or shortness of breath (cough). 1 Inhaler 0   . amLODipine (NORVASC) 5 MG tablet Take 1 tablet (5 mg total) by mouth daily. 30 tablet 1 Taking  . Blood Glucose Monitoring Suppl (ACCU-CHEK NANO SMARTVIEW) W/DEVICE KIT 1 Units by Does not apply route 3 (three) times daily. 1 kit 0 Taking  . ferrous sulfate 325 (65 FE) MG tablet Take 325 mg by mouth daily with breakfast.   Taking  . glucose blood test strip Use as instructed 120 each 12 Taking  . insulin aspart (NOVOLOG) 100 UNIT/ML injection Inject 10 Units into the skin 3 (three) times daily before meals. 10 mL 11   . insulin glargine (LANTUS) 100 UNIT/ML injection Inject 0.1 mLs (10 Units total) into the skin daily. 10 mL 11 Taking  . Insulin Syringe-Needle U-100 30G X 5/16" 0.5 ML MISC 1 each by Does not apply route 3 (three) times daily before meals. 90 each 11   . nystatin cream (MYCOSTATIN) Apply 1 application topically daily as needed  for dry skin. Reported on 12/28/2015  0 Not Taking    Review of Systems  Constitutional: Negative.   Gastrointestinal: Negative for abdominal pain, nausea and vomiting.  Endocrine: Negative for polydipsia and polyuria.  Genitourinary: Positive for vaginal discharge. Negative for dyspareunia, dysuria, genital sores and vaginal bleeding.       + vaginal itching/burning   Physical Exam   Blood pressure 123/82, pulse 84, temperature 98.1 F (36.7 C), temperature source Oral, resp. rate 18, last menstrual period 06/17/2017, not currently breastfeeding.  Physical Exam  Nursing note and vitals reviewed. Constitutional: She is oriented to person, place, and time. She appears well-developed and  well-nourished. No distress.  HENT:  Head: Normocephalic and atraumatic.  Eyes: Conjunctivae are normal. Right eye exhibits no discharge. Left eye exhibits no discharge. No scleral icterus.  Respiratory: Effort normal. No respiratory distress.  GI: Soft. There is no tenderness.  Genitourinary: Cervix exhibits friability. Cervix exhibits no motion tenderness and no discharge. There is erythema and tenderness in the vagina. No bleeding in the vagina. Vaginal discharge (small amount of thick white discharge) found.  Neurological: She is alert and oriented to person, place, and time.  Skin: Skin is warm and dry. She is not diaphoretic.  Psychiatric: She has a normal mood and affect. Her behavior is normal. Judgment and thought content normal.    MAU Course  Procedures Results for orders placed or performed during the hospital encounter of 07/16/17 (from the past 24 hour(s))  Wet prep, genital     Status: Abnormal   Collection Time: 07/16/17  8:20 AM  Result Value Ref Range   Yeast Wet Prep HPF POC NONE SEEN NONE SEEN   Trich, Wet Prep PRESENT (A) NONE SEEN   Clue Cells Wet Prep HPF POC NONE SEEN NONE SEEN   WBC, Wet Prep HPF POC MODERATE (A) NONE SEEN   Sperm NONE SEEN     MDM GC/CT & wet prep Wet prep + trich ----- flagyl 2 gm PO in MAU Will rx diflucan & terazol per patient request. Expedited partner tx provided.  Stressed importance of keeping f/u appt with PCP next week to continue management of diabetes. Knows to inform PCP that she is due for a pap smear.   Assessment and Plan  A; 1. Trichomoniasis   2. Vulvovaginitis    P: Discharge home Rx terazol & diflucan EPT Rx & info sheet given No intercourse x 1 week Keep follow up appointment with PCP  GC/CT pending   [name] 07/16/2017, 8:12 AM

## 2017-07-17 LAB — GC/CHLAMYDIA PROBE AMP (~~LOC~~) NOT AT ARMC
Chlamydia: NEGATIVE
Neisseria Gonorrhea: NEGATIVE

## 2017-09-09 ENCOUNTER — Ambulatory Visit (HOSPITAL_COMMUNITY)
Admission: EM | Admit: 2017-09-09 | Discharge: 2017-09-09 | Disposition: A | Discharge status: Home / Self Care | Payer: 59 | Attending: Family Medicine | Admitting: Family Medicine

## 2017-09-09 ENCOUNTER — Encounter (HOSPITAL_COMMUNITY): Payer: Self-pay | Admitting: Physician Assistant

## 2017-09-09 DIAGNOSIS — Z79899 Other long term (current) drug therapy: Secondary | ICD-10-CM | POA: Diagnosis not present

## 2017-09-09 DIAGNOSIS — Z833 Family history of diabetes mellitus: Secondary | ICD-10-CM | POA: Insufficient documentation

## 2017-09-09 DIAGNOSIS — J029 Acute pharyngitis, unspecified: Secondary | ICD-10-CM | POA: Insufficient documentation

## 2017-09-09 DIAGNOSIS — Z794 Long term (current) use of insulin: Secondary | ICD-10-CM | POA: Diagnosis not present

## 2017-09-09 DIAGNOSIS — Z803 Family history of malignant neoplasm of breast: Secondary | ICD-10-CM | POA: Insufficient documentation

## 2017-09-09 DIAGNOSIS — Z801 Family history of malignant neoplasm of trachea, bronchus and lung: Secondary | ICD-10-CM | POA: Diagnosis not present

## 2017-09-09 DIAGNOSIS — R1310 Dysphagia, unspecified: Secondary | ICD-10-CM | POA: Diagnosis not present

## 2017-09-09 DIAGNOSIS — Z825 Family history of asthma and other chronic lower respiratory diseases: Secondary | ICD-10-CM | POA: Insufficient documentation

## 2017-09-09 DIAGNOSIS — E119 Type 2 diabetes mellitus without complications: Secondary | ICD-10-CM | POA: Diagnosis not present

## 2017-09-09 DIAGNOSIS — R160 Hepatomegaly, not elsewhere classified: Secondary | ICD-10-CM | POA: Insufficient documentation

## 2017-09-09 DIAGNOSIS — Z8041 Family history of malignant neoplasm of ovary: Secondary | ICD-10-CM | POA: Diagnosis not present

## 2017-09-09 DIAGNOSIS — Z9889 Other specified postprocedural states: Secondary | ICD-10-CM | POA: Insufficient documentation

## 2017-09-09 DIAGNOSIS — I10 Essential (primary) hypertension: Secondary | ICD-10-CM | POA: Diagnosis not present

## 2017-09-09 DIAGNOSIS — Z8249 Family history of ischemic heart disease and other diseases of the circulatory system: Secondary | ICD-10-CM | POA: Insufficient documentation

## 2017-09-09 DIAGNOSIS — J45909 Unspecified asthma, uncomplicated: Secondary | ICD-10-CM | POA: Diagnosis not present

## 2017-09-09 LAB — POCT RAPID STREP A: STREPTOCOCCUS, GROUP A SCREEN (DIRECT): NEGATIVE

## 2017-09-09 MED ORDER — PHENOL 1.4 % MT LIQD: 1.0000 | OROMUCOSAL | 0 refills | Status: DC | PRN

## 2017-09-09 NOTE — ED Triage Notes (Signed)
Pt here for tonsilar swelling since last night. Slight fever and tachy in triage.

## 2017-09-09 NOTE — ED Provider Notes (Signed)
[facility]    CSN: [id number] Arrival date & time: 09/09/17  1000     History   Chief Complaint Chief Complaint  Patient presents with  . Sore Throat    HPI Carmen Watts is a 36 y.o. female.   36 year old female with history of asthma, DM, HTN comes in for 1 day history of tonsillar swelling. She states she went to bed asymptomatic, and woke up early this morning with sore throat and noticed tonsillar swelling with white patches. Dysphagia without trouble breathing.  Denies chest pain, shortness of breath, wheezing. Denies cough, congestion, ear pain, eye pain. Denies fever, chills, night sweats. Has not tried anything for the symptoms. History of mono. History of seasonal allergies, not currently on any antihistamines. Never smoker. DM stable but slightly above goal, last a1c 8.      Past Medical History:  Diagnosis Date  . Asthma   . Chlamydia   . Diabetes mellitus   . Gonorrhea   . Headache(784.0)   . Hypertension   . Ovarian cyst     Patient Active Problem List   Diagnosis Date Noted  . Preeclampsia in postpartum period 11/25/2015  . Diabetes type 2, controlled ([place]) 04/26/2014  . RUQ pain 07/28/2012  . 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 Neuropathic pain 10/17/2011    Past Surgical History:  Procedure Laterality Date  . CESAREAN SECTION     x3  . CESAREAN SECTION N/A 11/11/2015   Procedure: CESAREAN SECTION;  Surgeon: Truett Mainland, DO;  Location: [facility];  Service: Obstetrics;  Laterality: N/A;  . THERAPEUTIC ABORTION    . TUBAL LIGATION      OB History    Gravida Para Term Preterm AB Living   _0 0 2 4   SAB TAB Ectopic Multiple Live Births   0 2 0 0 4       Home Medications    Prior to Admission medications   Medication Sig Start Date End Date Taking? Authorizing Provider  ACCU-CHEK FASTCLIX LANCETS MISC Inject 1 each into the skin 4 (four) times daily. 648.83 for testing 4 times daily 08/15/15   [name], MD  albuterol (PROVENTIL HFA;VENTOLIN HFA) 108 (90 BASE) MCG/ACT inhaler Inhale 2 puffs into the lungs every 6 (six) hours as needed for shortness of breath. For asthma syptoms    [provider]  albuterol (PROVENTIL HFA;VENTOLIN HFA) 108 (90 Base) MCG/ACT inhaler Inhale 2 puffs into the lungs every 4 (four) hours as needed for wheezing or shortness of breath (cough). 11/19/16   Street, Mercedes, PA-C  amLODipine (NORVASC) 5 MG tablet Take 1 tablet (5 mg total) by mouth daily. 11/27/15   [name], MD  Blood Glucose Monitoring Suppl (ACCU-CHEK NANO SMARTVIEW) W/DEVICE KIT 1 Units by Does not apply route 3 (three) times daily. 09/26/15   [name], MD  ferrous sulfate 325 (65 FE) MG tablet Take 325 mg by mouth daily with breakfast.    [provider]  fluconazole (DIFLUCAN) 150 MG tablet Take 1 tablet (150 mg total) by mouth daily. 07/16/17   [name], NP  glucose blood test strip Use as instructed 08/15/15   [name], MD  insulin aspart (NOVOLOG) 100 UNIT/ML injection Inject 10 Units into the skin 3 (three) times daily before meals. 12/28/15   [name], NP  insulin glargine (LANTUS) 100 UNIT/ML injection Inject 0.1 mLs (10 Units total) into the skin  daily. 11/14/15   Tonette Bihari, MD  Insulin Syringe-Needle U-100 30G X 5/16" 0.5 ML MISC 1 each by Does not apply route 3 (three) times daily before meals. 12/28/15   [name], NP  nystatin cream (MYCOSTATIN) Apply 1 application topically daily as needed for dry skin. Reported on 12/28/2015 10/03/15   [provider]  phenol (CHLORASEPTIC) 1.4 % LIQD Use as directed 1 spray in the mouth or throat as needed for throat irritation / pain. 09/09/17   Tasia Catchings, Yadier Bramhall V, PA-C  terconazole (TERAZOL 3) 0.8 % vaginal cream Place 1 applicator vaginally at bedtime. 07/16/17   [name], NP    Family History Family History  Problem Relation Age of Onset  . Thyroid disease Mother     . Diabetes Father   . Hypertension Father   . Ovarian cancer Maternal Grandmother   . Breast cancer Maternal Grandmother   . Cancer Maternal Grandmother   . Cancer Maternal Grandfather   . Lung cancer Maternal Grandfather   . Asthma Son     Social History Social History  Substance Use Topics  . Smoking status: Never Smoker  . Smokeless tobacco: Never Used  . Alcohol use No     Comment: Socially before pregnancy     Allergies   Other   Review of Systems Review of Systems  Reason unable to perform ROS: See HPI as above.     Physical Exam Triage Vital Signs ED Triage Vitals  Enc Vitals Group     BP 09/09/17 1012 (!) 147/93     Pulse Rate 09/09/17 1012 (!) 116     Resp 09/09/17 1012 16     Temp 09/09/17 1012 99.6 F (37.6 C)     Temp Source 09/09/17 1012 Oral     SpO2 09/09/17 1012 97 %     Weight 09/09/17 1012 280 lb (127 kg)     Height 09/09/17 1012 _0  (1.6 m)     Head Circumference --      Peak Flow --      Pain Score 09/09/17 1014 10     Pain Loc --      Pain Edu? --      Excl. in [place]? --    No data found.   Updated Vital Signs BP (!) 147/93 (BP Location: Right Arm)   Pulse (!) 116   Temp 99.6 F (37.6 C) (Oral)   Resp 16   Ht _1  (1.6 m)   Wt 280 lb (127 kg)   LMP 08/14/2017   SpO2 97%   BMI 49.60 kg/m   Physical Exam  Constitutional: She is oriented to person, place, and time. She appears well-developed and well-nourished. No distress.  HENT:  Head: Normocephalic and atraumatic.  Right Ear: Tympanic membrane, external ear and ear canal normal. Tympanic membrane is not erythematous and not bulging.  Left Ear: Tympanic membrane, external ear and ear canal normal. Tympanic membrane is not erythematous and not bulging.  Nose: Nose normal. Right sinus exhibits no maxillary sinus tenderness and no frontal sinus tenderness. Left sinus exhibits no maxillary sinus tenderness and no frontal sinus tenderness.  Mouth/Throat: Uvula is midline,  oropharynx is clear and moist and mucous membranes are normal. Tonsils are 2+ on the right. Tonsils are 2+ on the left. Tonsillar exudate (bilateral).  Eyes: Pupils are equal, round, and reactive to light. Conjunctivae are normal.  Neck: Normal range of motion. Neck supple.  Cardiovascular: Regular rhythm and normal heart sounds.  Tachycardia present.  Exam reveals no gallop and no friction rub.   No murmur heard. Pulmonary/Chest: Effort normal and breath sounds normal. She has no decreased breath sounds. She has no wheezes. She has no rhonchi. She has no rales.  Lymphadenopathy:    She has no cervical adenopathy.  Neurological: She is alert and oriented to person, place, and time.  Skin: Skin is warm and dry.  Psychiatric: She has a normal mood and affect. Her behavior is normal. Judgment normal.     UC Treatments / Results  Labs (all labs ordered are listed, but only abnormal results are displayed) Labs Reviewed  CULTURE, GROUP A STREP [facility])  POCT RAPID STREP A    EKG  EKG Interpretation None       Radiology No results found.  Procedures Procedures (including critical care time)  Medications Ordered in UC Medications - No data to display   Initial Impression / Assessment and Plan / UC Course  I have reviewed the triage vital signs and the nursing notes.  Pertinent labs & imaging results that were available during my care of the patient were reviewed by me and considered in my medical decision making (see chart for details).    Rapid strep negative. Symptomatic treatment as needed. Return precautions given.   Final Clinical Impressions(s) / UC Diagnoses   Final diagnoses:  Pharyngitis, unspecified etiology    New Prescriptions New Prescriptions   PHENOL (CHLORASEPTIC) 1.4 % LIQD    Use as directed 1 spray in the mouth or throat as needed for throat irritation / pain.      Ok Edwards, PA-C 09/09/17 1041

## 2017-09-09 NOTE — Discharge Instructions (Addendum)
Rapid strep negative. Symptoms could be due to viral illness. Culture sent, you will be contacted with any positive results, additional treatment needed will be called in then. Start phenol for sore throat. Tylenol/motrin for pain and fever. Keep hydrated, your urine should be clear to pale yellow in color. Monitor for any worsening of symptoms, swelling of the throat, trouble breathing, trouble swallowing, follow up for reevaluation.   For sore throat try using a honey-based tea. Use 3 teaspoons of honey with juice squeezed from half lemon. Place shaved pieces of ginger into 1/2-1 cup of water and warm over stove top. Then mix the ingredients and repeat every 4 hours as needed.

## 2017-09-11 LAB — CULTURE, GROUP A STREP (THRC)

## 2017-09-12 ENCOUNTER — Telehealth (HOSPITAL_COMMUNITY): Payer: Self-pay | Admitting: Internal Medicine

## 2017-09-12 MED ORDER — PENICILLIN V POTASSIUM 500 MG PO TABS: 500.0000 mg | ORAL_TABLET | Freq: Two times a day (BID) | ORAL | 0 refills | Status: DC

## 2017-09-12 NOTE — Telephone Encounter (Signed)
Clinical staff, please let patient know that throat culture was positive for non group A Strep.  Patient was mildly tachycardic at urgent care visit with abnormal throat exam and severe throat pain.  Rx penicillin V sent to the pharmacy of record, [facility] on [address].  Recheck for further evaluation if symptoms are not improving.  LM

## 2018-02-15 ENCOUNTER — Encounter (HOSPITAL_COMMUNITY): Payer: Self-pay

## 2018-02-15 ENCOUNTER — Other Ambulatory Visit: Payer: Self-pay

## 2018-02-15 ENCOUNTER — Inpatient Hospital Stay (HOSPITAL_COMMUNITY)
Admission: AD | Admit: 2018-02-15 | Discharge: 2018-02-15 | Disposition: A | Discharge status: Home / Self Care | Payer: 59 | Source: Ambulatory Visit | Attending: Obstetrics and Gynecology | Admitting: Obstetrics and Gynecology

## 2018-02-15 DIAGNOSIS — N898 Other specified noninflammatory disorders of vagina: Secondary | ICD-10-CM | POA: Diagnosis present

## 2018-02-15 DIAGNOSIS — Z3202 Encounter for pregnancy test, result negative: Secondary | ICD-10-CM | POA: Insufficient documentation

## 2018-02-15 DIAGNOSIS — B9689 Other specified bacterial agents as the cause of diseases classified elsewhere: Secondary | ICD-10-CM

## 2018-02-15 DIAGNOSIS — N760 Acute vaginitis: Secondary | ICD-10-CM | POA: Diagnosis not present

## 2018-02-15 DIAGNOSIS — E1165 Type 2 diabetes mellitus with hyperglycemia: Secondary | ICD-10-CM | POA: Insufficient documentation

## 2018-02-15 LAB — WET PREP, GENITAL
Sperm: NONE SEEN
TRICH WET PREP: NONE SEEN
Yeast Wet Prep HPF POC: NONE SEEN

## 2018-02-15 LAB — URINALYSIS, ROUTINE W REFLEX MICROSCOPIC
Bacteria, UA: NONE SEEN
Bilirubin Urine: NEGATIVE
Glucose, UA: >=500 mg/dL — AB
Hgb urine dipstick: NEGATIVE
Ketones, ur: NEGATIVE mg/dL
Leukocytes, UA: NEGATIVE
Nitrite: NEGATIVE
Protein, ur: NEGATIVE mg/dL
Specific Gravity, Urine: 1.026 (ref 1.005–1.030)
pH: 5 (ref 5.0–8.0)

## 2018-02-15 LAB — POCT PREGNANCY, URINE: PREG TEST UR: NEGATIVE

## 2018-02-15 LAB — GLUCOSE, CAPILLARY: Glucose-Capillary: 327 mg/dL — ABNORMAL HIGH (ref 65–99)

## 2018-02-15 MED ORDER — METRONIDAZOLE 500 MG PO TABS: 500.0000 mg | ORAL_TABLET | Freq: Two times a day (BID) | ORAL | 0 refills | Status: AC

## 2018-02-15 NOTE — MAU Note (Signed)
Pt presents to MAU c/o vaginal discharge that has a foul odor. Pt reports the discharge started about 1.5weeks ago. Pt reports being a diabetic and having a PRN pill of Diflucan for yeast pt states she has taken the two pills for it and the discharge has not resolved. Pt denies bleeding or cramping. Pt reports itching and burning around her vagina. Pt denies new sexual partners but states when she was here a few months ago she was diagnosed with tric and was treated here in MAU. Last intercourse 3/13.

## 2018-02-15 NOTE — MAU Provider Note (Addendum)
History     CSN: [id number]  Arrival date and time: 02/15/18 0086  Chief Complaint  Patient presents with  . Vaginal Discharge  . Vaginal Itching   HPI Carmen Watts is a 37 y.o. [phone] non pregnant female who presents with vaginal discharge with an odor for 1.5 weeks. She also reports itching and burning at her vagina. She states she tried diflucan for the itching but the discharge and odor just kept getting worse. She denies any bleeding or pain.   OB History    Gravida  6   Para  4   Term  4   Preterm  0   AB  2   Living  4     SAB  0   TAB  2   Ectopic  0   Multiple  0   Live Births  4           Past Medical History:  Diagnosis Date  . Asthma   . Chlamydia   . Diabetes mellitus   . Gonorrhea   . Headache(784.0)   . Hypertension   . Ovarian cyst     Past Surgical History:  Procedure Laterality Date  . CESAREAN SECTION     x3  . CESAREAN SECTION N/A 11/11/2015   Procedure: CESAREAN SECTION;  Surgeon: Truett Mainland, DO;  Location: [facility];  Service: Obstetrics;  Laterality: N/A;  . THERAPEUTIC ABORTION    . TUBAL LIGATION      Family History  Problem Relation Age of Onset  . Thyroid disease Mother   . Diabetes Father   . Hypertension Father   . Ovarian cancer Maternal Grandmother   . Breast cancer Maternal Grandmother   . Cancer Maternal Grandmother   . Cancer Maternal Grandfather   . Lung cancer Maternal Grandfather   . Asthma Son     Social History   Tobacco Use  . Smoking status: Never Smoker  . Smokeless tobacco: Never Used  Substance Use Topics  . Alcohol use: No    Comment: Socially before pregnancy  . Drug use: No    Allergies:  Allergies  Allergen Reactions  . Other Shortness Of Breath and Itching    Fresh fruit    Medications Prior to Admission  Medication Sig Dispense Refill Last Dose  . ACCU-CHEK FASTCLIX LANCETS MISC Inject 1 each into the skin 4 (four) times daily. 648.83 for testing 4 times daily 102  each 12 02/14/2018 at Unknown time  . amLODipine (NORVASC) 5 MG tablet Take 1 tablet (5 mg total) by mouth daily. 30 tablet 1 02/14/2018 at Unknown time  . Blood Glucose Monitoring Suppl (ACCU-CHEK NANO SMARTVIEW) W/DEVICE KIT 1 Units by Does not apply route 3 (three) times daily. 1 kit 0 02/14/2018 at Unknown time  . fluconazole (DIFLUCAN) 150 MG tablet Take 1 tablet (150 mg total) by mouth daily. 1 tablet 1 Past Month at Unknown time  . insulin aspart (NOVOLOG) 100 UNIT/ML injection Inject 10 Units into the skin 3 (three) times daily before meals. 10 mL 11 02/14/2018 at Unknown time  . albuterol (PROVENTIL HFA;VENTOLIN HFA) 108 (90 BASE) MCG/ACT inhaler Inhale 2 puffs into the lungs every 6 (six) hours as needed for shortness of breath. For asthma syptoms   Taking  . albuterol (PROVENTIL HFA;VENTOLIN HFA) 108 (90 Base) MCG/ACT inhaler Inhale 2 puffs into the lungs every 4 (four) hours as needed for wheezing or shortness of breath (cough). 1 Inhaler 0 More than a month at  Unknown time  . ferrous sulfate 325 (65 FE) MG tablet Take 325 mg by mouth daily with breakfast.   More than a month at Unknown time  . glucose blood test strip Use as instructed 120 each 12 Taking  . insulin glargine (LANTUS) 100 UNIT/ML injection Inject 0.1 mLs (10 Units total) into the skin daily. 10 mL 11 More than a month at Unknown time  . Insulin Syringe-Needle U-100 30G X 5/16" 0.5 ML MISC 1 each by Does not apply route 3 (three) times daily before meals. 90 each 11 More than a month at Unknown time  . nystatin cream (MYCOSTATIN) Apply 1 application topically daily as needed for dry skin. Reported on 12/28/2015  0 More than a month at Unknown time  . penicillin v potassium (VEETID) 500 MG tablet Take 1 tablet (500 mg total) by mouth 2 (two) times daily. 20 tablet 0   . phenol (CHLORASEPTIC) 1.4 % LIQD Use as directed 1 spray in the mouth or throat as needed for throat irritation / pain. 1 Bottle 0   . terconazole (TERAZOL 3) 0.8 %  vaginal cream Place 1 applicator vaginally at bedtime. 20 g 0 More than a month at Unknown time    Review of Systems  Constitutional: Negative.  Negative for fatigue and fever.  HENT: Negative.   Respiratory: Negative.  Negative for shortness of breath.   Cardiovascular: Negative.  Negative for chest pain.  Gastrointestinal: Negative.  Negative for abdominal pain, constipation, diarrhea, nausea and vomiting.  Genitourinary: Positive for vaginal discharge. Negative for dysuria and vaginal bleeding.  Neurological: Negative.  Negative for dizziness and headaches.   Physical Exam   Blood pressure 123/72, pulse (!) 102, temperature 98.5 F (36.9 C), temperature source Oral, resp. rate 18, height 5' 3"  (1.6 m), weight 266 lb (120.7 kg), last menstrual period 01/17/2018.  Physical Exam  Nursing note and vitals reviewed. Constitutional: She is oriented to person, place, and time. She appears well-developed and well-nourished. No distress.  HENT:  Head: Normocephalic.  Eyes: Pupils are equal, round, and reactive to light.  Cardiovascular: Normal rate, regular rhythm and normal heart sounds.  Respiratory: Effort normal and breath sounds normal. No respiratory distress.  GI: Soft. Bowel sounds are normal. She exhibits no distension. There is no tenderness.  Neurological: She is alert and oriented to person, place, and time.  Skin: Skin is warm and dry.  Psychiatric: She has a normal mood and affect. Her behavior is normal. Judgment and thought content normal.    MAU Course  Procedures Results for orders placed or performed during the hospital encounter of 02/15/18 (from the past 24 hour(s))  Urinalysis, Routine w reflex microscopic     Status: Abnormal   Collection Time: 02/15/18  7:04 AM  Result Value Ref Range   Color, Urine YELLOW YELLOW   APPearance HAZY (A) CLEAR   Specific Gravity, Urine 1.026 1.005 - 1.030   pH 5.0 5.0 - 8.0   Glucose, UA >=500 (A) NEGATIVE mg/dL   Hgb urine  dipstick NEGATIVE NEGATIVE   Bilirubin Urine NEGATIVE NEGATIVE   Ketones, ur NEGATIVE NEGATIVE mg/dL   Protein, ur NEGATIVE NEGATIVE mg/dL   Nitrite NEGATIVE NEGATIVE   Leukocytes, UA NEGATIVE NEGATIVE   RBC / HPF 0-5 0 - 5 RBC/hpf   WBC, UA 0-5 0 - 5 WBC/hpf   Bacteria, UA NONE SEEN NONE SEEN   Squamous Epithelial / LPF 6-30 (A) NONE SEEN   Mucus PRESENT   Pregnancy, urine POC  Status: None   Collection Time: 02/15/18  7:17 AM  Result Value Ref Range   Preg Test, Ur NEGATIVE NEGATIVE   MDM UA, UPT Wet prep and gc/chlamydia  Care turned over to D. Tyrone Balash CNM at 12.  Carmen Watts, CNM 02/15/18 8:05 AM   Results for orders placed or performed during the hospital encounter of 02/15/18 (from the past 24 hour(s))  Urinalysis, Routine w reflex microscopic     Status: Abnormal   Collection Time: 02/15/18  7:04 AM  Result Value Ref Range   Color, Urine YELLOW YELLOW   APPearance HAZY (A) CLEAR   Specific Gravity, Urine 1.026 1.005 - 1.030   pH 5.0 5.0 - 8.0   Glucose, UA >=500 (A) NEGATIVE mg/dL   Hgb urine dipstick NEGATIVE NEGATIVE   Bilirubin Urine NEGATIVE NEGATIVE   Ketones, ur NEGATIVE NEGATIVE mg/dL   Protein, ur NEGATIVE NEGATIVE mg/dL   Nitrite NEGATIVE NEGATIVE   Leukocytes, UA NEGATIVE NEGATIVE   RBC / HPF 0-5 0 - 5 RBC/hpf   WBC, UA 0-5 0 - 5 WBC/hpf   Bacteria, UA NONE SEEN NONE SEEN   Squamous Epithelial / LPF 6-30 (A) NONE SEEN   Mucus PRESENT   Pregnancy, urine POC     Status: None   Collection Time: 02/15/18  7:17 AM  Result Value Ref Range   Preg Test, Ur NEGATIVE NEGATIVE  Wet prep, genital     Status: Abnormal   Collection Time: 02/15/18  7:45 AM  Result Value Ref Range   Yeast Wet Prep HPF POC NONE SEEN NONE SEEN   Trich, Wet Prep NONE SEEN NONE SEEN   Clue Cells Wet Prep HPF POC PRESENT (A) NONE SEEN   WBC, Wet Prep HPF POC FEW (A) NONE SEEN   Sperm NONE SEEN   Glucose, capillary     Status: Abnormal   Collection Time: 02/15/18  8:27 AM   Result Value Ref Range   Glucose-Capillary 327 (H) 65 - 99 mg/dL   Addendum to history: DM followed by Dr. Anastasia Pall at [facility] and she has appointment there April 9. Has oral hypoglycemic (does not know drug name) but has not taken it today; only on insulin during pregnancy. All recent home CBGs in 300's. Also on Lisinopril 9m qd for CHTN.  Gets frequent candida vaginitis infection and takes prn Diflucan which she last took 2 weeks ago. Vulvovaginal pruritis is worsenig. Hx BTL.   Advised to take chronic meds as directed and recheck WP at 02/25/2018 visit.  Assessment and Plan   1. BV (bacterial vaginosis)   2. Uncontrolled type 2 diabetes mellitus with hyperglycemia (HCC)    Allergies as of 02/15/2018      Reactions   Other Shortness Of Breath, Itching   Fresh fruit      Medication List    STOP taking these medications   ACCU-CHEK FASTCLIX LANCETS Misc   amLODipine 5 MG tablet Commonly known as:  NORVASC   ferrous sulfate 325 (65 FE) MG tablet   insulin aspart 100 UNIT/ML injection Commonly known as:  NOVOLOG   insulin glargine 100 UNIT/ML injection Commonly known as:  LANTUS   Insulin Syringe-Needle U-100 30G X 5/16" 0.5 ML Misc   nystatin cream Commonly known as:  MYCOSTATIN   penicillin v potassium 500 MG tablet Commonly known as:  VEETID   phenol 1.4 % Liqd Commonly known as:  CHLORASEPTIC   terconazole 0.8 % vaginal cream Commonly known as:  TERAZOL 3  TAKE these medications   ACCU-CHEK NANO SMARTVIEW w/Device Kit 1 Units by Does not apply route 3 (three) times daily.   albuterol 108 (90 Base) MCG/ACT inhaler Commonly known as:  PROVENTIL HFA;VENTOLIN HFA Inhale 2 puffs into the lungs every 4 (four) hours as needed for wheezing or shortness of breath (cough). What changed:  Another medication with the same name was removed. Continue taking this medication, and follow the directions you see here.   fluconazole 150 MG tablet Commonly  known as:  DIFLUCAN Take 1 tablet (150 mg total) by mouth daily.   glucose blood test strip Use as instructed   lisinopril 5 MG tablet Commonly known as:  PRINIVIL,ZESTRIL Take 5 mg by mouth daily.   metroNIDAZOLE 500 MG tablet Commonly known as:  FLAGYL Take 1 tablet (500 mg total) by mouth 2 (two) times daily for 7 days.      Follow-up Information    Chesley Noon, MD Follow up on 02/25/2018.   Specialty:  Family Medicine Contact information: [place] [address] Campbell, [place], CNM 02/15/2018 9:57 AM

## 2018-02-15 NOTE — Discharge Instructions (Signed)
Bacterial Vaginosis Bacterial vaginosis is a vaginal infection that occurs when the normal balance of bacteria in the vagina is disrupted. It results from an overgrowth of certain bacteria. This is the most common vaginal infection among women ages 7-44. Because bacterial vaginosis increases your risk for STIs (sexually transmitted infections), getting treated can help reduce your risk for chlamydia, gonorrhea, herpes, and HIV (human immunodeficiency virus). Treatment is also important for preventing complications in pregnant women, because this condition can cause an early (premature) delivery. What are the causes? This condition is caused by an increase in harmful bacteria that are normally present in small amounts in the vagina. However, the reason that the condition develops is not fully understood. What increases the risk? The following factors may make you more likely to develop this condition:  Having a new sexual partner or multiple sexual partners.  Having unprotected sex.  Douching.  Having an intrauterine device (IUD).  Smoking.  Drug and alcohol abuse.  Taking certain antibiotic medicines.  Being pregnant.  You cannot get bacterial vaginosis from toilet seats, bedding, swimming pools, or contact with objects around you. What are the signs or symptoms? Symptoms of this condition include:  Grey or white vaginal discharge. The discharge can also be watery or foamy.  A fish-like odor with discharge, especially after sexual intercourse or during menstruation.  Itching in and around the vagina.  Burning or pain with urination.  Some women with bacterial vaginosis have no signs or symptoms. How is this diagnosed? This condition is diagnosed based on:  Your medical history.  A physical exam of the vagina.  Testing a sample of vaginal fluid under a microscope to look for a large amount of bad bacteria or abnormal cells. Your health care provider may use a cotton swab  or a small wooden spatula to collect the sample.  How is this treated? This condition is treated with antibiotics. These may be given as a pill, a vaginal cream, or a medicine that is put into the vagina (suppository). If the condition comes back after treatment, a second round of antibiotics may be needed. Follow these instructions at home: Medicines  Take over-the-counter and prescription medicines only as told by your health care provider.  Take or use your antibiotic as told by your health care provider. Do not stop taking or using the antibiotic even if you start to feel better. General instructions  If you have a female sexual partner, tell her that you have a vaginal infection. She should see her health care provider and be treated if she has symptoms. If you have a female sexual partner, he does not need treatment.  During treatment: ? Avoid sexual activity until you finish treatment. ? Do not douche. ? Avoid alcohol as directed by your health care provider. ? Avoid breastfeeding as directed by your health care provider.  Drink enough water and fluids to keep your urine clear or pale yellow.  Keep the area around your vagina and rectum clean. ? Wash the area daily with warm water. ? Wipe yourself from front to back after using the toilet.  Keep all follow-up visits as told by your health care provider. This is important. How is this prevented?  Do not douche.  Wash the outside of your vagina with warm water only.  Use protection when having sex. This includes latex condoms and dental dams.  Limit how many sexual partners you have. To help prevent bacterial vaginosis, it is best to have sex with just  one partner (monogamous).  Make sure you and your sexual partner are tested for STIs.  Wear cotton or cotton-lined underwear.  Avoid wearing tight pants and pantyhose, especially during summer.  Limit the amount of alcohol that you drink.  Do not use any products that  contain nicotine or tobacco, such as cigarettes and e-cigarettes. If you need help quitting, ask your health care provider.  Do not use illegal drugs. Where to find more information:  Centers for Disease Control and Prevention: AppraiserFraud.fi  American Sexual Health Association (ASHA): www.ashastd.org  U.S. Department of Health and Financial controller, Office on Women's Health: DustingSprays.pl or SecuritiesCard.it Contact a health care provider if:  Your symptoms do not improve, even after treatment.  You have more discharge or pain when urinating.  You have a fever.  You have pain in your abdomen.  You have pain during sex.  You have vaginal bleeding between periods. Summary  Bacterial vaginosis is a vaginal infection that occurs when the normal balance of bacteria in the vagina is disrupted.  Because bacterial vaginosis increases your risk for STIs (sexually transmitted infections), getting treated can help reduce your risk for chlamydia, gonorrhea, herpes, and HIV (human immunodeficiency virus). Treatment is also important for preventing complications in pregnant women, because the condition can cause an early (premature) delivery.  This condition is treated with antibiotic medicines. These may be given as a pill, a vaginal cream, or a medicine that is put into the vagina (suppository). This information is not intended to replace advice given to you by your health care provider. Make sure you discuss any questions you have with your health care provider. Document Released: 11/05/2005 Document Revised: 03/11/2017 Document Reviewed: 07/21/2016 Elsevier Interactive Patient Education  2018 [employer].   Blood Glucose Monitoring, Adult Monitoring your blood sugar (glucose) helps you manage your diabetes. It also helps you and your health care provider determine how well your diabetes management plan is working. Blood glucose monitoring  involves checking your blood glucose as often as directed, and keeping a record (log) of your results over time. Why should I monitor my blood glucose? Checking your blood glucose regularly can:  Help you understand how food, exercise, illnesses, and medicines affect your blood glucose.  Let you know what your blood glucose is at any time. You can quickly tell if you are having low blood glucose (hypoglycemia) or high blood glucose (hyperglycemia).  Help you and your health care provider adjust your medicines as needed.  When should I check my blood glucose? Follow instructions from your health care provider about how often to check your blood glucose. This may depend on:  The type of diabetes you have.  How well-controlled your diabetes is.  Medicines you are taking.  If you have type 1 diabetes:  Check your blood glucose at least 2 times a day.  Also check your blood glucose: ? Before every insulin injection. ? Before and after exercise. ? Between meals. ? 2 hours after a meal. ? Occasionally between 2:00 a.m. and 3:00 a.m., as directed. ? Before potentially dangerous tasks, like driving or using heavy machinery. ? At bedtime.  You may need to check your blood glucose more often, up to 6-10 times a day: ? If you use an insulin pump. ? If you need multiple daily injections (MDI). ? If your diabetes is not well-controlled. ? If you are ill. ? If you have a history of severe hypoglycemia. ? If you have a history of not knowing when  your blood glucose is getting low (hypoglycemia unawareness). If you have type 2 diabetes:  If you take insulin or other diabetes medicines, check your blood glucose at least 2 times a day.  If you are on intensive insulin therapy, check your blood glucose at least 4 times a day. Occasionally, you may also need to check between 2:00 a.m. and 3:00 a.m., as directed.  Also check your blood glucose: ? Before and after exercise. ? Before  potentially dangerous tasks, like driving or using heavy machinery.  You may need to check your blood glucose more often if: ? Your medicine is being adjusted. ? Your diabetes is not well-controlled. ? You are ill. What is a blood glucose log?  A blood glucose log is a record of your blood glucose readings. It helps you and your health care provider: ? Look for patterns in your blood glucose over time. ? Adjust your diabetes management plan as needed.  Every time you check your blood glucose, write down your result and notes about things that may be affecting your blood glucose, such as your diet and exercise for the day.  Most glucose meters store a record of glucose readings in the meter. Some meters allow you to download your records to a computer. How do I check my blood glucose? Follow these steps to get accurate readings of your blood glucose: Supplies needed   Blood glucose meter.  Test strips for your meter. Each meter has its own strips. You must use the strips that come with your meter.  A needle to prick your finger (lancet). Do not use lancets more than once.  A device that holds the lancet (lancing device).  A journal or log book to write down your results. Procedure  Wash your hands with soap and water.  Prick the side of your finger (not the tip) with the lancet. Use a different finger each time.  Gently rub the finger until a small drop of blood appears.  Follow instructions that come with your meter for inserting the test strip, applying blood to the strip, and using your blood glucose meter.  Write down your result and any notes. Alternative testing sites  Some meters allow you to use areas of your body other than your finger (alternative sites) to test your blood.  If you think you may have hypoglycemia, or if you have hypoglycemia unawareness, do not use alternative sites. Use your finger instead.  Alternative sites may not be as accurate as the  fingers, because blood flow is slower in these areas. This means that the result you get may be delayed, and it may be different from the result that you would get from your finger.  The most common alternative sites are: ? Forearm. ? Thigh. ? Palm of the hand. Additional tips  Always keep your supplies with you.  If you have questions or need help, all blood glucose meters have a 24-hour hotline number that you can call. You may also contact your health care provider.  After you use a few boxes of test strips, adjust (calibrate) your blood glucose meter by following instructions that came with your meter. This information is not intended to replace advice given to you by your health care provider. Make sure you discuss any questions you have with your health care provider. Document Released: 11/08/2003 Document Revised: 05/25/2016 Document Reviewed: 04/16/2016 Elsevier Interactive Patient Education  2017 [employer].  How to Take a [employer] A sitz bath is a  warm water bath that is taken while you are sitting down. The water should only come up to your hips and should cover your buttocks. Your health care provider may recommend a sitz bath to help you:  Clean the lower part of your body, including your genital area.  With itching.  With pain.  With sore muscles or muscles that tighten or spasm.  How to take a sitz bath Take 3-4 sitz baths per day or as told by your health care provider. 1. Partially fill a bathtub with warm water. You will only need the water to be deep enough to cover your hips and buttocks when you are sitting in it. 2. If your health care provider told you to put medicine in the water, follow the directions exactly. 3. Sit in the water and open the tub drain a little. 4. Turn on the warm water again to keep the tub at the correct level. Keep the water running constantly. 5. Soak in the water for 15-20 minutes or as told by your health care  provider. 6. After the sitz bath, pat the affected area dry first. Do not rub it. 7. Be careful when you stand up after the sitz bath because you may feel dizzy.  Contact a health care provider if:  Your symptoms get worse. Do not continue with sitz baths if your symptoms get worse.  You have new symptoms. Do not continue with sitz baths until you talk with your health care provider. This information is not intended to replace advice given to you by your health care provider. Make sure you discuss any questions you have with your health care provider. Document Released: 07/28/2004 Document Revised: 04/04/2016 Document Reviewed: 11/03/2014 Elsevier Interactive Patient Education  [employer].

## 2018-02-17 LAB — GC/CHLAMYDIA PROBE AMP (~~LOC~~) NOT AT ARMC
CHLAMYDIA, DNA PROBE: NEGATIVE
NEISSERIA GONORRHEA: NEGATIVE

## 2018-04-23 ENCOUNTER — Inpatient Hospital Stay (HOSPITAL_COMMUNITY)
Admission: AD | Admit: 2018-04-23 | Discharge: 2018-04-23 | Disposition: A | Discharge status: Home / Self Care | Payer: 59 | Source: Ambulatory Visit | Attending: Obstetrics & Gynecology | Admitting: Obstetrics & Gynecology

## 2018-04-23 ENCOUNTER — Other Ambulatory Visit: Payer: Self-pay

## 2018-04-23 ENCOUNTER — Encounter (HOSPITAL_COMMUNITY): Payer: Self-pay

## 2018-04-23 DIAGNOSIS — Z87891 Personal history of nicotine dependence: Secondary | ICD-10-CM | POA: Insufficient documentation

## 2018-04-23 DIAGNOSIS — N898 Other specified noninflammatory disorders of vagina: Secondary | ICD-10-CM | POA: Diagnosis present

## 2018-04-23 DIAGNOSIS — Z8349 Family history of other endocrine, nutritional and metabolic diseases: Secondary | ICD-10-CM | POA: Diagnosis not present

## 2018-04-23 DIAGNOSIS — B9689 Other specified bacterial agents as the cause of diseases classified elsewhere: Secondary | ICD-10-CM | POA: Insufficient documentation

## 2018-04-23 DIAGNOSIS — E119 Type 2 diabetes mellitus without complications: Secondary | ICD-10-CM | POA: Insufficient documentation

## 2018-04-23 DIAGNOSIS — I10 Essential (primary) hypertension: Secondary | ICD-10-CM | POA: Diagnosis not present

## 2018-04-23 DIAGNOSIS — Z8249 Family history of ischemic heart disease and other diseases of the circulatory system: Secondary | ICD-10-CM | POA: Diagnosis not present

## 2018-04-23 DIAGNOSIS — N760 Acute vaginitis: Secondary | ICD-10-CM | POA: Insufficient documentation

## 2018-04-23 DIAGNOSIS — J45909 Unspecified asthma, uncomplicated: Secondary | ICD-10-CM | POA: Diagnosis not present

## 2018-04-23 DIAGNOSIS — Z8619 Personal history of other infectious and parasitic diseases: Secondary | ICD-10-CM | POA: Insufficient documentation

## 2018-04-23 DIAGNOSIS — Z91018 Allergy to other foods: Secondary | ICD-10-CM | POA: Insufficient documentation

## 2018-04-23 LAB — URINALYSIS, ROUTINE W REFLEX MICROSCOPIC
BILIRUBIN URINE: NEGATIVE
HGB URINE DIPSTICK: NEGATIVE
Ketones, ur: NEGATIVE mg/dL
Leukocytes, UA: NEGATIVE
NITRITE: NEGATIVE
PH: 5 (ref 5.0–8.0)
Protein, ur: NEGATIVE mg/dL
SPECIFIC GRAVITY, URINE: 1.03 (ref 1.005–1.030)

## 2018-04-23 LAB — WET PREP, GENITAL
SPERM: NONE SEEN
Trich, Wet Prep: NONE SEEN
Yeast Wet Prep HPF POC: NONE SEEN

## 2018-04-23 LAB — POCT PREGNANCY, URINE: PREG TEST UR: NEGATIVE

## 2018-04-23 MED ORDER — METRONIDAZOLE 500 MG PO TABS: 500.0000 mg | ORAL_TABLET | Freq: Two times a day (BID) | ORAL | 0 refills | Status: DC

## 2018-04-23 MED ORDER — NYSTATIN-TRIAMCINOLONE 100000-0.1 UNIT/GM-% EX OINT
1.0000 | TOPICAL_OINTMENT | Freq: Two times a day (BID) | CUTANEOUS | 0 refills | Status: DC
Start: 2018-04-23 — End: 2023-01-30

## 2018-04-23 MED ORDER — FLUCONAZOLE 150 MG PO TABS: 150.0000 mg | ORAL_TABLET | Freq: Every day | ORAL | 0 refills | Status: DC

## 2018-04-23 NOTE — Discharge Instructions (Signed)
Bacterial Vaginosis °Bacterial vaginosis is an infection of the vagina. It happens when too many germs (bacteria) grow in the vagina. This infection puts you at risk for infections from sex (STIs). Treating this infection can lower your risk for some STIs. You should also treat this if you are pregnant. It can cause your baby to be born early. °Follow these instructions at home: °Medicines °· Take over-the-counter and prescription medicines only as told by your doctor. °· Take or use your antibiotic medicine as told by your doctor. Do not stop taking or using it even if you start to feel better. °General instructions °· If you your sexual partner is a woman, tell her that you have this infection. She needs to get treatment if she has symptoms. If you have a female partner, he does not need to be treated. °· During treatment: °? Avoid sex. °? Do not douche. °? Avoid alcohol as told. °? Avoid breastfeeding as told. °· Drink enough fluid to keep your pee (urine) clear or pale yellow. °· Keep your vagina and butt (rectum) clean. °? Wash the area with warm water every day. °? Wipe from front to back after you use the toilet. °· Keep all follow-up visits as told by your doctor. This is important. °Preventing this condition °· Do not douche. °· Use only warm water to wash around your vagina. °· Use protection when you have sex. This includes: °? Latex condoms. °? Dental dams. °· Limit how many people you have sex with. It is best to only have sex with the same person (be monogamous). °· Get tested for STIs. Have your partner get tested. °· Wear underwear that is cotton or lined with cotton. °· Avoid tight pants and pantyhose. This is most important in summer. °· Do not use any products that have nicotine or tobacco in them. These include cigarettes and e-cigarettes. If you need help quitting, ask your doctor. °· Do not use illegal drugs. °· Limit how much alcohol you drink. °Contact a doctor if: °· Your symptoms do not get  better, even after you are treated. °· You have more discharge or pain when you pee (urinate). °· You have a fever. °· You have pain in your belly (abdomen). °· You have pain with sex. °· Your bleed from your vagina between periods. °Summary °· This infection happens when too many germs (bacteria) grow in the vagina. °· Treating this condition can lower your risk for some infections from sex (STIs). °· You should also treat this if you are pregnant. It can cause early (premature) birth. °· Do not stop taking or using your antibiotic medicine even if you start to feel better. °This information is not intended to replace advice given to you by your health care provider. Make sure you discuss any questions you have with your health care provider. °Document Released: 08/14/2008 Document Revised: 07/21/2016 Document Reviewed: 07/21/2016 °Elsevier Interactive Patient Education © 2017 Elsevier Inc. ° °In late 2019, the [facility] will be moving to the [place] [facility]. At that time, the MAU (Maternity Admissions Unit), where you are being seen today, will no longer take care of non-pregnant patients. We strongly encourage you to find a doctor's office before that time, so that you can be seen with any GYN concerns, like vaginal discharge, urinary tract infection, etc.. in a timely manner. ° °In order to make an office visit more convenient, the [facility] will be offering evening hours with same-day   appointments, walk-in appointments and scheduled appointments available during this time. ° °[facility] Hours: °Monday - 8am - 7:30 pm with walk-in between 4pm- 7:30 pm °Tuesday - 8 am - 5 pm (starting 02/18/18 we will be open late and accepting walk-ins from 4pm - 7:30pm) °Wednesday - 8 am - 5 pm (starting 05/21/18 we will be open late and accepting walk-ins from 4pm - 7:30pm) °Thursday 8 am - 5 pm (starting 08/21/18 we will be open late and  accepting walk-ins from 4pm - 7:30pm) °Friday 8 am - 5 pm ° °For an appointment please call the [facility] at [phone] ° °For urgent needs, [facility] is also available for management of urgent GYN complaints such as vaginal discharge or urinary tract infections. ° ° ° ° ° °

## 2018-04-23 NOTE — MAU Provider Note (Signed)
History     CSN: [id number]  Arrival date and time: 04/23/18 1424   None     Chief Complaint  Patient presents with  . Vaginal Discharge  . Vaginal Itching   HPI   Ms.Carmen Watts is a 37 y.o. female non pregnant female here with vaginal discharge and vaginal itching. Says she has chronic BV. The symptoms started last week. She took diflucan which did not help her symptoms. She has white vaginal discharge with odor. Feels like she wants to itch all the time. The symptoms last all throughout the day and night. Was unable to get into the office. No bleeding.   OB History    Gravida  6   Para  4   Term  4   Preterm  0   AB  2   Living  4     SAB  0   TAB  2   Ectopic  0   Multiple  0   Live Births  4           Past Medical History:  Diagnosis Date  . Asthma   . Chlamydia   . Diabetes mellitus   . Gonorrhea   . Headache(784.0)   . Hypertension   . Ovarian cyst     Past Surgical History:  Procedure Laterality Date  . CESAREAN SECTION     x3  . CESAREAN SECTION N/A 11/11/2015   Procedure: CESAREAN SECTION;  Surgeon: Truett Mainland, DO;  Location: [facility];  Service: Obstetrics;  Laterality: N/A;  . THERAPEUTIC ABORTION    . TUBAL LIGATION      Family History  Problem Relation Age of Onset  . Thyroid disease Mother   . Hypertension Father   . Ovarian cancer Maternal Grandmother   . Breast cancer Maternal Grandmother   . Cancer Maternal Grandmother   . Cancer Maternal Grandfather   . Lung cancer Maternal Grandfather   . Asthma Son     Social History   Tobacco Use  . Smoking status: Former Smoker    Last attempt to quit: 12/18/2013    Years since quitting: 4.3  . Smokeless tobacco: Never Used  Substance Use Topics  . Alcohol use: Yes    Comment: social less than once per week   . Drug use: No    Allergies:  Allergies  Allergen Reactions  . Other Shortness Of Breath and Itching    Fresh fruit    Medications Prior to Admission    Medication Sig Dispense Refill Last Dose  . albuterol (PROVENTIL HFA;VENTOLIN HFA) 108 (90 Base) MCG/ACT inhaler Inhale 2 puffs into the lungs every 4 (four) hours as needed for wheezing or shortness of breath (cough). 1 Inhaler 0 More than a month at Unknown time  . Blood Glucose Monitoring Suppl (ACCU-CHEK NANO SMARTVIEW) W/DEVICE KIT 1 Units by Does not apply route 3 (three) times daily. 1 kit 0 02/14/2018 at Unknown time  . glucose blood test strip Use as instructed 120 each 12 02/14/2018 at Unknown time  . lisinopril (PRINIVIL,ZESTRIL) 5 MG tablet Take 5 mg by mouth daily.   02/14/2018 at Unknown time  . [DISCONTINUED] fluconazole (DIFLUCAN) 150 MG tablet Take 1 tablet (150 mg total) by mouth daily. 1 tablet 1 Past Month at Unknown time   Results for orders placed or performed during the hospital encounter of 04/23/18 (from the past 48 hour(s))  Urinalysis, Routine w reflex microscopic     Status: Abnormal   Collection  Time: 04/23/18  2:46 PM  Result Value Ref Range   Color, Urine YELLOW YELLOW   APPearance CLEAR CLEAR   Specific Gravity, Urine 1.030 1.005 - 1.030   pH 5.0 5.0 - 8.0   Glucose, UA >=500 (A) NEGATIVE mg/dL   Hgb urine dipstick NEGATIVE NEGATIVE   Bilirubin Urine NEGATIVE NEGATIVE   Ketones, ur NEGATIVE NEGATIVE mg/dL   Protein, ur NEGATIVE NEGATIVE mg/dL   Nitrite NEGATIVE NEGATIVE   Leukocytes, UA NEGATIVE NEGATIVE   RBC / HPF 0-5 0 - 5 RBC/hpf   WBC, UA 6-10 0 - 5 WBC/hpf   Bacteria, UA RARE (A) NONE SEEN   Squamous Epithelial / LPF 0-5 0 - 5    Comment: Performed at [facility], [address], [place], [place] [address]  Pregnancy, urine POC     Status: None   Collection Time: 04/23/18  3:15 PM  Result Value Ref Range   Preg Test, Ur NEGATIVE NEGATIVE    Comment:        THE SENSITIVITY OF THIS METHODOLOGY IS >24 mIU/mL   Wet prep, genital     Status: Abnormal   Collection Time: 04/23/18  3:33 PM  Result Value Ref Range   Yeast Wet Prep HPF POC NONE  SEEN NONE SEEN   Trich, Wet Prep NONE SEEN NONE SEEN   Clue Cells Wet Prep HPF POC PRESENT (A) NONE SEEN   WBC, Wet Prep HPF POC FEW (A) NONE SEEN    Comment: Swab received with less than 0.5 mL of saline, saline added to specimen, interpret results with caution. MANY BACTERIA SEEN    Sperm NONE SEEN     Comment: Performed at [facility], [address], [place], [place] [address]    Review of Systems  Constitutional: Negative for fever.  Gastrointestinal: Negative for abdominal pain, nausea and vomiting.  Genitourinary: Positive for vaginal discharge. Negative for dysuria, vaginal bleeding and vaginal pain.  Neurological: Negative for dizziness.   Physical Exam   Blood pressure 131/87, pulse (!) 104, temperature 98.5 F (36.9 C), temperature source Oral, resp. rate 16, weight 271 lb (122.9 kg), last menstrual period 03/25/2018, SpO2 100 %.  Physical Exam  Constitutional: She is oriented to person, place, and time. She appears well-developed and well-nourished. No distress.  HENT:  Head: Normocephalic.  Eyes: Pupils are equal, round, and reactive to light.  Genitourinary:     Genitourinary Comments: Vaginal irritation and skin breakdown noted. Labia appears pale. No lesions, + odor.   Musculoskeletal: Normal range of motion.  Neurological: She is alert and oriented to person, place, and time.  Skin: Skin is warm. She is not diaphoretic.  Psychiatric: Her behavior is normal.    MAU Course  Procedures  None  MDM  Wet prep and GC collected by RN UA  Assessment and Plan   A:  1. BV (bacterial vaginosis)   2. Vaginal irritation     P:  Discharge home in stable condition Return to MAU for emergencies only Rx: Flagyl, diflucan to take when Flagyl is complete, mycolog for external use Follow up with GYN for chronic Flagyl Do not use alcohol while taking flagyl  Rasch, Artist Pais, NP 04/24/2018 10:07 AM

## 2018-04-23 NOTE — MAU Note (Addendum)
Thinks she has a yeast infection. Doesn't know if it because her sugars are high or what. (fasting today was 286) Last time she had vaginosis.  Has a d/c with itching and burning.  Took diflucan last wk, no help.  Tried to get into dr's office, was unable.

## 2018-04-24 LAB — GC/CHLAMYDIA PROBE AMP (~~LOC~~) NOT AT ARMC
CHLAMYDIA, DNA PROBE: NEGATIVE
Neisseria Gonorrhea: NEGATIVE

## 2018-05-01 ENCOUNTER — Ambulatory Visit: Payer: 59 | Admitting: Obstetrics

## 2018-05-13 ENCOUNTER — Ambulatory Visit: Payer: 59 | Admitting: Obstetrics

## 2018-05-27 ENCOUNTER — Ambulatory Visit: Payer: 59 | Admitting: Obstetrics

## 2018-06-04 ENCOUNTER — Ambulatory Visit: Payer: 59 | Admitting: Obstetrics

## 2018-06-26 ENCOUNTER — Encounter: Payer: Self-pay | Admitting: Obstetrics

## 2018-06-26 ENCOUNTER — Ambulatory Visit (INDEPENDENT_AMBULATORY_CARE_PROVIDER_SITE_OTHER): Payer: 59 | Admitting: Obstetrics

## 2018-06-26 ENCOUNTER — Other Ambulatory Visit: Payer: Self-pay

## 2018-06-26 ENCOUNTER — Other Ambulatory Visit (HOSPITAL_COMMUNITY)
Admission: RE | Admit: 2018-06-26 | Discharge: 2018-06-26 | Disposition: A | Discharge status: Home / Self Care | Payer: 59 | Source: Ambulatory Visit | Attending: Obstetrics | Admitting: Obstetrics

## 2018-06-26 VITALS — BP 142/92 | HR 105 | Ht 63.0 in | Wt 271.0 lb

## 2018-06-26 DIAGNOSIS — Z6841 Body Mass Index (BMI) 40.0 and over, adult: Secondary | ICD-10-CM

## 2018-06-26 DIAGNOSIS — B373 Candidiasis of vulva and vagina: Secondary | ICD-10-CM

## 2018-06-26 DIAGNOSIS — Z01419 Encounter for gynecological examination (general) (routine) without abnormal findings: Secondary | ICD-10-CM | POA: Diagnosis present

## 2018-06-26 DIAGNOSIS — E139 Other specified diabetes mellitus without complications: Secondary | ICD-10-CM

## 2018-06-26 DIAGNOSIS — E66813 Obesity, class 3: Secondary | ICD-10-CM

## 2018-06-26 DIAGNOSIS — N760 Acute vaginitis: Secondary | ICD-10-CM

## 2018-06-26 DIAGNOSIS — B3731 Acute candidiasis of vulva and vagina: Secondary | ICD-10-CM

## 2018-06-26 DIAGNOSIS — B9689 Other specified bacterial agents as the cause of diseases classified elsewhere: Secondary | ICD-10-CM

## 2018-06-26 DIAGNOSIS — N898 Other specified noninflammatory disorders of vagina: Secondary | ICD-10-CM

## 2018-06-26 DIAGNOSIS — I10 Essential (primary) hypertension: Secondary | ICD-10-CM

## 2018-06-26 MED ORDER — METRONIDAZOLE 0.75 % VA GEL: 1.0000 | Freq: Two times a day (BID) | VAGINAL | 6 refills | Status: DC

## 2018-06-26 MED ORDER — CLOTRIMAZOLE 1 % EX CREA: 1.0000 "application " | TOPICAL_CREAM | Freq: Two times a day (BID) | CUTANEOUS | 2 refills | Status: DC

## 2018-06-26 MED ORDER — FLUCONAZOLE 200 MG PO TABS: 200.0000 mg | ORAL_TABLET | ORAL | 2 refills | Status: DC

## 2018-06-26 NOTE — Progress Notes (Signed)
New GYN presents for AEX/PAP/Cultures.  C/o discharge, itching and odor x 6-8 months. She has been taking the Flagyl but it is not working. She has a family history of breast Cancer; Maternal grand/great grand parents died of Breast Cancer and her second cousins are in remission.  BP elevated 142/92 Pulse 105. She is taking Lisinopril and is also Diabetic.

## 2018-06-26 NOTE — Progress Notes (Signed)
Subjective:        Carmen Watts is a 37 y.o. female here for a routine exam.  Current complaints: Recurrent BV and yeast infections.    Personal health questionnaire:  Is patient Ashkenazi Jewish, have a family history of breast and/or ovarian cancer: yes, Breast CA Is there a family history of uterine cancer diagnosed at age < 60, gastrointestinal cancer, urinary tract cancer, family member who is a Field seismologist syndrome-associated carrier: no Is the patient overweight and hypertensive, family history of diabetes, personal history of gestational diabetes, preeclampsia or PCOS: no Is patient over 60, have PCOS,  family history of premature CHD under age 61, diabetes, smoke, have hypertension or peripheral artery disease:  no At any time, has a partner hit, kicked or otherwise hurt or frightened you?: no Over the past 2 weeks, have you felt down, depressed or hopeless?: no Over the past 2 weeks, have you felt little interest or pleasure in doing things?:no   Gynecologic History Patient's last menstrual period was 05/26/2018 (approximate). Contraception: tubal ligation Last Pap: 2015. Results were: normal Last mammogram: n/a. Results were: n/a  Obstetric History OB History  Gravida Para Term Preterm AB Living  6 4 4  0 2 4  SAB TAB Ectopic Multiple Live Births  0 2 0 0 4    # Outcome Date GA Lbr Len/2nd Weight Sex Delivery Anes PTL Lv  6 Term 11/11/15 [redacted]w[redacted]d 7 lb 10.4 oz (3.47 kg) F CS-Vac Spinal, EPI  LIV  5 TAB 2015          4 Term 01/09/07 [id number] lb 2 oz (3.232 kg) M CS-Unspec Spinal  LIV  3 Term 05/30/03 3848w0d lb 8.5 oz (2.963 kg) F CS-Unspec Spinal N LIV  2 Term 11/11/01 [id number] 9 oz (2.977 kg) M CS-Unspec Spinal N LIV  1 TAB             Past Medical History:  Diagnosis Date  . Asthma   . Chlamydia   . Diabetes mellitus   . Gonorrhea   . Headache(784.0)   . Hypertension   . Ovarian cyst     Past Surgical History:  Procedure Laterality Date  . CESAREAN  SECTION     x3  . CESAREAN SECTION N/A 11/11/2015   Procedure: CESAREAN SECTION;  Surgeon: JacoTruett Mainland;  [facility];  Service: Obstetrics;  Laterality: N/A;  . THERAPEUTIC ABORTION    . TUBAL LIGATION       Current Outpatient Medications:  .  albuterol (PROVENTIL HFA;VENTOLIN HFA) 108 (90 Base) MCG/ACT inhaler, Inhale 2 puffs into the lungs every 4 (four) hours as needed for wheezing or shortness of breath (cough)., Disp: 1 Inhaler, Rfl: 0 .  Blood Glucose Monitoring Suppl (ACCU-CHEK NANO SMARTVIEW) W/DEVICE KIT, 1 Units by Does not apply route 3 (three) times daily., Disp: 1 kit, Rfl: 0 .  clotrimazole (LOTRIMIN) 1 % cream, Apply 1 application topically 2 (two) times daily., Disp: 113 g, Rfl: 2 .  fluconazole (DIFLUCAN) 200 MG tablet, Take 1 tablet (200 mg total) by mouth every 3 (three) days., Disp: 3 tablet, Rfl: 2 .  glucose blood test strip, Use as instructed, Disp: 120 each, Rfl: 12 .  lisinopril (PRINIVIL,ZESTRIL) 5 MG tablet, Take 5 mg by mouth daily., Disp: , Rfl:  .  metroNIDAZOLE (FLAGYL) 500 MG tablet, Take 1 tablet (500 mg total) by mouth 2 (two) times daily., Disp: 14 tablet, Rfl: 0 .  metroNIDAZOLE (METROGEL VAGINAL) 0.75 % vaginal gel, Place 1 Applicatorful vaginally 2 (two) times daily., Disp: 70 g, Rfl: 6 .  nystatin-triamcinolone ointment (MYCOLOG), Apply 1 application topically 2 (two) times daily., Disp: 30 g, Rfl: 0 Allergies  Allergen Reactions  . Other Shortness Of Breath and Itching    Fresh fruit    Social History   Tobacco Use  . Smoking status: Former Smoker    Last attempt to quit: 12/18/2013    Years since quitting: 4.5  . Smokeless tobacco: Never Used  Substance Use Topics  . Alcohol use: Yes    Comment: social less than once per week     Family History  Problem Relation Age of Onset  . Thyroid disease Mother   . Hypertension Father   . Ovarian cancer Maternal Grandmother   . Breast cancer Maternal Grandmother   . Cancer Maternal  Grandmother   . Cancer Maternal Grandfather   . Lung cancer Maternal Grandfather   . Asthma Son       Review of Systems  Constitutional: negative for fatigue and weight loss Respiratory: negative for cough and wheezing Cardiovascular: negative for chest pain, fatigue and palpitations Gastrointestinal: negative for abdominal pain and change in bowel habits Musculoskeletal:negative for myalgias Neurological: negative for gait problems and tremors Behavioral/Psych: negative for abusive relationship, depression Endocrine: negative for temperature intolerance    Genitourinary:negative for abnormal menstrual periods, genital lesions, hot flashes, sexual problems.  POSITIVE for malodorous vaginal discharge and vaginal itching on inside and outside in groin Integument/breast: negative for breast lump, breast tenderness, nipple discharge and skin lesion(s)    Objective:       BP (!) 142/92   Pulse (!) 105   Ht 5' 3"  (1.6 m)   Wt 271 lb (122.9 kg)   LMP 05/26/2018 (Approximate)   BMI 48.01 kg/m  General:   alert  Skin:   no rash or abnormalities  Lungs:   clear to auscultation bilaterally  Heart:   regular rate and rhythm, S1, S2 normal, no murmur, click, rub or gallop  Breasts:   normal without suspicious masses, skin or nipple changes or axillary nodes  Abdomen:  normal findings: no organomegaly, soft, non-tender and no hernia  Pelvis:  External genitalia: normal general appearance Urinary system: urethral meatus normal and bladder without fullness, nontender Vaginal: normal without tenderness, induration or masses Cervix: normal appearance Adnexa: normal bimanual exam Uterus: anteverted and non-tender, normal size   Lab Review Urine pregnancy test Labs reviewed yes Radiologic studies reviewed yes  50% of 20 min visit spent on counseling and coordination of care.   Assessment:     1. Encounter for routine gynecological examination with Papanicolaou smear of cervix Rx: -  Cytology - PAP  2. Vaginal discharge Rx: - Cervicovaginal ancillary only  3. Candida vaginitis Rx: - fluconazole (DIFLUCAN) 200 MG tablet; Take 1 tablet (200 mg total) by mouth every 3 (three) days.  Dispense: 3 tablet; Refill: 2  4. BV (bacterial vaginosis) Rx; - metroNIDAZOLE (METROGEL VAGINAL) 0.75 % vaginal gel; Place 1 Applicatorful vaginally 2 (two) times daily.  Use monthly after the period for 6 months.  Dispense: 70 g; Refill: 6  5. Candidal vulvitis Rx: - clotrimazole (LOTRIMIN) 1 % cream; Apply 1 application topically 2 (two) times daily.  Dispense: 113 g; Refill: 2  6. Class 3 severe obesity due to excess calories without serious comorbidity with body mass index (BMI) of 45.0 to 49.9 in adult [facility]) - program of caloric reduction,  exercise and behavioral modification recommended  7. 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 HTN (hypertension), benign - managed by PCP    Plan:    Education reviewed: calcium supplements, depression evaluation, low fat, low cholesterol diet, safe sex/STD prevention, self breast exams and weight bearing exercise. Follow up in: 6 months.   Meds ordered this encounter  Medications  . clotrimazole (LOTRIMIN) 1 % cream    Sig: Apply 1 application topically 2 (two) times daily.    Dispense:  113 g    Refill:  2  . fluconazole (DIFLUCAN) 200 MG tablet    Sig: Take 1 tablet (200 mg total) by mouth every 3 (three) days.    Dispense:  3 tablet    Refill:  2  . metroNIDAZOLE (METROGEL VAGINAL) 0.75 % vaginal gel    Sig: Place 1 Applicatorful vaginally 2 (two) times daily.    Dispense:  70 g    Refill:  6   No orders of the defined types were placed in this encounter.   Shelly Bombard MD 06-26-2018

## 2018-06-27 LAB — CERVICOVAGINAL ANCILLARY ONLY
BACTERIAL VAGINITIS: NEGATIVE
CHLAMYDIA, DNA PROBE: NEGATIVE
Candida vaginitis: POSITIVE — AB
NEISSERIA GONORRHEA: NEGATIVE
Trichomonas: NEGATIVE

## 2018-06-28 ENCOUNTER — Other Ambulatory Visit: Payer: Self-pay | Admitting: Obstetrics

## 2018-07-01 LAB — CYTOLOGY - PAP
DIAGNOSIS: NEGATIVE
HPV (WINDOPATH): NOT DETECTED

## 2019-01-09 ENCOUNTER — Emergency Department (HOSPITAL_COMMUNITY)
Admission: EM | Admit: 2019-01-09 | Discharge: 2019-01-09 | Disposition: A | Discharge status: Home / Self Care | Payer: 59 | Attending: Emergency Medicine | Admitting: Emergency Medicine

## 2019-01-09 ENCOUNTER — Encounter (HOSPITAL_COMMUNITY): Payer: Self-pay | Admitting: Emergency Medicine

## 2019-01-09 ENCOUNTER — Other Ambulatory Visit: Payer: Self-pay

## 2019-01-09 DIAGNOSIS — Z79899 Other long term (current) drug therapy: Secondary | ICD-10-CM | POA: Insufficient documentation

## 2019-01-09 DIAGNOSIS — J45909 Unspecified asthma, uncomplicated: Secondary | ICD-10-CM | POA: Insufficient documentation

## 2019-01-09 DIAGNOSIS — I10 Essential (primary) hypertension: Secondary | ICD-10-CM | POA: Diagnosis not present

## 2019-01-09 DIAGNOSIS — E119 Type 2 diabetes mellitus without complications: Secondary | ICD-10-CM | POA: Insufficient documentation

## 2019-01-09 DIAGNOSIS — Z87891 Personal history of nicotine dependence: Secondary | ICD-10-CM | POA: Insufficient documentation

## 2019-01-09 DIAGNOSIS — R05 Cough: Secondary | ICD-10-CM

## 2019-01-09 DIAGNOSIS — R059 Cough, unspecified: Secondary | ICD-10-CM

## 2019-01-09 MED ORDER — BENZONATATE 100 MG PO CAPS: 100.0000 mg | ORAL_CAPSULE | Freq: Three times a day (TID) | ORAL | 0 refills | Status: DC

## 2019-01-09 NOTE — ED Triage Notes (Signed)
Pt reports having a cough and chest tightness for over a month. Pt went to her PCP and was told to monitor her cough. Pt reports using her inhalers with no relief.

## 2019-01-09 NOTE — ED Provider Notes (Signed)
[facility] [facility] EMERGENCY DEPARTMENT Provider Note   CSN: [id number] Arrival date & time: 01/09/19  2426    History   Chief Complaint Chief Complaint  Patient presents with  . Cough    HPI Carmen Watts is a 38 y.o. female.     The history is provided by the patient and medical records. No language interpreter was used.  Cough     38 year old female with history of asthma, diabetes, hypertension presenting for evaluation of cough.  Patient report for the past month she has had a recurrent cough.  Cough sometimes productive with white sputum.  Lately she noticed increasing cough with occasional posttussive emesis.  She also endorsing chest tightness with her cough.  She does not complain of any fever or chills, no runny nose sneezing sore throat or ear pain.  No nausea vomiting diarrhea.  No exertional chest pain or exertional shortness of breath.  She has been using over-the-counter cough medication without adequate relief.  She did follow-up with her PCP in regard to her cough 3 weeks ago but was told to "monitor my asthma".  She does use her inhaler on occasion, maybe once a week.  She denies any prior history of PE or DVT and does not have any significant risk factors.  Patient also denies any new environmental changes.  Patient however currently taking lisinopril and have been for the past year.  Past Medical History:  Diagnosis Date  . Asthma   . Chlamydia   . Diabetes mellitus   . Gonorrhea   . Headache(784.0)   . Hypertension   . Ovarian cyst     Patient Active Problem List   Diagnosis Date Noted  . Preeclampsia in postpartum period 11/25/2015  . Diabetes type 2, controlled ([place]) 04/26/2014  . RUQ pain 07/28/2012  . 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 Neuropathic pain 10/17/2011    Past Surgical History:  Procedure Laterality Date  . CESAREAN SECTION     x3  . CESAREAN SECTION N/A 11/11/2015   Procedure: CESAREAN SECTION;   Surgeon: Truett Mainland, DO;  Location: [facility];  Service: Obstetrics;  Laterality: N/A;  . THERAPEUTIC ABORTION    . TUBAL LIGATION       OB History    Gravida  6   Para  4   Term  4   Preterm  0   AB  2   Living  4     SAB  0   TAB  2   Ectopic  0   Multiple  0   Live Births  4            Home Medications    Prior to Admission medications   Medication Sig Start Date End Date Taking? Authorizing Provider  albuterol (PROVENTIL HFA;VENTOLIN HFA) 108 (90 Base) MCG/ACT inhaler Inhale 2 puffs into the lungs every 4 (four) hours as needed for wheezing or shortness of breath (cough). 11/19/16   Street, [place], PA-C  Blood Glucose Monitoring Suppl (ACCU-CHEK NANO SMARTVIEW) W/DEVICE KIT 1 Units by Does not apply route 3 (three) times daily. 09/26/15   [name], MD  clotrimazole (LOTRIMIN) 1 % cream Apply 1 application topically 2 (two) times daily. 06/26/18   Shelly Bombard, MD  fluconazole (DIFLUCAN) 200 MG tablet Take 1 tablet (200 mg total) by mouth every 3 (three) days. 06/26/18   Shelly Bombard, MD  glucose blood test strip Use as instructed 08/15/15  [name], MD  lisinopril (PRINIVIL,ZESTRIL) 5 MG tablet Take 5 mg by mouth daily.    [provider]  metroNIDAZOLE (FLAGYL) 500 MG tablet Take 1 tablet (500 mg total) by mouth 2 (two) times daily. 04/23/18   Rasch, Anderson Malta I, NP  metroNIDAZOLE (METROGEL VAGINAL) 0.75 % vaginal gel Place 1 Applicatorful vaginally 2 (two) times daily. Use monthly after period x 6 months. 06/26/18   Shelly Bombard, MD  nystatin-triamcinolone ointment [facility]) Apply 1 application topically 2 (two) times daily. 04/23/18   Rasch, Artist Pais, NP    Family History Family History  Problem Relation Age of Onset  . Thyroid disease Mother   . Hypertension Father   . Ovarian cancer Maternal Grandmother   . Breast cancer Maternal Grandmother   . Cancer Maternal Grandmother   . Cancer Maternal Grandfather     . Lung cancer Maternal Grandfather   . Asthma Son     Social History Social History   Tobacco Use  . Smoking status: Former Smoker    Last attempt to quit: 12/18/2013    Years since quitting: 5.0  . Smokeless tobacco: Never Used  Substance Use Topics  . Alcohol use: Yes    Comment: social less than once per week   . Drug use: No     Allergies   Other   Review of Systems Review of Systems  Respiratory: Positive for cough.   All other systems reviewed and are negative.    Physical Exam Updated Vital Signs BP 117/84 (BP Location: Right Arm)   Pulse (!) 103   Temp 97.9 F (36.6 C) (Oral)   Resp 20   Ht 5' 4"  (1.626 m)   Wt 122.9 kg   SpO2 98%   BMI 46.51 kg/m   Physical Exam Vitals signs and nursing note reviewed.  Constitutional:      General: She is not in acute distress.    Appearance: She is well-developed. She is obese.  HENT:     Head: Atraumatic.     Right Ear: Tympanic membrane normal.     Left Ear: Tympanic membrane normal.     Nose: Nose normal.     Mouth/Throat:     Mouth: Mucous membranes are moist.  Eyes:     Conjunctiva/sclera: Conjunctivae normal.  Neck:     Musculoskeletal: Neck supple. No neck rigidity.  Cardiovascular:     Rate and Rhythm: Normal rate and regular rhythm.     Pulses: Normal pulses.     Heart sounds: Normal heart sounds.  Pulmonary:     Effort: Pulmonary effort is normal. No respiratory distress.     Breath sounds: Normal breath sounds. No stridor. No wheezing, rhonchi or rales.  Abdominal:     Palpations: Abdomen is soft.     Tenderness: There is no abdominal tenderness.  Lymphadenopathy:     Cervical: No cervical adenopathy.  Skin:    Capillary Refill: Capillary refill takes less than 2 seconds.     Findings: No rash.  Neurological:     Mental Status: She is alert and oriented to person, place, and time.      ED Treatments / Results  Labs (all labs ordered are listed, but only abnormal results are  displayed) Labs Reviewed - No data to display  EKG None  Radiology No results found.  Procedures Procedures (including critical care time)  Medications Ordered in ED Medications - No data to display   Initial Impression / Assessment and Plan /  ED Course  I have reviewed the triage vital signs and the nursing notes.  Pertinent labs & imaging results that were available during my care of the patient were reviewed by me and considered in my medical decision making (see chart for details).        BP 117/84 (BP Location: Right Arm)   Pulse (!) 103   Temp 97.9 F (36.6 C) (Oral)   Resp 20   Ht 5' 4"  (1.626 m)   Wt 122.9 kg   SpO2 98%   BMI 46.51 kg/m    Final Clinical Impressions(s) / ED Diagnoses   Final diagnoses:  Cough    ED Discharge Orders         Ordered    benzonatate (TESSALON) 100 MG capsule  Every 8 hours     01/09/19 0844         8:40 AM Patient here with a recurrent dry cough occasionally with white sputum ongoing for more than a month.  She does not have any symptoms to suggest infection.  She is currently not wheezing.  She is not hypoxic.  Does not have any significant risk factor for PE.  She is currently on an ACE inhibitor which may potentiate a cough.  Recommend patient to discontinue her lisinopril, follow-up closely with her PCP for medication change.  I will also add Tessalon Perles cough medication.  Return precaution discussed.   Domenic Moras, PA-C 01/09/19 [id number]    Charlesetta Shanks, MD 01/09/19 [phone]

## 2019-01-09 NOTE — ED Notes (Signed)
Patient verbalizes understanding of discharge instructions. Opportunity for questioning and answers were provided. Armband removed by staff, pt discharged from ED home via POV.  

## 2019-01-09 NOTE — Discharge Instructions (Addendum)
Your cough may be due to a side effect of your blood pressure medication, Lisinopril.  Please discontinue this medication and follow up with your doctor for change in blood pressure management.  Take Tessalon as needed for cough. Return if you have any concerns.

## 2019-06-19 ENCOUNTER — Ambulatory Visit: Payer: Self-pay

## 2019-11-06 ENCOUNTER — Other Ambulatory Visit: Payer: Self-pay

## 2021-05-31 ENCOUNTER — Ambulatory Visit: Payer: 59 | Admitting: Obstetrics

## 2021-09-26 ENCOUNTER — Encounter: Payer: Self-pay | Admitting: Advanced Practice Midwife

## 2021-09-26 ENCOUNTER — Other Ambulatory Visit (HOSPITAL_COMMUNITY)
Admission: RE | Admit: 2021-09-26 | Discharge: 2021-09-26 | Disposition: A | Discharge status: Home / Self Care | Payer: 59 | Source: Ambulatory Visit | Attending: Obstetrics | Admitting: Obstetrics

## 2021-09-26 ENCOUNTER — Ambulatory Visit (INDEPENDENT_AMBULATORY_CARE_PROVIDER_SITE_OTHER): Payer: 59 | Admitting: Advanced Practice Midwife

## 2021-09-26 ENCOUNTER — Other Ambulatory Visit: Payer: Self-pay

## 2021-09-26 VITALS — BP 142/90 | HR 106 | Ht 63.0 in | Wt 262.8 lb

## 2021-09-26 DIAGNOSIS — Z124 Encounter for screening for malignant neoplasm of cervix: Secondary | ICD-10-CM | POA: Diagnosis present

## 2021-09-26 DIAGNOSIS — Z113 Encounter for screening for infections with a predominantly sexual mode of transmission: Secondary | ICD-10-CM | POA: Diagnosis present

## 2021-09-26 DIAGNOSIS — N939 Abnormal uterine and vaginal bleeding, unspecified: Secondary | ICD-10-CM | POA: Diagnosis not present

## 2021-09-26 DIAGNOSIS — R030 Elevated blood-pressure reading, without diagnosis of hypertension: Secondary | ICD-10-CM

## 2021-09-26 DIAGNOSIS — A599 Trichomoniasis, unspecified: Secondary | ICD-10-CM

## 2021-09-26 DIAGNOSIS — Z01419 Encounter for gynecological examination (general) (routine) without abnormal findings: Secondary | ICD-10-CM | POA: Diagnosis not present

## 2021-09-26 NOTE — Progress Notes (Signed)
Subjective:     Carmen Watts is a 40 y.o. female here at [facility] for a routine exam.  Current complaints: heavy regular periods, recent trouble with blood sugar control with her medication on back-order and not available.  She is working with PCP on replacement medications. With recent elevated glucose values, she had s/sx of vaginal yeast infection but this has resolved with OTC topical treatment.  Personal health questionnaire reviewed: yes.  Do you have a primary care provider? yes Do you feel safe at home? yes  [place] Office Visit from 06/26/2018 in [place]  PHQ-2 Total Score 0       Health Maintenance Due  Topic Date Due   Pneumococcal Vaccine 80-88 Years old (1 - PCV) Never done   FOOT EXAM  Never done   OPHTHALMOLOGY EXAM  Never done   URINE MICROALBUMIN  Never done   Hepatitis C Screening  Never done   HEMOGLOBIN A1C  11/01/2015   COVID-19 Vaccine (3 - Booster for Pfizer series) 08/24/2020   INFLUENZA VACCINE  06/19/2021   PAP SMEAR-Modifier  06/26/2021     Risk factors for chronic health problems: Smoking: former Alchohol/how much: 1/week Pt BMI: Body mass index is 46.55 kg/m.   Gynecologic History Patient's last menstrual period was 09/06/2021 (approximate). Contraception: tubal ligation Last Pap: 2019. Results were: normal Last mammogram: n/a.  Obstetric History OB History  Gravida Para Term Preterm AB Living  6 4 4  0 2 4  SAB IAB Ectopic Multiple Live Births  0 2 0 0 4    # Outcome Date GA Lbr Len/2nd Weight Sex Delivery Anes PTL Lv  6 Term 11/11/15 [redacted]w[redacted]d  7 lb 10.4 oz (3.47 kg) F CS-Vac Spinal, EPI  LIV  5 IAB 2015          4 Term 01/09/07 [redacted]w[redacted]d  7 lb 2 oz (3.232 kg) M CS-Unspec Spinal  LIV  3 Term 05/30/03 [redacted]w[redacted]d  6 lb 8.5 oz (2.963 kg) F CS-Unspec Spinal N LIV  2 Term 11/11/01 [redacted]w[redacted]d  6 lb 9 oz (2.977 kg) M CS-Unspec Spinal N LIV  1 IAB              The following portions of the patient's history were  reviewed and updated as appropriate: allergies, current medications, past family history, past medical history, past social history, past surgical history, and problem list.  Review of Systems Pertinent items noted in HPI and remainder of comprehensive ROS otherwise negative.    Objective:   BP (!) 142/90   Pulse (!) 106   Ht 5\' 3"  (1.6 m)   Wt 262 lb 12.8 oz (119.2 kg)   LMP 09/06/2021 (Approximate)   BMI 46.55 kg/m  VS reviewed, nursing note reviewed,  Constitutional: well developed, well nourished, no distress HEENT: normocephalic CV: normal rate Pulm/chest wall: normal effort Breast Exam:   performed: right breast normal without mass, skin or nipple changes or axillary nodes, left breast normal without mass, skin or nipple changes or axillary nodes Abdomen: soft Neuro: alert and oriented x 3 Skin: warm, dry Psych: affect normal Pelvic exam: Performed: Cervix pink, visually closed, without lesion, scant white creamy discharge, vaginal walls and external genitalia normal Bimanual exam: Cervix 0/long/high, firm, anterior, neg CMT, uterus nontender, nonenlarged, adnexa without tenderness, enlargement, or mass       Assessment/Plan:   1. Routine screening for STI (sexually transmitted infection)  - Hepatitis B surface antigen - Hepatitis  C antibody - HIV Antibody (routine testing w rflx) - RPR - Cervicovaginal ancillary only  2. Cervical cancer screening --Last Pap 3 years ago, discussed with pt, plan to do Pap today, if normal, can be every 5 years per ASCCP guidelines - Cytology - PAP  3. Abnormal uterine bleeding (AUB) --Bleeding worsening in last 2 years --Discussed treatment options including IUD, and OCPs/Depo --Pt with hx fibroids, so will follow up with [place] --Pt plans to go out of town for work so will schedule when she returns and follow up in the office after [place]  - US PELVIC COMPLETE WITH TRANSVAGINAL; Future  4. Elevated blood pressure reading in office  without diagnosis of hypertension --Pt with PCP follow up   Follow up in: 1  year  or as needed.   Fatima Blank, CNM 11:39 AM

## 2021-09-27 LAB — CERVICOVAGINAL ANCILLARY ONLY
Chlamydia: NEGATIVE
Comment: NEGATIVE
Comment: NEGATIVE
Comment: NORMAL
Neisseria Gonorrhea: NEGATIVE
Trichomonas: POSITIVE — AB

## 2021-09-27 LAB — HEPATITIS B SURFACE ANTIGEN: Hepatitis B Surface Ag: NEGATIVE

## 2021-09-27 LAB — RPR: RPR Ser Ql: NONREACTIVE

## 2021-09-27 LAB — HEPATITIS C ANTIBODY: Hep C Virus Ab: <0.1 s/co ratio (ref 0.0–0.9)

## 2021-09-27 LAB — HIV ANTIBODY (ROUTINE TESTING W REFLEX): HIV Screen 4th Generation wRfx: NONREACTIVE

## 2021-09-29 LAB — CYTOLOGY - PAP
Comment: NEGATIVE
Diagnosis: NEGATIVE
Diagnosis: REACTIVE
High risk HPV: NEGATIVE

## 2021-09-30 MED ORDER — METRONIDAZOLE 500 MG PO TABS: 500.0000 mg | ORAL_TABLET | Freq: Two times a day (BID) | ORAL | 0 refills | Status: AC

## 2021-09-30 NOTE — Addendum Note (Signed)
Addended by: Fatima Blank A on: 09/30/2021 01:53 AM   Modules accepted: Orders

## 2021-10-09 ENCOUNTER — Encounter: Payer: Self-pay | Admitting: Advanced Practice Midwife

## 2021-10-17 ENCOUNTER — Other Ambulatory Visit: Payer: Self-pay

## 2021-10-17 DIAGNOSIS — B3731 Acute candidiasis of vulva and vagina: Secondary | ICD-10-CM

## 2021-10-17 DIAGNOSIS — N898 Other specified noninflammatory disorders of vagina: Secondary | ICD-10-CM

## 2021-10-17 MED ORDER — FLUCONAZOLE 200 MG PO TABS: 200.0000 mg | ORAL_TABLET | ORAL | 2 refills | Status: AC

## 2021-11-06 ENCOUNTER — Ambulatory Visit: Payer: 59

## 2023-01-30 ENCOUNTER — Encounter (HOSPITAL_COMMUNITY): Payer: Self-pay

## 2023-01-30 ENCOUNTER — Ambulatory Visit (HOSPITAL_COMMUNITY)
Admission: EM | Admit: 2023-01-30 | Discharge: 2023-01-30 | Disposition: A | Discharge status: Home / Self Care | Payer: BC Managed Care – PPO | Attending: Family Medicine | Admitting: Family Medicine

## 2023-01-30 DIAGNOSIS — S76011A Strain of muscle, fascia and tendon of right hip, initial encounter: Secondary | ICD-10-CM

## 2023-01-30 DIAGNOSIS — S39012A Strain of muscle, fascia and tendon of lower back, initial encounter: Secondary | ICD-10-CM

## 2023-01-30 DIAGNOSIS — M62830 Muscle spasm of back: Secondary | ICD-10-CM

## 2023-01-30 MED ORDER — HYDROCODONE-ACETAMINOPHEN 5-325 MG PO TABS: 1.0000 | ORAL_TABLET | Freq: Four times a day (QID) | ORAL | 0 refills | Status: AC | PRN

## 2023-01-30 MED ORDER — METHOCARBAMOL 500 MG PO TABS: 500.0000 mg | ORAL_TABLET | Freq: Two times a day (BID) | ORAL | 0 refills | Status: AC
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
## 2023-01-30 NOTE — Discharge Instructions (Signed)
Be aware, you have been prescribed pain medications that may cause drowsiness. While taking this medication, do not take any other medications containing acetaminophen (Tylenol). Do not combine with alcohol or recreational drugs. Please do not drive, operate heavy machinery, or take part in activities that require making important decisions while on this medication as your judgement may be clouded.  
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
## 2023-02-09 ENCOUNTER — Ambulatory Visit
Admission: EM | Admit: 2023-02-09 | Discharge: 2023-02-09 | Disposition: A | Discharge status: Home / Self Care | Payer: BC Managed Care – PPO | Attending: Internal Medicine | Admitting: Internal Medicine

## 2023-02-09 ENCOUNTER — Encounter: Payer: Self-pay | Admitting: Emergency Medicine

## 2023-02-09 DIAGNOSIS — J020 Streptococcal pharyngitis: Secondary | ICD-10-CM

## 2023-02-09 LAB — POCT RAPID STREP A (OFFICE): Rapid Strep A Screen: POSITIVE — AB

## 2023-02-09 MED ORDER — AMOXICILLIN 500 MG PO CAPS: 500.0000 mg | ORAL_CAPSULE | Freq: Two times a day (BID) | ORAL | 0 refills | Status: AC

## 2023-02-09 NOTE — ED Triage Notes (Signed)
Patient c/o sore throat x 2-3 days, no other sx's.  Patient has taken Ibuprofen.

## 2023-02-09 NOTE — ED Provider Notes (Addendum)
[facility]    CSN: [id number] Arrival date & time: 02/09/23  0911      History   Chief Complaint Chief Complaint  Patient presents with   Sore Throat    HPI Carmen Watts is a 42 y.o. female.   Patient presents with sore throat that has been present for 2 to 3 days.  Denies any other associated upper respiratory symptoms including nasal congestion, runny nose, cough, fever.  Denies any known sick contacts.  Patient does not report any medications for symptoms.   Sore Throat    Past Medical History:  Diagnosis Date   Asthma    Chlamydia    Diabetes mellitus    Gonorrhea    Headache(784.0)    Hypertension    Ovarian cyst     Patient Active Problem List   Diagnosis Date Noted   Preeclampsia in postpartum period 11/25/2015   Diabetes type 2, controlled ([place]) 04/26/2014   RUQ pain 07/28/2012   Liver mass, left lobe, probable adenoma 07/28/2012   Neuropathic pain 10/17/2011    Past Surgical History:  Procedure Laterality Date   CESAREAN SECTION     x3   CESAREAN SECTION N/A 11/11/2015   Procedure: CESAREAN SECTION;  Surgeon: Truett Mainland, DO;  Location: [facility];  Service: Obstetrics;  Laterality: N/A;   THERAPEUTIC ABORTION     TUBAL LIGATION      OB History     Gravida  6   Para  4   Term  4   Preterm  0   AB  2   Living  4      SAB  0   IAB  2   Ectopic  0   Multiple      Live Births  4            Home Medications    Prior to Admission medications   Medication Sig Start Date End Date Taking? Authorizing Provider  albuterol (PROVENTIL HFA;VENTOLIN HFA) 108 (90 Base) MCG/ACT inhaler Inhale 2 puffs into the lungs every 4 (four) hours as needed for wheezing or shortness of breath (cough). 11/19/16  Yes Street, [place], PA-C  amoxicillin (AMOXIL) 500 MG capsule Take 1 capsule (500 mg total) by mouth 2 (two) times daily for 10 days. 02/09/23 02/19/23 Yes Demarion Pondexter, Michele Rockers, FNP  Blood Glucose Monitoring Suppl (ACCU-CHEK  NANO SMARTVIEW) W/DEVICE KIT 1 Units by Does not apply route 3 (three) times daily. 09/26/15  Yes [name], MD  fluconazole (DIFLUCAN) 200 MG tablet Take 1 tablet (200 mg total) by mouth every 3 (three) days. 10/17/21  Yes Shelly Bombard, MD  glucose blood test strip Use as instructed 08/15/15  Yes [name], MD  HYDROcodone-acetaminophen (NORCO/VICODIN) 5-325 MG tablet Take 1 tablet by mouth every 6 (six) hours as needed for moderate pain or severe pain. 01/30/23  Yes Hagler, Aaron Edelman, MD  lisinopril (ZESTRIL) 5 MG tablet TAKE 1 TABLET DAILY (SCHEDULE A FOLLOW UP) 09/19/21  Yes [provider]  metFORMIN (GLUCOPHAGE-XR) 500 MG 24 hr tablet TAKE 2 TABLETS TWICE A DAY WITH MEALS (PLEASE SCHEDULE A FOLLOW UP) 09/19/21  Yes [provider]  methocarbamol (ROBAXIN) 500 MG tablet Take 1 tablet (500 mg total) by mouth 2 (two) times daily. 01/30/23  Yes Hagler, Aaron Edelman, MD  OZEMPIC, 1 MG/DOSE, 4 MG/3ML SOPN SMARTSIG:0.75 Milliliter(s) SUB-Q Once a Week 08/07/21  Yes [provider]  pioglitazone (ACTOS) 15 MG tablet Take 1 tablet by mouth  daily. 10/16/22  Yes [provider]    Family History Family History  Problem Relation Age of Onset   Thyroid disease Mother    Hypertension Father    Ovarian cancer Maternal Grandmother    Breast cancer Maternal Grandmother    Cancer Maternal Grandmother    Cancer Maternal Grandfather    Lung cancer Maternal Grandfather    Asthma Son     Social History Social History   Tobacco Use   Smoking status: Former    Types: Cigarettes    Quit date: 12/18/2013    Years since quitting: 9.1   Smokeless tobacco: Never  Vaping Use   Vaping Use: Never used  Substance Use Topics   Alcohol use: Yes    Comment: social less than once per week    Drug use: No     Allergies   Other and Sulfa antibiotics   Review of Systems Review of Systems Per HPI  Physical Exam Triage Vital Signs ED Triage Vitals  Enc  Vitals Group     BP 02/09/23 0922 137/87     Pulse Rate 02/09/23 0922 95     Resp 02/09/23 0922 18     Temp 02/09/23 0922 98.7 F (37.1 C)     Temp Source 02/09/23 0922 Oral     SpO2 02/09/23 0922 97 %     Weight 02/09/23 0924 260 lb (117.9 kg)     Height 02/09/23 0924 5\' 3"  (1.6 m)     Head Circumference --      Peak Flow --      Pain Score 02/09/23 0923 10     Pain Loc --      Pain Edu? --      Excl. in [place]? --    No data found.  Updated Vital Signs BP 137/87 (BP Location: Right Arm)   Pulse 95   Temp 98.7 F (37.1 C) (Oral)   Resp 18   Ht 5\' 3"  (1.6 m)   Wt 260 lb (117.9 kg)   LMP 01/08/2023 (Approximate)   SpO2 97%   BMI 46.06 kg/m   Visual Acuity Right Eye Distance:   Left Eye Distance:   Bilateral Distance:    Right Eye Near:   Left Eye Near:    Bilateral Near:     Physical Exam Constitutional:      General: She is not in acute distress.    Appearance: Normal appearance. She is not toxic-appearing or diaphoretic.  HENT:     Head: Normocephalic and atraumatic.     Right Ear: Tympanic membrane and ear canal normal.     Left Ear: Tympanic membrane and ear canal normal.     Nose: No congestion.     Mouth/Throat:     Mouth: Mucous membranes are moist.     Pharynx: Posterior oropharyngeal erythema present. No pharyngeal swelling, oropharyngeal exudate or uvula swelling.     Tonsils: No tonsillar exudate or tonsillar abscesses. 1+ on the right. 1+ on the left.  Eyes:     Extraocular Movements: Extraocular movements intact.     Conjunctiva/sclera: Conjunctivae normal.     Pupils: Pupils are equal, round, and reactive to light.  Cardiovascular:     Rate and Rhythm: Normal rate and regular rhythm.     Pulses: Normal pulses.     Heart sounds: Normal heart sounds.  Pulmonary:     Effort: Pulmonary effort is normal. No respiratory distress.     Breath sounds: Normal breath sounds.  No wheezing.  Abdominal:     General: Abdomen is flat. Bowel sounds are normal.      Palpations: Abdomen is soft.  Musculoskeletal:        General: Normal range of motion.     Cervical back: Normal range of motion.  Skin:    General: Skin is warm and dry.  Neurological:     General: No focal deficit present.     Mental Status: She is alert and oriented to person, place, and time. Mental status is at baseline.  Psychiatric:        Mood and Affect: Mood normal.        Behavior: Behavior normal.      UC Treatments / Results  Labs (all labs ordered are listed, but only abnormal results are displayed) Labs Reviewed  POCT RAPID STREP A (OFFICE) - Abnormal; Notable for the following components:      Result Value   Rapid Strep A Screen Positive (*)    All other components within normal limits    EKG   Radiology No results found.  Procedures Procedures (including critical care time)  Medications Ordered in UC Medications - No data to display  Initial Impression / Assessment and Plan / UC Course  I have reviewed the triage vital signs and the nursing notes.  Pertinent labs & imaging results that were available during my care of the patient were reviewed by me and considered in my medical decision making (see chart for details).     Rapid strep is positive.  Will treat with amoxicillin antibiotic.  No signs of peritonsillar abscess on exam.  Advised supportive care and symptom management.  Advised strict return precautions.  Patient reports that she has to take a preventative malarial medication due to impending travel to [place] starting on Monday.  She is concerned that antibiotic could interact with medication.  Advised patient that antibiotic should not interact with this medication.  Patient verbalized understanding and was agreeable with plan. Final Clinical Impressions(s) / UC Diagnoses   Final diagnoses:  Strep pharyngitis     Discharge Instructions      You have strep throat which is being treated with an antibiotic.  Follow-up if any  symptoms persist or worsen.    ED Prescriptions     Medication Sig Dispense Auth. Provider   amoxicillin (AMOXIL) 500 MG capsule Take 1 capsule (500 mg total) by mouth 2 (two) times daily for 10 days. 20 capsule Teodora Medici, [place]      PDMP not reviewed this encounter.   Teodora Medici, [place] 02/09/23 [address] [place] 02/09/23 1022

## 2023-02-09 NOTE — Discharge Instructions (Signed)
You have strep throat which is being treated with an antibiotic.  Follow-up if any symptoms persist or worsen. 

## 2023-04-02 ENCOUNTER — Emergency Department (HOSPITAL_COMMUNITY): Payer: BC Managed Care – PPO

## 2023-04-02 ENCOUNTER — Encounter (HOSPITAL_COMMUNITY): Payer: Self-pay

## 2023-04-02 ENCOUNTER — Other Ambulatory Visit: Payer: Self-pay

## 2023-04-02 ENCOUNTER — Emergency Department (HOSPITAL_COMMUNITY)
Admission: EM | Admit: 2023-04-02 | Discharge: 2023-04-02 | Disposition: A | Discharge status: Home / Self Care | Payer: BC Managed Care – PPO | Attending: Emergency Medicine | Admitting: Emergency Medicine

## 2023-04-02 DIAGNOSIS — J45909 Unspecified asthma, uncomplicated: Secondary | ICD-10-CM | POA: Diagnosis not present

## 2023-04-02 DIAGNOSIS — I10 Essential (primary) hypertension: Secondary | ICD-10-CM | POA: Diagnosis not present

## 2023-04-02 DIAGNOSIS — Z79899 Other long term (current) drug therapy: Secondary | ICD-10-CM | POA: Diagnosis not present

## 2023-04-02 DIAGNOSIS — E119 Type 2 diabetes mellitus without complications: Secondary | ICD-10-CM | POA: Insufficient documentation

## 2023-04-02 DIAGNOSIS — M25551 Pain in right hip: Secondary | ICD-10-CM | POA: Diagnosis present

## 2023-04-02 DIAGNOSIS — Z7984 Long term (current) use of oral hypoglycemic drugs: Secondary | ICD-10-CM | POA: Insufficient documentation

## 2023-04-02 LAB — I-STAT BETA HCG BLOOD, ED (MC, WL, AP ONLY): I-stat hCG, quantitative: <5 m[IU]/mL (ref <5)

## 2023-04-02 MED ORDER — DEXAMETHASONE SODIUM PHOSPHATE 10 MG/ML IJ SOLN
10.0000 mg | Freq: Once | INTRAMUSCULAR | Status: AC
  Administered 2023-04-02: 10 mg via INTRAMUSCULAR
  Filled 2023-04-02: qty 1

## 2023-04-02 MED ORDER — KETOROLAC TROMETHAMINE 15 MG/ML IJ SOLN
15.0000 mg | Freq: Once | INTRAMUSCULAR | Status: AC
  Administered 2023-04-02: 15 mg via INTRAMUSCULAR
  Filled 2023-04-02: qty 1

## 2023-04-02 MED ORDER — MELOXICAM 15 MG PO TABS: 15.0000 mg | ORAL_TABLET | Freq: Every day | ORAL | 0 refills | Status: AC

## 2023-04-02 NOTE — ED Provider Notes (Signed)
[place] EMERGENCY DEPARTMENT AT [facility] Provider Note   CSN: [id number] Arrival date & time: 04/02/23  1847     History  Chief Complaint  Patient presents with   Hip Pain    Carmen Watts is a 42 y.o. female.  Patient presents to the emergency department complaining of right-sided hip pain.  She denies any fall or injury to the area.  She states that the pain has been persistent for approximately 1 month and is worsened over the last 2 days.  She denies associated back pain or radicular symptoms.  Pain is described as being mostly in the right groin.  Worse with movement.  Patient denies seeing orthopedics for the problem in the past.  Past medical history significant for asthma, hypertension, type II DM, neuropathic pain  HPI     Home Medications Prior to Admission medications   Medication Sig Start Date End Date Taking? Authorizing Provider  meloxicam (MOBIC) 15 MG tablet Take 1 tablet (15 mg total) by mouth daily. 04/02/23 05/02/23 Yes Darrick Grinder, PA-C  albuterol (PROVENTIL HFA;VENTOLIN HFA) 108 (90 Base) MCG/ACT inhaler Inhale 2 puffs into the lungs every 4 (four) hours as needed for wheezing or shortness of breath (cough). 11/19/16   Street, [place], PA-C  Blood Glucose Monitoring Suppl (ACCU-CHEK NANO SMARTVIEW) W/DEVICE KIT 1 Units by Does not apply route 3 (three) times daily. 09/26/15   Federico Flake, MD  fluconazole (DIFLUCAN) 200 MG tablet Take 1 tablet (200 mg total) by mouth every 3 (three) days. 10/17/21   Brock Bad, MD  glucose blood test strip Use as instructed 08/15/15   Federico Flake, MD  HYDROcodone-acetaminophen (NORCO/VICODIN) 5-325 MG tablet Take 1 tablet by mouth every 6 (six) hours as needed for moderate pain or severe pain. 01/30/23   Mardella Layman, MD  lisinopril (ZESTRIL) 5 MG tablet TAKE 1 TABLET DAILY (SCHEDULE A FOLLOW UP) 09/19/21   [provider]  metFORMIN (GLUCOPHAGE-XR) 500 MG 24 hr tablet TAKE 2  TABLETS TWICE A DAY WITH MEALS (PLEASE SCHEDULE A FOLLOW UP) 09/19/21   [provider]  methocarbamol (ROBAXIN) 500 MG tablet Take 1 tablet (500 mg total) by mouth 2 (two) times daily. 01/30/23   Mardella Layman, MD  OZEMPIC, 1 MG/DOSE, 4 MG/3ML SOPN SMARTSIG:0.75 Milliliter(s) SUB-Q Once a Week 08/07/21   [provider]  pioglitazone (ACTOS) 15 MG tablet Take 1 tablet by mouth daily. 10/16/22   [provider]      Allergies    Other and Sulfa antibiotics    Review of Systems   Review of Systems  Physical Exam Updated Vital Signs BP (!) 140/86 (BP Location: Right Arm)   Pulse 93   Temp 98.4 F (36.9 C) (Oral)   Resp 17   Wt 124.7 kg   SpO2 100%   BMI 48.71 kg/m  Physical Exam Vitals and nursing note reviewed.  HENT:     Head: Normocephalic and atraumatic.  Eyes:     Pupils: Pupils are equal, round, and reactive to light.  Pulmonary:     Effort: Pulmonary effort is normal. No respiratory distress.  Musculoskeletal:        General: Tenderness present. No signs of injury.     Cervical back: Normal range of motion.     Comments: Patient with increased pain with passive range of motion of the right.  Patient with tenderness to palpation of the lateral right hip over the greater trochanter.  Skin:    General: Skin is dry.  Neurological:     Mental Status: She is alert.  Psychiatric:        Speech: Speech normal.        Behavior: Behavior normal.     ED Results / Procedures / Treatments   Labs (all labs ordered are listed, but only abnormal results are displayed) Labs Reviewed  I-STAT BETA HCG BLOOD, ED ([facility], [facility], [facility] ONLY)    EKG None  Radiology DG Hip Unilat W or Wo Pelvis 2-3 Views Right  Result Date: 04/02/2023 CLINICAL DATA:  Hip pain EXAM: DG HIP (WITH OR WITHOUT PELVIS) 3V RIGHT COMPARISON:  None Available. FINDINGS: No fracture or dislocation. Preserved joint spaces and bone mineralization. Partial sacralization of the left side of  L5. Tubal ligation clips in the pelvis. Tiny density in the left hemipelvis is indeterminate but could be vascular calcification. IMPRESSION: No acute osseous abnormality. Electronically Signed   By: Karen Kays M.D.   On: 04/02/2023 20:35    Procedures Procedures    Medications Ordered in ED Medications  dexamethasone (DECADRON) injection 10 mg (has no administration in time range)  ketorolac (TORADOL) 15 MG/ML injection 15 mg (has no administration in time range)    ED Course/ Medical Decision Making/ A&P                             Medical Decision Making Amount and/or Complexity of Data Reviewed Radiology: ordered.  Risk Prescription drug management.   This patient presents to the ED for concern of right-sided hip pain, this involves an extensive number of treatment options, and is a complaint that carries with it a high risk of complications and morbidity.  The differential diagnosis includes fracture, dislocation, soft tissue injury, sciatica, others   Co morbidities that complicate the patient evaluation  Neuropathic pain   Additional history obtained:  External records from outside source obtained and reviewed including urgent care notes from March of this year.  Patient mentioned right-sided hip pain at that time.  Was thought to have a strain of the lumbar region   Lab Tests:  I Ordered, and personally interpreted labs.  The pertinent results include: Negative i-STAT beta hCG pregnancy test   Imaging Studies ordered:  I ordered imaging studies including plain films of the right hip I independently visualized and interpreted imaging which showed no fracture or dislocation I agree with the radiologist interpretation   Problem List / ED Course / Critical interventions / Medication management   I ordered medication including Decadron and Toradol for inflammation Reevaluation of the patient after these medicines showed that the patient improved I have  reviewed the patients home medicines and have made adjustments as needed   Test / Admission - Considered:  Patient with no acute osseous abnormality on imaging.  Patient denies any radicular symptoms.  Patient denies back pain contributing to the symptoms at this time.  Plan to discharge patient home with anti-inflammatory medication and recommendations for follow-up with orthopedics.  Patient voices understanding with plan.  Discharge home.         Final Clinical Impression(s) / ED Diagnoses Final diagnoses:  Right hip pain    Rx / DC Orders ED Discharge Orders          Ordered    meloxicam (MOBIC) 15 MG tablet  Daily        04/02/23 2242  Pamala Duffel 04/02/23 2253    Terrilee Files, MD 04/03/23 [id number]

## 2023-04-02 NOTE — ED Triage Notes (Signed)
Pt arrives with c/o right hip pain that started about a month ago and has gotten worse over the last 2 days. Pt denies injury.

## 2023-04-02 NOTE — Discharge Instructions (Addendum)
You were evaluated today for right sided hip pain. Your x-rays were reassuring. Please take the prescribed anti-inflammatory medications and follow up with orthopedics as needed.
